# Patient Record
Sex: Male | Born: 1985 | Race: White | Hispanic: No | Marital: Single | Smoking: Former smoker
Health system: Southern US, Community
[De-identification: ages and names within clinical notes are randomized; demographics above are authoritative.]

## PROBLEM LIST (undated history)

## (undated) DIAGNOSIS — F209 Schizophrenia, unspecified: Secondary | ICD-10-CM

## (undated) DIAGNOSIS — F419 Anxiety disorder, unspecified: Secondary | ICD-10-CM

## (undated) DIAGNOSIS — F319 Bipolar disorder, unspecified: Secondary | ICD-10-CM

## (undated) HISTORY — DX: Anxiety disorder, unspecified: F41.9

---
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
## 2010-10-06 ENCOUNTER — Emergency Department (HOSPITAL_COMMUNITY)
Admission: EM | Admit: 2010-10-06 | Discharge: 2010-10-06 | Disposition: A | Discharge status: Home / Self Care | Payer: BC Managed Care – PPO | Attending: Emergency Medicine | Admitting: Emergency Medicine

## 2010-10-06 ENCOUNTER — Encounter (HOSPITAL_COMMUNITY): Payer: Self-pay

## 2010-10-06 ENCOUNTER — Emergency Department (HOSPITAL_COMMUNITY): Payer: BC Managed Care – PPO

## 2010-10-06 DIAGNOSIS — R197 Diarrhea, unspecified: Secondary | ICD-10-CM | POA: Insufficient documentation

## 2010-10-06 DIAGNOSIS — R1031 Right lower quadrant pain: Secondary | ICD-10-CM | POA: Insufficient documentation

## 2010-10-06 DIAGNOSIS — R110 Nausea: Secondary | ICD-10-CM | POA: Insufficient documentation

## 2010-10-06 LAB — DIFFERENTIAL
Basophils Absolute: 0.1 10*3/uL (ref 0.0–0.1)
Basophils Relative: 1 % (ref 0–1)
Eosinophils Absolute: 0 10*3/uL (ref 0.0–0.7)
Eosinophils Relative: 0 % (ref 0–5)
Lymphocytes Relative: 13 % (ref 12–46)
Lymphs Abs: 1.2 10*3/uL (ref 0.7–4.0)
Monocytes Absolute: 0.6 10*3/uL (ref 0.1–1.0)
Monocytes Relative: 6 % (ref 3–12)
Neutro Abs: 7.6 10*3/uL (ref 1.7–7.7)
Neutrophils Relative %: 80 % — ABNORMAL HIGH (ref 43–77)

## 2010-10-06 LAB — CBC
HCT: 45.6 % (ref 39.0–52.0)
Hemoglobin: 16.1 g/dL (ref 13.0–17.0)
MCH: 30.8 pg (ref 26.0–34.0)
MCHC: 35.3 g/dL (ref 30.0–36.0)
MCV: 87.2 fL (ref 78.0–100.0)
Platelets: 262 10*3/uL (ref 150–400)
RBC: 5.23 MIL/uL (ref 4.22–5.81)
RDW: 12.1 % (ref 11.5–15.5)
WBC: 9.5 10*3/uL (ref 4.0–10.5)

## 2010-10-06 LAB — URINALYSIS, ROUTINE W REFLEX MICROSCOPIC
Bilirubin Urine: NEGATIVE
Glucose, UA: NEGATIVE mg/dL
Hgb urine dipstick: NEGATIVE
Ketones, ur: 15 mg/dL — AB
Leukocytes, UA: NEGATIVE
Nitrite: NEGATIVE
Protein, ur: NEGATIVE mg/dL
Specific Gravity, Urine: 1.028 (ref 1.005–1.030)
Urobilinogen, UA: 0.2 mg/dL (ref 0.0–1.0)
pH: 6.5 (ref 5.0–8.0)

## 2010-10-06 LAB — COMPREHENSIVE METABOLIC PANEL
Albumin: 5.2 g/dL (ref 3.5–5.2)
BUN: 11 mg/dL (ref 6–23)
Calcium: 10.8 mg/dL — ABNORMAL HIGH (ref 8.4–10.5)
Creatinine, Ser: 0.81 mg/dL (ref 0.50–1.35)
GFR calc Af Amer: >60 mL/min (ref >60)
Potassium: 4.5 mEq/L (ref 3.5–5.1)
Total Protein: 8.2 g/dL (ref 6.0–8.3)

## 2010-10-06 MED ORDER — IOHEXOL 300 MG/ML  SOLN
100.0000 mL | Freq: Once | INTRAMUSCULAR | Status: AC | PRN
  Administered 2010-10-06: 100 mL via INTRAVENOUS

## 2010-10-19 ENCOUNTER — Encounter: Payer: Self-pay | Admitting: Internal Medicine

## 2010-10-19 ENCOUNTER — Ambulatory Visit (INDEPENDENT_AMBULATORY_CARE_PROVIDER_SITE_OTHER): Payer: BC Managed Care – PPO | Admitting: Internal Medicine

## 2010-10-19 VITALS — BP 124/62 | HR 88 | Ht 70.0 in | Wt 164.0 lb

## 2010-10-19 DIAGNOSIS — R1031 Right lower quadrant pain: Secondary | ICD-10-CM

## 2010-10-19 MED ORDER — METHYLCELLULOSE (LAXATIVE) PO POWD: 1.0000 | Freq: Every day | ORAL | Status: AC

## 2010-10-19 NOTE — Patient Instructions (Signed)
Please begin Citrucel 1 tablespoon daily for 2 weeks, if no better you may start Miralax 1 scoop daily in 8 oz of water. Increase your fluid intake. Follow up in 1 month.

## 2010-10-19 NOTE — Progress Notes (Signed)
Subjective:    Patient ID: Gilbert Meyers, male    DOB: 11/01/1985, 25 y.o.   MRN: [id number]  HPI Gilbert Meyers is a 25 year old male with no past medical history who presents for evaluation of intermittent right lower quadrant pain.  The patient reports approximately 3 weeks of right lower quadrant pain and change in bowel habits.  He reports intermittent sharp right lower quadrant pain. This has been occurring 2-3 times a day and lasts only for a few seconds. He does report a "dull ache" at times which is unpredictable and also short lived. He reports that this doesn't seem to be related to eating or to BM and there is no other aggravating or alleviating factors. The pain does not radiate. He has "a little" nausea but no vomiting. He reports his appetite has remained very good he's had no weight loss. He's had no fevers or chills. Regarding his bowel movements he states prior to 3 or 4 weeks ago he would go one to 2 times/day and his stools were brown and formed. He reports now having one stool per day or perhaps less and he notes a change in color to yellow. He also has noticed mucus in his stools. He reports his stools have been harder. No diarrhea. No rectal bleeding or melena.  No heartburn, no odynophagia or dysphagia. He also is noted no recent change in diet. He has not taken anything for his pain.  Review of Systems  Constitutional: Negative for fever, chills, activity change, appetite change, fatigue and unexpected weight change.  HENT: Negative for sore throat and trouble swallowing.   Eyes: Negative for visual disturbance.  Respiratory: Negative for cough, chest tightness and shortness of breath.   Cardiovascular: Negative for chest pain and leg swelling.  Gastrointestinal:       See HPI   Genitourinary: Negative for dysuria and difficulty urinating.  Musculoskeletal: Negative for myalgias and arthralgias.  Skin: Negative for color change and rash.  Neurological: Negative for weakness,  numbness and headaches.  Hematological: Does not bruise/bleed easily.  Psychiatric/Behavioral: Negative for dysphoric mood. The patient is nervous/anxious.        Anxiety related to change in BM/pain   SH: The patient is single. He recently quit smoking cigarettes about 4 months ago. Prior to this he smoked about 10 cigarettes per day for 7 years. He reports rare and inconsistent alcohol use. Denies illicit drug use. 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  FH: The patient reports a paternal aunt with colitis, a paternal grandfather with prostate cancer. No known history of colorectal cancer colon polyps.    Objective:   Physical Exam  Constitutional: He is oriented to person, place, and time. He appears well-developed and well-nourished. No distress.  HENT:  Head: Normocephalic and atraumatic.  Mouth/Throat: Oropharynx is clear and moist. No oropharyngeal exudate.  Eyes: Conjunctivae and EOM are normal. Pupils are equal, round, and reactive to light. No scleral icterus.  Neck: Neck supple.  Cardiovascular: Normal rate, regular rhythm and normal heart sounds.   Pulmonary/Chest: Effort normal and breath sounds normal. He has no wheezes.  Abdominal: Soft. Bowel sounds are normal. He exhibits no distension and no mass. There is no tenderness.  Lymphadenopathy:    He has no cervical adenopathy.  Neurological: He is alert and oriented to person, place, and time.  Skin: Skin is warm and dry. No rash noted.  Psychiatric: He has a normal mood and affect. His  behavior is normal.  BP 124/62  Pulse 88  Ht 5\' 10"  (1.778 m)  Wt 164 lb (74.39 kg)  BMI 23.53 kg/m2  Imaging reviewed: CT scan abdomen and pelvis 10/06/2010 Liver, spleen, pancreas, and adrenal glands are within normal  limits.  Gallbladder is unremarkable. No intrahepatic or extrahepatic  ductal dilatation.  Kidneys within normal limits. No hydronephrosis.  No evidence of bowel obstruction. Normal  appendix. Terminal ileum  appears within normal limits.  No abdominopelvic ascites. No suspicious abdominopelvic  lymphadenopathy.  No evidence of abdominal aortic aneurysm.  Prostate is unremarkable.  Bladder is within normal limits.  Visualized osseous structures are within normal limits.  IMPRESSION:  Normal appendix.  No evidence of bowel obstruction.  No CT findings to account for the patient's abdominal pain.  Labs reviewed: CMET     Component Value Date/Time   NA 139 10/06/2010 2030   K 4.5 10/06/2010 2030   CL 101 10/06/2010 2030   CO2 27 10/06/2010 2030   GLUCOSE 104* 10/06/2010 2030   BUN 11 10/06/2010 2030   CREATININE 0.81 10/06/2010 2030   CALCIUM 10.8* 10/06/2010 2030   PROT 8.2 10/06/2010 2030   ALBUMIN 5.2 10/06/2010 2030   AST 20 10/06/2010 2030   ALT 17 10/06/2010 2030   ALKPHOS 74 10/06/2010 2030   BILITOT 2.1* 10/06/2010 2030   GFRNONAA >60 10/06/2010 2030   GFRAA >60 10/06/2010 2030   CBC    Component Value Date/Time   WBC 9.5 10/06/2010 2030   RBC 5.23 10/06/2010 2030   HGB 16.1 10/06/2010 2030   HCT 45.6 10/06/2010 2030   PLT 262 10/06/2010 2030   MCV 87.2 10/06/2010 2030   MCH 30.8 10/06/2010 2030   MCHC 35.3 10/06/2010 2030   RDW 12.1 10/06/2010 2030   LYMPHSABS 1.2 10/06/2010 2030   MONOABS 0.6 10/06/2010 2030   EOSABS 0.0 10/06/2010 2030   BASOSABS 0.1 10/06/2010 2030      Assessment & Plan:  This is a 25 year old male with no past medical history presenting with intermittent right lower quadrant pain and hard stools with mucus.  1. Abd pain/change in stools - the repeat studies including labs and CT imaging are reassuring. At this point I think the most likely etiology is slow bowel transit and perhaps some pain due to constipation.  There are no alarm symptoms currently.  There may be a component of IBS but I feel it is too early to know for sure.  I will give him a trial of Citrucel daily and I have asked that he try to increase his fluid intake in an attempt to bulk his stools. I advised  her that if after 14 days he continues to have hard and less frequent stools that he should start MiraLAX 17 g daily. I will see him back in 4 weeks' time to gauge response. If there's been no improvement or symptoms change/worse then I will consider colonoscopy.  2. Elevated bilirubin - the patient had a slightly elevated bilirubin at 2.1 on 10/06/2010. He left today before getting labs, but we will recheck this at his next visit to ensure normalization. There is no jaundice currently and he denies dark urine.  If it remains elevated we will get a fractionated bilirubin as Gilbert's is possible.  Return in one month and call my office if symptoms change or worsen or if he has questions.

## 2012-06-15 ENCOUNTER — Emergency Department (HOSPITAL_COMMUNITY)
Admission: EM | Admit: 2012-06-15 | Discharge: 2012-06-15 | Disposition: A | Discharge status: Home / Self Care | Payer: Self-pay | Attending: Emergency Medicine | Admitting: Emergency Medicine

## 2012-06-15 ENCOUNTER — Encounter (HOSPITAL_COMMUNITY): Payer: Self-pay | Admitting: *Deleted

## 2012-06-15 DIAGNOSIS — F411 Generalized anxiety disorder: Secondary | ICD-10-CM | POA: Insufficient documentation

## 2012-06-15 DIAGNOSIS — F909 Attention-deficit hyperactivity disorder, unspecified type: Secondary | ICD-10-CM | POA: Insufficient documentation

## 2012-06-15 DIAGNOSIS — F419 Anxiety disorder, unspecified: Secondary | ICD-10-CM

## 2012-06-15 DIAGNOSIS — IMO0002 Reserved for concepts with insufficient information to code with codable children: Secondary | ICD-10-CM | POA: Insufficient documentation

## 2012-06-15 DIAGNOSIS — Z87891 Personal history of nicotine dependence: Secondary | ICD-10-CM | POA: Insufficient documentation

## 2012-06-15 MED ORDER — HYDROXYZINE HCL 10 MG PO TABS: 10.0000 mg | ORAL_TABLET | Freq: Four times a day (QID) | ORAL | Status: DC | PRN
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
## 2012-11-19 ENCOUNTER — Emergency Department (HOSPITAL_COMMUNITY)
Admission: EM | Admit: 2012-11-19 | Discharge: 2012-11-19 | Disposition: A | Discharge status: Home / Self Care | Payer: BC Managed Care – PPO | Attending: Emergency Medicine | Admitting: Emergency Medicine

## 2012-11-19 ENCOUNTER — Encounter (HOSPITAL_COMMUNITY): Payer: Self-pay | Admitting: *Deleted

## 2012-11-19 DIAGNOSIS — Z8659 Personal history of other mental and behavioral disorders: Secondary | ICD-10-CM | POA: Insufficient documentation

## 2012-11-19 DIAGNOSIS — H9209 Otalgia, unspecified ear: Secondary | ICD-10-CM | POA: Insufficient documentation

## 2012-11-19 DIAGNOSIS — H9201 Otalgia, right ear: Secondary | ICD-10-CM

## 2012-11-19 DIAGNOSIS — F17200 Nicotine dependence, unspecified, uncomplicated: Secondary | ICD-10-CM | POA: Insufficient documentation

## 2012-11-19 DIAGNOSIS — R599 Enlarged lymph nodes, unspecified: Secondary | ICD-10-CM | POA: Insufficient documentation

## 2012-11-19 MED ORDER — ANTIPYRINE-BENZOCAINE 5.4-1.4 % OT SOLN
3.0000 [drp] | Freq: Once | OTIC | Status: AC
  Administered 2012-11-19: 3 [drp] via OTIC
  Filled 2012-11-19: qty 40

## 2012-11-19 MED ORDER — PSEUDOEPHEDRINE HCL 60 MG PO TABS: 60.0000 mg | ORAL_TABLET | ORAL | Status: DC | PRN

## 2012-11-19 NOTE — ED Notes (Signed)
Pt c/o right ear pain.

## 2012-11-19 NOTE — ED Provider Notes (Signed)
CSN: [id number]     Arrival date & time 11/19/12  1547 History  This chart was scribed for non-physician practitioner, Junious Silk, PA-C working with Shelda Jakes, MD by Greggory Stallion, ED scribe. This patient was seen in room [facility]C/[facility]04C and the patient's care was started at 5:36 PM.   Chief Complaint  Patient presents with  . Otalgia   The history is provided by the patient. No language interpreter was used.    HPI Comments: Gilbert Meyers is a 27 y.o. male who presents to the Emergency Department complaining of gradual onset, constant sharp right otalgia that started 3 days ago. Pt has had a crackling noise in his neck for the last few days as well. He states he has a swollen lymph node in his neck for about 3 months. Pt has taken Tylenol and Advil with some relief. He denies dental problem, fever, congestion, rhinorrhea and cough. Pt does not have a PCP.   Past Medical History  Diagnosis Date  . Anxiety    History reviewed. No pertinent past surgical history. Family History  Problem Relation Age of Onset  . Colon cancer Neg Hx   . Prostate cancer Paternal Grandfather    History  Substance Use Topics  . Smoking status: Current Every Day Smoker  . Smokeless tobacco: Never Used  . Alcohol Use: No     Comment: Was a heavy drinker, quit 4 months ago     Review of Systems  Constitutional: Negative for fever.  HENT: Positive for ear pain. Negative for congestion, rhinorrhea and dental problem.   Respiratory: Negative for cough.   All other systems reviewed and are negative.    Allergies  Review of patient's allergies indicates no known allergies.  Home Medications   Current Outpatient Rx  Name  Route  Sig  Dispense  Refill  . Ibuprofen (IBU PO)   Oral   Take 3 tablets by mouth daily as needed (pain).          BP 125/91  Temp(Src) 98.2 F (36.8 C) (Oral)  Resp 18  SpO2 98%  Physical Exam  Nursing note and vitals reviewed. Constitutional: He is oriented  to person, place, and time. He appears well-developed and well-nourished. No distress.  HENT:  Head: Normocephalic and atraumatic.  Right Ear: Tympanic membrane and external ear normal.  Left Ear: Tympanic membrane and external ear normal.  Nose: Nose normal.  Uvula midline.   Eyes: Conjunctivae are normal.  Neck: Normal range of motion. No tracheal deviation present.  Cardiovascular: Normal rate, regular rhythm and normal heart sounds.   Pulmonary/Chest: Effort normal and breath sounds normal. No stridor. No respiratory distress. He has no wheezes. He has no rales.  Abdominal: Soft. He exhibits no distension. There is no tenderness.  Musculoskeletal: Normal range of motion.  Lymphadenopathy:    He has cervical adenopathy.  1 cm soft, mobile anterior cervical lymph node.  Neurological: He is alert and oriented to person, place, and time.  Skin: Skin is warm and dry. He is not diaphoretic.  Psychiatric: He has a normal mood and affect. His behavior is normal.    ED Course  Procedures (including critical care time)  DIAGNOSTIC STUDIES: Oxygen Saturation is 98% on RA, normal by my interpretation.    COORDINATION OF CARE: 5:43 PM-Discussed treatment plan which includes ear drops with pt at bedside and pt agreed to plan. Advised pt to follow up with a PCP so he could get a possible biopsy of his  lymph node.   Labs Review Labs Reviewed - No data to display Imaging Review No results found.  MDM   1. Otalgia, right   2. Enlarged lymph node    Patient with otalgia on right. No signs of infection. Given antipyrine-benzocaine drops in ED. Patient also complains of enlarged lymph node x 3 months. Small, mobile. Discussed importance of PCP follow up for possible biopsy of this lymph node. Treat symptomatically now. Afebrile. Return instructions given. Vital signs stable for discharge. Patient / Family / Caregiver informed of clinical course, understand medical decision-making process, and  agree with plan.      I personally performed the services described in this documentation, which was scribed in my presence. The recorded information has been reviewed and is accurate.    Mora Bellman, PA-C 11/20/12 [phone]

## 2012-11-19 NOTE — ED Notes (Signed)
Rt earache and rt jaw pain for 3-4 days

## 2012-11-21 NOTE — ED Provider Notes (Signed)
Medical screening examination/treatment/procedure(s) were performed by non-physician practitioner and as supervising physician I was immediately available for consultation/collaboration.   Geoff Dacanay W. Anise Harbin, MD 11/21/12 0859 

## 2013-03-22 ENCOUNTER — Encounter (HOSPITAL_COMMUNITY): Payer: Self-pay | Admitting: Emergency Medicine

## 2013-03-22 ENCOUNTER — Emergency Department (HOSPITAL_COMMUNITY)
Admission: EM | Admit: 2013-03-22 | Discharge: 2013-03-24 | Disposition: A | Discharge status: Other Acute Inpatient Hospital | Payer: BC Managed Care – PPO | Attending: Emergency Medicine | Admitting: Emergency Medicine

## 2013-03-22 DIAGNOSIS — M79609 Pain in unspecified limb: Secondary | ICD-10-CM | POA: Insufficient documentation

## 2013-03-22 DIAGNOSIS — F430 Acute stress reaction: Secondary | ICD-10-CM | POA: Insufficient documentation

## 2013-03-22 DIAGNOSIS — F39 Unspecified mood [affective] disorder: Secondary | ICD-10-CM

## 2013-03-22 DIAGNOSIS — F3110 Bipolar disorder, current episode manic without psychotic features, unspecified: Secondary | ICD-10-CM

## 2013-03-22 DIAGNOSIS — R109 Unspecified abdominal pain: Secondary | ICD-10-CM | POA: Insufficient documentation

## 2013-03-22 DIAGNOSIS — R4689 Other symptoms and signs involving appearance and behavior: Secondary | ICD-10-CM

## 2013-03-22 DIAGNOSIS — F17200 Nicotine dependence, unspecified, uncomplicated: Secondary | ICD-10-CM | POA: Insufficient documentation

## 2013-03-22 DIAGNOSIS — F911 Conduct disorder, childhood-onset type: Secondary | ICD-10-CM | POA: Insufficient documentation

## 2013-03-22 DIAGNOSIS — R443 Hallucinations, unspecified: Secondary | ICD-10-CM | POA: Insufficient documentation

## 2013-03-22 DIAGNOSIS — G4700 Insomnia, unspecified: Secondary | ICD-10-CM | POA: Insufficient documentation

## 2013-03-22 DIAGNOSIS — R079 Chest pain, unspecified: Secondary | ICD-10-CM | POA: Insufficient documentation

## 2013-03-22 HISTORY — DX: Bipolar disorder, unspecified: F31.9

## 2013-03-22 LAB — CBC
HCT: 43.2 % (ref 39.0–52.0)
Hemoglobin: 15 g/dL (ref 13.0–17.0)
MCH: 30.3 pg (ref 26.0–34.0)
MCHC: 34.7 g/dL (ref 30.0–36.0)
MCV: 87.3 fL (ref 78.0–100.0)
PLATELETS: 302 10*3/uL (ref 150–400)
RBC: 4.95 MIL/uL (ref 4.22–5.81)
RDW: 12.9 % (ref 11.5–15.5)
WBC: 8.3 10*3/uL (ref 4.0–10.5)

## 2013-03-22 LAB — SALICYLATE LEVEL

## 2013-03-22 LAB — COMPREHENSIVE METABOLIC PANEL
ALBUMIN: 4.2 g/dL (ref 3.5–5.2)
ALT: 35 U/L (ref 0–53)
AST: 24 U/L (ref 0–37)
Alkaline Phosphatase: 102 U/L (ref 39–117)
BUN: 17 mg/dL (ref 6–23)
CALCIUM: 9.1 mg/dL (ref 8.4–10.5)
CO2: 25 meq/L (ref 19–32)
CREATININE: 1.11 mg/dL (ref 0.50–1.35)
Chloride: 101 mEq/L (ref 96–112)
GFR calc Af Amer: >90 mL/min (ref >90)
GFR, EST NON AFRICAN AMERICAN: 90 mL/min — AB (ref >90)
Glucose, Bld: 100 mg/dL — ABNORMAL HIGH (ref 70–99)
Potassium: 4.2 mEq/L (ref 3.7–5.3)
Sodium: 141 mEq/L (ref 137–147)
TOTAL PROTEIN: 7.5 g/dL (ref 6.0–8.3)
Total Bilirubin: 1.5 mg/dL — ABNORMAL HIGH (ref 0.3–1.2)

## 2013-03-22 LAB — RAPID URINE DRUG SCREEN, HOSP PERFORMED
AMPHETAMINES: NOT DETECTED
Barbiturates: NOT DETECTED
Benzodiazepines: NOT DETECTED
Cocaine: NOT DETECTED
OPIATES: NOT DETECTED
Tetrahydrocannabinol: NOT DETECTED

## 2013-03-22 LAB — ETHANOL: Alcohol, Ethyl (B): <11 mg/dL (ref 0–11)

## 2013-03-22 LAB — ACETAMINOPHEN LEVEL: Acetaminophen (Tylenol), Serum: <15 ug/mL (ref 10–30)

## 2013-03-22 MED ORDER — ZOLPIDEM TARTRATE 5 MG PO TABS: 5.0000 mg | ORAL_TABLET | Freq: Every evening | ORAL | Status: DC | PRN

## 2013-03-22 MED ORDER — NICOTINE 21 MG/24HR TD PT24
21.0000 mg | MEDICATED_PATCH | Freq: Every day | TRANSDERMAL | Status: DC
Start: 2013-03-22 — End: 2013-03-25

## 2013-03-22 MED ORDER — IBUPROFEN 200 MG PO TABS: 600.0000 mg | ORAL_TABLET | Freq: Three times a day (TID) | ORAL | Status: DC | PRN

## 2013-03-22 MED ORDER — ALUM & MAG HYDROXIDE-SIMETH 200-200-20 MG/5ML PO SUSP: 30.0000 mL | ORAL | Status: DC | PRN

## 2013-03-22 MED ORDER — ACETAMINOPHEN 325 MG PO TABS: 650.0000 mg | ORAL_TABLET | ORAL | Status: DC | PRN

## 2013-03-22 MED ORDER — ONDANSETRON HCL 4 MG PO TABS: 4.0000 mg | ORAL_TABLET | Freq: Three times a day (TID) | ORAL | Status: DC | PRN

## 2013-03-22 MED ORDER — LORAZEPAM 1 MG PO TABS
1.0000 mg | ORAL_TABLET | Freq: Three times a day (TID) | ORAL | Status: DC | PRN
  Administered 2013-03-24: 1 mg via ORAL
  Filled 2013-03-22: qty 1

## 2013-03-22 NOTE — ED Notes (Signed)
Patient talking with ACT team .
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
## 2013-03-22 NOTE — ED Notes (Addendum)
Patients belongings are with the family, they will take them home. Security has wanded patient
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
## 2013-03-22 NOTE — ED Provider Notes (Signed)
Medical screening examination/treatment/procedure(s) were performed by non-physician practitioner and as supervising physician I was immediately available for consultation/collaboration.    Amany Rando L Aydia Maj, MD 03/22/13 1755 

## 2013-03-23 DIAGNOSIS — F39 Unspecified mood [affective] disorder: Secondary | ICD-10-CM

## 2013-03-23 DIAGNOSIS — G4700 Insomnia, unspecified: Secondary | ICD-10-CM

## 2013-03-23 DIAGNOSIS — F603 Borderline personality disorder: Secondary | ICD-10-CM

## 2013-03-23 MED ORDER — ZIPRASIDONE MESYLATE 20 MG IM SOLR: 10.0000 mg | Freq: Once | INTRAMUSCULAR | Status: DC
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
## 2013-03-23 NOTE — BHH Counselor (Addendum)
Dr. Gilmore LarocheAkhtar signed IVC paperwork. IVC notarized and faxed to [employer]. Fax confirmation received. Writer called Magistrate to check on receipt of fax but no one answering at [employer]. Pt hasn't been served yet.  Evette Cristalaroline Paige Kenyona Rena, [place] Assessment Counselor

## 2013-03-23 NOTE — ED Notes (Signed)
Patient also states that the medications that he has stopped taking makes him feel nauseated and bad and he does not want to feel like that. It makes him not be able to function with some normalness. Pt is willing to take another type of psychotropic medication but less debilitating. Maybe start off with one or two to ensure that nothing contraindicating against one another. Patient appears open to solution to these racing thoughts and feelings he has been experiencing. He wants to feel better.

## 2013-03-23 NOTE — ED Notes (Signed)
Spoke to patient in depth. Patient feels like he has disappointed his parents and friends, they are upset with him. Patient reflected to previous behavior in falsifying jobs on social media to make it appear like he has a job. 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 He said it makes him feel like he has been hired. He would not get any money from these false position he held. He does not want to lie like this anymore. He plans to cancel his social media accounts until he starts to feel better.

## 2013-03-24 DIAGNOSIS — F3110 Bipolar disorder, current episode manic without psychotic features, unspecified: Secondary | ICD-10-CM

## 2013-03-24 NOTE — BH Assessment (Signed)
Pt evaluated by Dr. Ladona Ridgelaylor and he recommends a possible discharge home but would like to obtain collateral information from patient's parents first. Writer contacted patient's father Verdell Carmineerri Beam [phone] and left a voicemail. Also, contacted patient's mother Floria RavelingDonna Faison [phone] and left a voicemail. Both parent's did not answer, therefore; this writer will follow up at a later time.

## 2013-03-24 NOTE — Discharge Instructions (Signed)
Mood Disorders  Mood disorders are conditions that affect the way a person feels emotionally. The main mood disorders include:  · Depression.  · Bipolar disorder.  · Dysthymia. Dysthymia is a mild, lasting (chronic) depression. Symptoms of dysthymia are similar to depression, but not as severe.  · Cyclothymia. Cyclothymia includes mood swings, but the highs and lows are not as severe as they are in bipolar disorder. Symptoms of cyclothymia are similar to those of bipolar disorder, but less extreme.  CAUSES   Mood disorders are probably caused by a combination of factors. People with mood disorders seem to have physical and chemical changes in their brains. Mood disorders run in families, so there may be genetic causes. Severe trauma or stressful life events may also increase the risk of mood disorders.   SYMPTOMS   Symptoms of mood disorders depend on the specific type of condition.  Depression symptoms include:  · Feeling sad, worthless, or hopeless.  · Negative thoughts.  · Inability to enjoy one's usual activities.  · Low energy.  · Sleeping too much or too little.  · Appetite changes.  · Crying.  · Concentration problems.  · Thoughts of harming oneself.  Bipolar disorder symptoms include:  · Periods of depression (see above symptoms).  · Mood swings, from sadness and depression, to abnormal elation and excitement.  · Periods of mania:  · Racing thoughts.  · Fast speech.  · Poor judgment, and careless, dangerous choices.  · Decreased need for sleep.  · Risky behavior.  · Difficulty concentrating.  · Irritability.  · Increased energy.  · Increased sex drive.  DIAGNOSIS   There are no blood tests or X-rays that can confirm a mood disorder. However, your caregiver may choose to run some tests to make sure that there is not another physical cause for your symptoms. A mood disorder is usually diagnosed after an in-depth interview with a caregiver.  TREATMENT   Mood disorders can be treated with one or more of the  following:  · Medicine. This may include antidepressants, mood-stabilizers, or anti-psychotics.  · Psychotherapy (talk therapy).  · Cognitive behavioral therapy. You are taught to recognize negative thoughts and behavior patterns, and replace them with healthy thoughts and behaviors.  · Electroconvulsive therapy. For very severe cases of deep depression, a series of treatments in which an electrical current is applied to the brain.  · Vagus nerve stimulation. A pulse of electricity is applied to a portion of the brain.  · Transcranial magnetic stimulation. Powerful magnets are placed on the head that produce electrical currents.  · Hospitalization. In severe situations, or when someone is having serious thoughts of harming him or herself, hospitalization may be necessary in order to keep the person safe. This is also done to quickly start and monitor treatment.  HOME CARE INSTRUCTIONS   · Take your medicine exactly as directed.  · Attend all of your therapy sessions.  · Try to eat regular, healthy meals.  · Exercise daily. Exercise may improve mood symptoms.  · Get good sleep.  · Do not drink alcohol or use pot or other drugs. These can worsen mood symptoms and cause anxiety and psychosis.  · Tell your caregiver if you develop any side effects, such as feeling sick to your stomach (nauseous), dry mouth, dizziness, constipation, drowsiness, tremor, weight gain, or sexual symptoms. He or she may suggest things you can do to improve symptoms.  · Learn ways to cope with the stress of having a   chronic illness. This includes yoga, meditation, tai chi, or participating in a support group.  · Drink enough water to keep your urine clear or pale yellow. Eat a high-fiber diet. These habits may help you avoid constipation from your medicine.  SEEK IMMEDIATE MEDICAL CARE IF:  · Your mood worsens.  · You have thoughts of hurting yourself or others.  · You cannot care for yourself.  · You develop the sensation of hearing or seeing  something that is not actually present (auditory or visual hallucinations).  · You develop abnormal thoughts.  Document Released: 12/17/2008 Document Revised: 05/14/2011 Document Reviewed: 12/17/2008  [employer]® Patient Information ©2014 [employer].
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
## 2013-03-24 NOTE — Consult Note (Signed)
Review of Systems  Constitutional: Negative.   HENT: Negative.   Eyes: Negative.   Respiratory: Negative.   Cardiovascular: Negative.   Gastrointestinal: Negative.   Genitourinary: Negative.   Skin: Negative.   Neurological: Negative.   Endo/Heme/Allergies: Negative.   Psychiatric/Behavioral: The patient is nervous/anxious and has insomnia.        Hypomanic

## 2013-03-24 NOTE — ED Notes (Signed)
Report called to [facility]

## 2013-03-24 NOTE — BH Assessment (Signed)
03-24-13 Patient accepted to [facility] by Dr. Jennet MaduroPucilowska. Nursing report # is [phone]. Patient placed under IVC and [facility].  *IVC faxed to the magistrate and awaiting custody order to be served to patient.
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
## 2013-03-24 NOTE — ED Notes (Signed)
Patient anxious. Reports anxiety 7/10, feeling of depression 2/10. Patient states that he feels "like I'm getting back on track". Feels that he will be able to focus and put a plan together for his life. Encouragement offered. Patient safety maintained, Q 15 checks continue.

## 2013-03-24 NOTE — Progress Notes (Signed)
Adult Psychoeducational Group Note  Date:  03/24/2013 Time:  3:06 PM  Group Topic/Focus:  Overcoming Stress:   The focus of this group is to define stress and help patients assess their triggers.  Participation Level:  Active  Participation Quality:  Appropriate, Attentive, Sharing and Supportive  Affect:  Appropriate  Cognitive:  Alert and Appropriate  Insight: Appropriate and Good  Engagement in Group:  Engaged  Modes of Intervention:  Discussion and Education  Additional Comments:  Pt appeared to understand the concept of group and was able to identify stressors in his life. Pt was engaged in group discussion, asking about the difference between healthy and unhealthy coping skills. Pt was able to give an example of a sign/symptom that he feels when he is stressed. Pt states that he becomes hopeless, overwhelmed and unable to concentrate when he is stress. Pt was asked to give an example of a coping skill that he would be able to use when he is discharged. Pt stated that he likes to listen to music when stressed. He also stated that he has become aware of the messages/lyrics that are in the music (rap). He stated he would like to become more conscious of this when picking out music.   [name], [name] [name] 03/24/2013, 3:06 PM

## 2013-03-24 NOTE — Progress Notes (Signed)
Pt's referral has been faxed to the following facilities with bed availability:  [facility] per Minerva AreolaEric beds available, [facility] per Olegario MessierKathy beds available, referral faxed [facility]- per [facility] beds available, [facility], referral faxed [facility]- per Gigi GinPeggy can fax, referral [facility]- [facility], referral faxed [facility]- no answer , referral faxed   Tomi BambergerMariya Anniyah Mood Disposition MHT

## 2013-03-24 NOTE — Progress Notes (Signed)
[facility] aware that patient is on his way.

## 2013-03-24 NOTE — ED Notes (Signed)
Patient alert. Watching tv quietly. Denies complaint outside of insomnia.  Encouragement offered.  Patient safety maintained, Q 15 checks continue.

## 2013-03-24 NOTE — Progress Notes (Signed)
[facility] provided pt with a [facility] Orange Card application, highlighting [facility].  °

## 2013-03-24 NOTE — ED Notes (Signed)
Called [facility] to give report, [facility] to call back to receive report, Sheriff called for transport.

## 2013-03-25 ENCOUNTER — Inpatient Hospital Stay: Payer: Self-pay | Admitting: Psychiatry

## 2013-03-26 NOTE — Discharge Summary (Signed)
Patient personally evaluated and agreed with

## 2013-03-26 NOTE — Consult Note (Signed)
Patient personally evaluated, note reviewed and agreed with 
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
## 2015-02-18 ENCOUNTER — Emergency Department (HOSPITAL_COMMUNITY): Payer: BC Managed Care – PPO

## 2015-02-18 ENCOUNTER — Encounter (HOSPITAL_COMMUNITY): Payer: Self-pay | Admitting: Emergency Medicine

## 2015-02-18 ENCOUNTER — Emergency Department (HOSPITAL_COMMUNITY)
Admission: EM | Admit: 2015-02-18 | Discharge: 2015-02-19 | Disposition: A | Discharge status: Home / Self Care | Payer: BC Managed Care – PPO | Attending: Emergency Medicine | Admitting: Emergency Medicine

## 2015-02-18 DIAGNOSIS — K2970 Gastritis, unspecified, without bleeding: Secondary | ICD-10-CM | POA: Diagnosis not present

## 2015-02-18 DIAGNOSIS — R443 Hallucinations, unspecified: Secondary | ICD-10-CM | POA: Diagnosis not present

## 2015-02-18 DIAGNOSIS — R1012 Left upper quadrant pain: Secondary | ICD-10-CM

## 2015-02-18 DIAGNOSIS — F17200 Nicotine dependence, unspecified, uncomplicated: Secondary | ICD-10-CM | POA: Diagnosis not present

## 2015-02-18 DIAGNOSIS — F419 Anxiety disorder, unspecified: Secondary | ICD-10-CM | POA: Insufficient documentation

## 2015-02-18 LAB — RAPID URINE DRUG SCREEN, HOSP PERFORMED
Amphetamines: NOT DETECTED
Barbiturates: NOT DETECTED
Benzodiazepines: NOT DETECTED
Cocaine: NOT DETECTED
Opiates: NOT DETECTED
Tetrahydrocannabinol: NOT DETECTED

## 2015-02-18 LAB — CBC WITH DIFFERENTIAL/PLATELET
Basophils Absolute: 0 10*3/uL (ref 0.0–0.1)
Basophils Relative: 0 %
Eosinophils Absolute: 0 10*3/uL (ref 0.0–0.7)
Eosinophils Relative: 0 %
HEMATOCRIT: 44.2 % (ref 39.0–52.0)
Hemoglobin: 14.9 g/dL (ref 13.0–17.0)
Lymphocytes Relative: 26 %
Lymphs Abs: 2.5 10*3/uL (ref 0.7–4.0)
MCH: 29.5 pg (ref 26.0–34.0)
MCHC: 33.7 g/dL (ref 30.0–36.0)
MCV: 87.5 fL (ref 78.0–100.0)
Monocytes Absolute: 0.7 10*3/uL (ref 0.1–1.0)
Monocytes Relative: 7 %
NEUTROS ABS: 6.2 10*3/uL (ref 1.7–7.7)
NEUTROS PCT: 67 %
Platelets: 275 10*3/uL (ref 150–400)
RBC: 5.05 MIL/uL (ref 4.22–5.81)
RDW: 13.2 % (ref 11.5–15.5)
WBC: 9.5 10*3/uL (ref 4.0–10.5)

## 2015-02-18 LAB — URINALYSIS, ROUTINE W REFLEX MICROSCOPIC
Glucose, UA: NEGATIVE mg/dL
Hgb urine dipstick: NEGATIVE
Ketones, ur: 15 mg/dL — AB
Leukocytes, UA: NEGATIVE
Nitrite: NEGATIVE
Protein, ur: 30 mg/dL — AB
Specific Gravity, Urine: 1.036 — ABNORMAL HIGH (ref 1.005–1.030)
pH: 6 (ref 5.0–8.0)

## 2015-02-18 LAB — COMPREHENSIVE METABOLIC PANEL
ALT: 32 U/L (ref 17–63)
AST: 25 U/L (ref 15–41)
Albumin: 4.3 g/dL (ref 3.5–5.0)
Alkaline Phosphatase: 90 U/L (ref 38–126)
Anion gap: 12 (ref 5–15)
BILIRUBIN TOTAL: 2.2 mg/dL — AB (ref 0.3–1.2)
BUN: 11 mg/dL (ref 6–20)
CO2: 22 mmol/L (ref 22–32)
Calcium: 9.3 mg/dL (ref 8.9–10.3)
Chloride: 104 mmol/L (ref 101–111)
Creatinine, Ser: 1.04 mg/dL (ref 0.61–1.24)
GFR calc Af Amer: >60 mL/min (ref >60)
GFR calc non Af Amer: >60 mL/min (ref >60)
GLUCOSE: 117 mg/dL — AB (ref 65–99)
POTASSIUM: 3.8 mmol/L (ref 3.5–5.1)
SODIUM: 138 mmol/L (ref 135–145)
Total Protein: 6.9 g/dL (ref 6.5–8.1)

## 2015-02-18 LAB — URINE MICROSCOPIC-ADD ON

## 2015-02-18 LAB — I-STAT TROPONIN, ED: Troponin i, poc: 0 ng/mL (ref 0.00–0.08)

## 2015-02-18 LAB — LIPASE, BLOOD: Lipase: 24 U/L (ref 11–51)

## 2015-02-18 LAB — ETHANOL: Alcohol, Ethyl (B): <5 mg/dL

## 2015-02-18 MED ORDER — ONDANSETRON HCL 4 MG PO TABS: 4.0000 mg | ORAL_TABLET | Freq: Three times a day (TID) | ORAL | Status: DC | PRN

## 2015-02-18 MED ORDER — PANTOPRAZOLE SODIUM 40 MG IV SOLR
40.0000 mg | Freq: Once | INTRAVENOUS | Status: AC
  Administered 2015-02-18: 40 mg via INTRAVENOUS
  Filled 2015-02-18: qty 40

## 2015-02-18 MED ORDER — ZOLPIDEM TARTRATE 5 MG PO TABS: 5.0000 mg | ORAL_TABLET | Freq: Every evening | ORAL | Status: DC | PRN

## 2015-02-18 MED ORDER — ACETAMINOPHEN 325 MG PO TABS
650.0000 mg | ORAL_TABLET | ORAL | Status: DC | PRN
  Administered 2015-02-18: 650 mg via ORAL
  Filled 2015-02-18: qty 2

## 2015-02-18 MED ORDER — SODIUM CHLORIDE 0.9 % IV BOLUS (SEPSIS)
1000.0000 mL | Freq: Once | INTRAVENOUS | Status: AC
  Administered 2015-02-18: 1000 mL via INTRAVENOUS

## 2015-02-18 MED ORDER — LORAZEPAM 2 MG/ML IJ SOLN
1.0000 mg | Freq: Once | INTRAMUSCULAR | Status: AC
  Administered 2015-02-18: 1 mg via INTRAVENOUS
  Filled 2015-02-18: qty 1

## 2015-02-18 MED ORDER — GI COCKTAIL ~~LOC~~
30.0000 mL | Freq: Once | ORAL | Status: AC
  Administered 2015-02-18: 30 mL via ORAL
  Filled 2015-02-18: qty 30

## 2015-02-18 MED ORDER — LORAZEPAM 1 MG PO TABS
1.0000 mg | ORAL_TABLET | Freq: Three times a day (TID) | ORAL | Status: DC | PRN
Start: 2015-02-18 — End: 2015-02-19

## 2015-02-18 MED ORDER — IBUPROFEN 400 MG PO TABS: 600.0000 mg | ORAL_TABLET | Freq: Three times a day (TID) | ORAL | Status: DC | PRN

## 2015-02-18 MED ORDER — ALUM & MAG HYDROXIDE-SIMETH 200-200-20 MG/5ML PO SUSP
30.0000 mL | ORAL | Status: DC | PRN
Start: 2015-02-18 — End: 2015-02-19

## 2015-02-18 NOTE — ED Notes (Signed)
TTS in progress 

## 2015-02-18 NOTE — BH Assessment (Signed)
Assessment Note  Gilbert Meyers is an 29 y.o. male who presented to [facility] complaining of a stomach ache. Pt states he ate some [employer] earlier and felt it was in his chest cavity. Pt reports that his visit to the ER "was more physical than mental." Pt lives in the home with both his mother and father. Pt denies any past or present hx of SI/HI/AVH/ Pt is a poor historian regarding his mental health hx. Pt appeared unable to recall the names, time, places, dates and services provided in previous mental health settings. Pt appeared apprehensive and cautious when answering assessment questions. Pt appeared to stare off into space several times during the assessment. The writer redirected him to the questions several times and noted that is would take a few minutes for him to respond. Pt appeared dazed and drowsy. Pt denied hearing voices as well as denied any active command hallucination and/or delusions. Pt states he has not taken his medication in almost two years. He states that it makes him feel like he can't function when he is on it. " It makes me feel sweaty and excelerates my heartbeat." He denies being unable to sleep and endorses sleeping 8 hrs a night. Pt denies any issues with appetites. Pt reports a hx of alcohol, marijuana and cocaine. Pt states his last use of cocaine was in 2016. He reports having snorted it but states he has only used once or twice. He endorses use of alcohol or "hard liquor" and reports he has not had anything to drink since 2011. He reports smoking marijuana and states that he stopped smoking in 2014.   Diagnosis: Deferred  Past Medical History:  Past Medical History  Diagnosis Date  . Anxiety   . Bipolar 1 disorder (HCC)     History reviewed. No pertinent past surgical history.  Family History:  Family History  Problem Relation Age of Onset  . Colon cancer Neg Hx   . Prostate cancer Paternal Grandfather     Social History:  reports that he has quit smoking. He  has never used smokeless tobacco. He reports that he does not drink alcohol or use illicit drugs.  Additional Social History:  Alcohol / Drug Use History of alcohol / drug use?: Yes Substance #1 Name of Substance 1: Alcohol- liquor 1 - Age of First Use: 17 1 - Amount (size/oz): 16 oz 1 - Frequency: "very rarely"  1 - Duration: 11 years 1 - Last Use / Amount: 04/2014 Substance #2 Name of Substance 2: Marijuana 2 - Age of First Use: 16 2 - Amount (size/oz): "Very little"  2 - Frequency: once every three days 2 - Duration: 11 years 2 - Last Use / Amount: 2015 Substance #3 Name of Substance 3: Cocaine - snorting 3 - Age of First Use: 18 3 - Amount (size/oz): "not very much"  3 - Frequency: once every 8 months 3 - Duration: 3 years 3 - Last Use / Amount: 2012  CIWA: CIWA-Ar BP: 127/87 mmHg Pulse Rate: 79 COWS:    Allergies: No Known Allergies  Home Medications:  (Not in a hospital admission)  OB/GYN Status:  No LMP for male patient.  General Assessment Data Location of Assessment: [facility] ED TTS Assessment: In system Is this a Tele or Face-to-Face Assessment?: Tele Assessment Is this an Initial Assessment or a Re-assessment for this encounter?: Initial Assessment Marital status: Single Is patient pregnant?: No Pregnancy Status: No Living Arrangements: Parent Aurther Loft and Floria Raveling-) Can pt return  to current living arrangement?: Yes Admission Status: Voluntary Is patient capable of signing voluntary admission?: Yes Referral Source: Self/Family/Friend Insurance type: BCBS  Medical Screening Exam [facility] Walk[facility] ONLY) Medical Exam completed: No  Crisis Care Plan Living Arrangements: Parent Aurther Loft(Terry and Floria Ravelingonna Okelley-) Name of Psychiatrist[place]; does not recall names Name of Therapist: Denies  Education Status Is patient currently in school?: No Highest grade of school patient has completed: [employer] Name of school: [employer]  Risk to self with the past 6 months Suicidal Ideation: No Has patient been a risk to self within the past 6 months prior to admission? : No Suicidal Intent: No Has patient had any suicidal intent within the past 6 months prior to admission? : No Is patient at risk for suicide?: No Suicidal Plan?: No Has patient had any suicidal plan within the past 6 months prior to admission? : No Access to Means: No (Denies weapons in the home) What has been your use of drugs/alcohol within the last 12 months?: Alcohol; Cocaine; marijuana. last use 04/2014 Previous Attempts/Gestures: No How many times?: 0 Other Self Harm Risks: 0 Family Suicide History: No Persecutory voices/beliefs?: No Depression: No Substance abuse history and/or treatment for substance abuse?: No Suicide prevention information given to non-admitted patients: Not applicable  Risk to Others within the past 6 months Homicidal Ideation: No Does patient have any lifetime risk of violence toward others beyond the six months prior to admission? : No Thoughts of Harm to Others: No Current Homicidal Intent: No Current Homicidal Plan: No Access to Homicidal Means: No History of harm to others?: No Assessment of Violence: None Noted Does patient have access to weapons?: No Criminal Charges Pending?: No Does patient have a court date: No Is patient on probation?: No  Psychosis Hallucinations: None noted Delusions: None noted  Mental Status Report Appearance/Hygiene: Unremarkable Eye Contact: Fair Motor Activity: Unremarkable Speech: Logical/coherent Level of Consciousness: Alert, Quiet/awake Mood: Apprehensive Affect: Appropriate to circumstance Anxiety Level: None Thought Processes: Coherent, Relevant Judgement: Unimpaired Orientation: Person, Place, Time, Situation Obsessive Compulsive Thoughts/Behaviors: None  Cognitive Functioning Concentration: Good Memory: Recent Intact, Remote Intact IQ:  Average Insight: Fair Impulse Control: Unable to Assess Appetite: Good Weight Loss: 0 Weight Gain: 0 Sleep: No Change Total Hours of Sleep: 8 Vegetative Symptoms: None  ADLScreening [facility] Assessment Services) Patient's cognitive ability adequate to safely complete daily activities?: Yes Patient able to express need for assistance with ADLs?: Yes Independently performs ADLs?: Yes (appropriate for developmental age)  Prior Inpatient Therapy Prior Inpatient Therapy: Yes Prior Therapy Dates: 2014 Prior Therapy Facilty/Provider(s): [facility] Reason for Treatment:  ("I can't even recall")  Prior Outpatient Therapy Prior Outpatient Therapy: Yes Prior Therapy Dates:  (unknown) Prior Therapy Facilty/Provider(s):  (Unable to recall) Reason for Treatment:  (Mental health) Does patient have an ACCT team?: No Does patient have Intensive In-House Services?  : No Does patient have [facility] services? : Yes Does patient have P4CC services?: No  ADL Screening (condition at time of admission) Patient's cognitive ability adequate to safely complete daily activities?: Yes Is the patient deaf or have difficulty hearing?: No Does the patient have difficulty seeing, even when wearing glasses/contacts?: No Does the patient have difficulty concentrating, remembering, or making decisions?: No Patient able to express need for assistance with ADLs?: Yes Does the patient have difficulty dressing or bathing?: No Independently performs ADLs?: Yes (appropriate for developmental age) Does the patient have difficulty walking or climbing stairs?: No Weakness of  Legs: None Weakness of Arms/Hands: None  Home Assistive Devices/Equipment Home Assistive Devices/Equipment: None  Therapy Consults (therapy consults require a physician order) PT Evaluation Needed: No OT Evalulation Needed: No SLP Evaluation Needed: No Abuse/Neglect Assessment (Assessment to be complete while patient is alone) Physical  Abuse: Denies Verbal Abuse: Denies Sexual Abuse: Denies Exploitation of patient/patient's resources: Denies Self-Neglect: Denies Values / Beliefs Cultural Requests During Hospitalization: None Spiritual Requests During Hospitalization: None Consults Spiritual Care Consult Needed: No Social Work Consult Needed: No Merchant navy officer (For Healthcare) Does patient have an advance directive?: No    Additional Information 1:1 In Past 12 Months?: No CIRT Risk: No Elopement Risk: No Does patient have medical clearance?: No     Disposition:  Disposition Initial Assessment Completed for this Encounter: Yes Disposition of Patient: Referred to Patient referred to: [facility] refused, Other (Comment) (Remain in ED overnight to be re-evaluated in the am per Laur)  On Site Evaluation by:   Reviewed with Physician:  Remain in ED to be evaluated in the am per Fransisca Kaufmann, DNP  Levone Otten 02/18/2015 3:07 PM

## 2015-02-18 NOTE — ED Notes (Signed)
Patient placed on monitor

## 2015-02-18 NOTE — ED Notes (Signed)
Pt here from home stating that he ate at [employer] on wed night and has had left upper abd pain since that time a, has taken some advil and zantac

## 2015-02-18 NOTE — ED Notes (Signed)
Pt up to desk, pleasantly talking with staff.

## 2015-02-18 NOTE — ED Notes (Signed)
Returned from xray

## 2015-02-18 NOTE — ED Notes (Signed)
Pt ambulatory to room.

## 2015-02-18 NOTE — ED Notes (Signed)
Patient placed back on monitor, continuous pulse oximetry and blood pressure cuff; visitor at  bedside 

## 2015-02-18 NOTE — Discharge Instructions (Signed)
Your abdominal pain is likely from gastritis or an ulcer. You will need to take over the counter zantac daily until symptoms resolve, and avoid spicy/fatty/acidic foods. Avoid laying down flat within 30 minutes of eating. Avoid NSAIDs like ibuprofen on an empty stomach. Use tylenol as needed for pain. Use over the counter tums or maalox as needed for relief. Follow up with the gastroenterologist in one week for ongoing evaluation of your abdominal pain. Return to the ER for changes or worsening symptoms.  Abdominal (belly) pain can be caused by many things. Your caregiver performed an examination and possibly ordered blood/urine tests and imaging (CT scan, x-rays, ultrasound). Many cases can be observed and treated at home after initial evaluation in the emergency department. Even though you are being discharged home, abdominal pain can be unpredictable. Therefore, you need a repeated exam if your pain does not resolve, returns, or worsens. Most patients with abdominal pain don't have to be admitted to the hospital or have surgery, but serious problems like appendicitis and gallbladder attacks can start out as nonspecific pain. Many abdominal conditions cannot be diagnosed in one visit, so follow-up evaluations are very important. SEEK IMMEDIATE MEDICAL ATTENTION IF YOU DEVELOP ANY OF THE FOLLOWING SYMPTOMS:  The pain does not go away or becomes severe.   A temperature above 101 develops.   Repeated vomiting occurs (multiple episodes).   The pain becomes localized to portions of the abdomen. The right side could possibly be appendicitis. In an adult, the left lower portion of the abdomen could be colitis or diverticulitis.   Blood is being passed in stools or vomit (bright red or black tarry stools).   Return also if you develop chest pain, difficulty breathing, dizziness or fainting, or become confused, poorly responsive, or inconsolable (young children).  The constipation stays for more than 4  days.   There is belly (abdominal) or rectal pain.   You do not seem to be getting better.      Abdominal Pain, Adult Many things can cause belly (abdominal) pain. Most times, the belly pain is not dangerous. Many cases of belly pain can be watched and treated at home. HOME CARE   Do not take medicines that help you go poop (laxatives) unless told to by your doctor.  Only take medicine as told by your doctor.  Eat or drink as told by your doctor. Your doctor will tell you if you should be on a special diet. GET HELP IF:  You do not know what is causing your belly pain.  You have belly pain while you are sick to your stomach (nauseous) or have runny poop (diarrhea).  You have pain while you pee or poop.  Your belly pain wakes you up at night.  You have belly pain that gets worse or better when you eat.  You have belly pain that gets worse when you eat fatty foods.  You have a fever. GET HELP RIGHT AWAY IF:   The pain does not go away within 2 hours.  You keep throwing up (vomiting).  The pain changes and is only in the right or left part of the belly.  You have bloody or tarry looking poop. MAKE SURE YOU:   Understand these instructions.  Will watch your condition.  Will get help right away if you are not doing well or get worse.   This information is not intended to replace advice given to you by your health care provider. Make sure you discuss any  questions you have with your health care provider.   Document Released: 08/08/2007 Document Revised: 03/12/2014 Document Reviewed: 10/29/2012 Elsevier Interactive Patient Education 2016 Elsevier Inc.  Gastritis, Adult Gastritis is soreness and swelling (inflammation) of the lining of the stomach. Gastritis can develop as a sudden onset (acute) or long-term (chronic) condition. If gastritis is not treated, it can lead to stomach bleeding and ulcers. CAUSES  Gastritis occurs when the stomach lining is weak or damaged.  Digestive juices from the stomach then inflame the weakened stomach lining. The stomach lining may be weak or damaged due to viral or bacterial infections. One common bacterial infection is the Helicobacter pylori infection. Gastritis can also result from excessive alcohol consumption, taking certain medicines, or having too much acid in the stomach.  SYMPTOMS  In some cases, there are no symptoms. When symptoms are present, they may include:  Pain or a burning sensation in the upper abdomen.  Nausea.  Vomiting.  An uncomfortable feeling of fullness after eating. DIAGNOSIS  Your caregiver may suspect you have gastritis based on your symptoms and a physical exam. To determine the cause of your gastritis, your caregiver may perform the following:  Blood or stool tests to check for the H pylori bacterium.  Gastroscopy. A thin, flexible tube (endoscope) is passed down the esophagus and into the stomach. The endoscope has a light and camera on the end. Your caregiver uses the endoscope to view the inside of the stomach.  Taking a tissue sample (biopsy) from the stomach to examine under a microscope. TREATMENT  Depending on the cause of your gastritis, medicines may be prescribed. If you have a bacterial infection, such as an H pylori infection, antibiotics may be given. If your gastritis is caused by too much acid in the stomach, H2 blockers or antacids may be given. Your caregiver may recommend that you stop taking aspirin, ibuprofen, or other nonsteroidal anti-inflammatory drugs (NSAIDs). HOME CARE INSTRUCTIONS  Only take over-the-counter or prescription medicines as directed by your caregiver.  If you were given antibiotic medicines, take them as directed. Finish them even if you start to feel better.  Drink enough fluids to keep your urine clear or pale yellow.  Avoid foods and drinks that make your symptoms worse, such as:  Caffeine or alcoholic drinks.  Chocolate.  Peppermint or  mint flavorings.  Garlic and onions.  Spicy foods.  Citrus fruits, such as oranges, lemons, or limes.  Tomato-based foods such as sauce, chili, salsa, and pizza.  Fried and fatty foods.  Eat small, frequent meals instead of large meals. SEEK IMMEDIATE MEDICAL CARE IF:   You have black or dark red stools.  You vomit blood or material that looks like coffee grounds.  You are unable to keep fluids down.  Your abdominal pain gets worse.  You have a fever.  You do not feel better after 1 week.  You have any other questions or concerns. MAKE SURE YOU:  Understand these instructions.  Will watch your condition.  Will get help right away if you are not doing well or get worse.   This information is not intended to replace advice given to you by your health care provider. Make sure you discuss any questions you have with your health care provider.   Document Released: 02/13/2001 Document Revised: 08/21/2011 Document Reviewed: 04/04/2011 Elsevier Interactive Patient Education [employer]2016 Elsevier Inc.

## 2015-02-18 NOTE — ED Provider Notes (Signed)
CSN: [id number]     Arrival date & time 02/18/15  4098 History   First MD Initiated Contact with Patient 02/18/15 0930     Chief Complaint  Patient presents with  . Abdominal Pain     (Consider location/radiation/quality/duration/timing/severity/associated sxs/prior Treatment) HPI Comments: Gilbert Meyers is a 29 y.o. male with a PMHx of bipolar 1 disorder and anxiety, who presents to the ED with complaints of gradual onset left upper quadrant abdominal pain that began 2 days ago after eating [employer]. Patient describes pain as 5/10 sharp constant nonradiating pain in the left upper quadrant, with no known aggravating factors, and unrelieved with Advil and Zantac. He endorses chronic NSAID use. He denies any fevers, chills, chest pain, shortness of breath, nausea, vomiting, diarrhea, constipation, dysuria, hematuria, melena, hematochezia, obstipation, belching, numbness, tingling, or weakness. He denies any alcohol use, recent travel, or sick contacts.  Additionally he reports that he has been having auditory hallucinations, admits to not being on any of his psychiatric medications for more than 6 months. He denies any SI or HI. He states he feels somewhat anxious because he thinks something is wrong with his LUQ. States he hasn't slept in 36hrs. He denies any tobacco use or illicit drug use.   Patient is a 29 y.o. male presenting with abdominal pain. The history is provided by the patient and medical records. No language interpreter was used.  Abdominal Pain Pain location:  LUQ Pain quality: sharp   Pain radiates to:  Does not radiate Pain severity:  Mild Onset quality:  Gradual Duration:  2 days Timing:  Constant Progression:  Unchanged Chronicity:  New Context: suspicious food intake   Context: not recent travel and not sick contacts   Relieved by:  Nothing Worsened by:  Nothing tried Ineffective treatments:  Antacids and NSAIDs Associated symptoms: no chest pain, no chills, no  constipation, no diarrhea, no dysuria, no fever, no flatus, no hematemesis, no hematochezia, no hematuria, no melena, no nausea, no shortness of breath and no vomiting   Risk factors: NSAID use   Risk factors: no alcohol abuse     Past Medical History  Diagnosis Date  . Anxiety   . Bipolar 1 disorder (HCC)    History reviewed. No pertinent past surgical history. Family History  Problem Relation Age of Onset  . Colon cancer Neg Hx   . Prostate cancer Paternal Grandfather    Social History  Substance Use Topics  . Smoking status: Current Every Day Smoker  . Smokeless tobacco: Never Used  . Alcohol Use: No     Comment: Was a heavy drinker, quit 4 months ago     Review of Systems  Constitutional: Negative for fever and chills.  Respiratory: Negative for shortness of breath.   Cardiovascular: Negative for chest pain.  Gastrointestinal: Positive for abdominal pain. Negative for nausea, vomiting, diarrhea, constipation, blood in stool, melena, hematochezia, flatus and hematemesis.  Genitourinary: Negative for dysuria and hematuria.  Musculoskeletal: Negative for myalgias and arthralgias.  Skin: Negative for color change.  Allergic/Immunologic: Negative for immunocompromised state.  Neurological: Negative for weakness and numbness.  Psychiatric/Behavioral: Positive for hallucinations (auditory). Negative for suicidal ideas and confusion. The patient is nervous/anxious.    10 Systems reviewed and are negative for acute change except as noted in the HPI.    Allergies  Review of patient's allergies indicates no known allergies.  Home Medications   Prior to Admission medications   Not on File   BP 150/91 mmHg  Pulse 91  Temp(Src) 97.5 F (36.4 C) (Oral)  Resp 18  Ht  (1.778 m)  Wt 113.399 kg  BMI 35.87 kg/m2  SpO2 96% Physical Exam  Constitutional: He is oriented to person, place, and time. Vital signs are normal. He appears well-developed and well-nourished.   Non-toxic appearance. No distress.  Afebrile, nontoxic, NAD  HENT:  Head: Normocephalic and atraumatic.  Mouth/Throat: Oropharynx is clear and moist and mucous membranes are normal.  Eyes: Conjunctivae and EOM are normal. Right eye exhibits no discharge. Left eye exhibits no discharge.  Neck: Normal range of motion. Neck supple.  Cardiovascular: Normal rate, regular rhythm, normal heart sounds and intact distal pulses.  Exam reveals no gallop and no friction rub.   No murmur heard. Pulmonary/Chest: Effort normal and breath sounds normal. No respiratory distress. He has no decreased breath sounds. He has no wheezes. He has no rhonchi. He has no rales.  Abdominal: Soft. Normal appearance and bowel sounds are normal. He exhibits no distension. There is tenderness in the left upper quadrant. There is no rigidity, no rebound, no guarding, no CVA tenderness, no tenderness at McBurney's point and negative Murphy's sign.    Soft, nondistended, +BS throughout, with minimal LUQ TTP, no r/g/r, neg murphy's, neg mcburney's, no CVA TTP   Musculoskeletal: Normal range of motion.  Neurological: He is alert and oriented to person, place, and time. He has normal strength. No sensory deficit.  Skin: Skin is warm, dry and intact. No rash noted.  Psychiatric: His mood appears anxious. He is actively hallucinating (auditory). He expresses no homicidal and no suicidal ideation. He expresses no suicidal plans and no homicidal plans.  Endorses auditory hallucinations. Appears mildly anxious. No HI/SI. Pleasant and cooperative.  Nursing note and vitals reviewed.   ED Course  Procedures (including critical care time) Labs Review Labs Reviewed  COMPREHENSIVE METABOLIC PANEL - Abnormal; Notable for the following:    Glucose, Bld 117 (*)    Total Bilirubin 2.2 (*)    All other components within normal limits  URINALYSIS, ROUTINE W REFLEX MICROSCOPIC (NOT AT [facility]) - Abnormal; Notable for the following:    Color,  Urine AMBER (*)    APPearance CLOUDY (*)    Specific Gravity, Urine 1.036 (*)    Bilirubin Urine SMALL (*)    Ketones, ur 15 (*)    Protein, ur 30 (*)    All other components within normal limits  URINE MICROSCOPIC-ADD ON - Abnormal; Notable for the following:    Squamous Epithelial / LPF 0-5 (*)    Bacteria, UA RARE (*)    All other components within normal limits  CBC WITH DIFFERENTIAL/PLATELET  LIPASE, BLOOD  ETHANOL  URINE RAPID DRUG SCREEN, HOSP PERFORMED  I-STAT TROPOININ, ED    Imaging Review Dg Abd Acute W/chest  02/18/2015  CLINICAL DATA:  Abdominal pain EXAM: DG ABDOMEN ACUTE W/ 1V CHEST COMPARISON:  CT abdomen and pelvis October 06, 2010 FINDINGS: PA chest: Lungs are clear. Heart size and pulmonary vascularity are normal. No adenopathy. Supine and upright abdomen: There is moderate stool in the colon. There is no bowel dilatation or air-fluid level suggesting obstruction. No free air. There are small phleboliths in the pelvis. IMPRESSION: Bowel gas pattern unremarkable. No obstruction or free air. Lungs clear. Electronically Signed   By: Bretta Bang III M.D.   On: 02/18/2015 11:59   I have personally reviewed and evaluated these images and lab results as part of my medical decision-making.  EKG Interpretation   Date/Time:  Friday February 18 2015 10:06:28 EST Ventricular Rate:  80 PR Interval:  154 QRS Duration: 87 QT Interval:  362 QTC Calculation: 418 R Axis:   65 Text Interpretation:  Sinus rhythm Baseline wander in lead(s) V6 Otherwise  normal ECG no significant change since Jan 2015 Confirmed by Criss AlvineGOLDSTON  MD,  SCOTT ([id number]) on 02/18/2015 11:55:38 AM      MDM   Final diagnoses:  LUQ abdominal pain  Hallucination  Hyperbilirubinemia  Anxiety  Gastritis    29 y.o. male here with LUQ abd pain since eating [employer] 2 days ago. Also states he hasn't taken his psych meds in more than 6 months, hasn't slept in 36hrs, feels anxious that something is  wrong. On exam, mild LUQ abd tenderness, neg murphys, nonperitoneal. Doubt food bolus since pt is tolerating PO intake without regurgitation. Likely gastritis. No SI/HI but states he's having auditory hallucinations. Does not appear to be a threat to himself or others at this time. Will obtain labs, u/a, UDS, EtOH level, EKG, and troponin. Will get acute abd series. Will give protonix, ativan, fluids, and GI cocktail. Will reassess shortly.   11:44 AM Family at bedside, requesting possible TTS eval since he has taken himself off his meds and has not been sleeping, had hallucinations, has been somewhat more irritable and aggressive at times. Chart review reveals visit in Jan 2015 when he had similar requests/complaints, and TTS saw pt and felt he could benefit from psychiatric treatment. I feel this is reasonable. Pt currently asleep therefore will hold off on TTS consult until he is more awake. Labs reveal trop neg, CBC w/diff unremarkable, CMP with chronic hyperbilirubinemia unchanged from prior, lipase WNL, EtOH neg, still awaiting U/A and UDS as well as acute abd series. EKG with NSR and without ischemic changes, unchanged from prior. Will reassess shortly.  2:12 PM Pt still sleepy but arousable. UDS neg. U/A showing some dehydration but otherwise neg. Acute abd series unremarkable. Pt feels better, abd pain was likely gastritis. Will consult TTS since pt is medically cleared and is arousable enough to talk to them regarding possible psych placement. Will monitor.  2:19 PM TTS calling back, states they'll evaluate pt and then decide on plan. Will hold off on placing holding orders yet, until we have a clear decision on placement. Will continue to monitor.   3:19 PM Pt continues to feel better. TTS recommending ED obs overnight and reassess in the morning. Pt agreeable to this, here voluntarily and no IVC paperwork taken out, doubt need for this at this time since he doesn't appear acutely manic or a  danger to himself/others at this time. Discussed gastritis management, diet modifications, tylenol as needed for pain, zantac daily until his symptoms resolve, maalox or tums as needed, and GI f/up in 1-2 wks for ongoing management. He understands these instructions. [facility] for further documentation of care. Pt medically cleared.   BP 127/87 mmHg  Pulse 79  Temp(Src) 97.5 F (36.4 C) (Oral)  Resp 12  Ht 5\' 10"  (1.778 m)  Wt 113.399 kg  BMI 35.87 kg/m2  SpO2 99%  Meds ordered this encounter  Medications  . gi cocktail (Maalox,Lidocaine,Donnatal)    Sig:   . sodium chloride 0.9 % bolus 1,000 mL    Sig:   . LORazepam (ATIVAN) injection 1 mg    Sig:   . pantoprazole (PROTONIX) injection 40 mg    Sig:   . alum &  mag hydroxide-simeth (MAALOX/MYLANTA) 200-200-20 MG/5ML suspension 30 mL    Sig:   . ondansetron (ZOFRAN) tablet 4 mg    Sig:   . zolpidem (AMBIEN) tablet 5 mg    Sig:   . ibuprofen (ADVIL,MOTRIN) tablet 600 mg    Sig:   . acetaminophen (TYLENOL) tablet 650 mg    Sig:   . LORazepam (ATIVAN) tablet 1 mg    Sig:      Mckenleigh Tarlton Camprubi-Soms, PA-C 02/18/15 1521  Pricilla Loveless, MD 02/24/15 [phone]

## 2015-02-18 NOTE — ED Notes (Signed)
Patient handed an urinal to use; visitor stepping outside of door for privacy

## 2015-02-18 NOTE — ED Notes (Signed)
Patient made aware of need of urine specimen 

## 2015-02-18 NOTE — ED Notes (Signed)
Pt requesting narcotic  Pain medication by name (oxycodone, hydrocodone) for pain in abdomen.

## 2015-02-19 DIAGNOSIS — F419 Anxiety disorder, unspecified: Secondary | ICD-10-CM

## 2015-02-19 NOTE — ED Notes (Signed)
Pt at nurses' desk calling his mother to advise of pending d/c and for her to bring his clothing.

## 2015-02-19 NOTE — Progress Notes (Signed)
CSW went to speak with patient regarding plans for disposition. RN Becky at bedside. CSW introduced self and acknowledged the patient. Patient is alert and oriented x4. Patient informed CSW that "he feels good on today". Patient denies any SI/HI/AVH. Patient reports he would like to return home with his mother, and she is currently on her way to pick up patient. CSW provided patient with a list of inpatient and outpatient behavioral health centers. Patient was very appreciative of the resources provided by CSW. Patient questioned "Will these places provide me with some xanax"? CSW informed patient that there are providers at the facilities, however, they will determine what he needs. Patient stated "I will check out these places, [facility] CSW provided encouragement to patient. No further CSW needs were requested at this time. Patient to discharge home. CSW to sign off.   Please consult if further CSW needs arise.   Fernande BoydenJoyce Wing Schoch, LCSWA Clinical Social Worker [facility] Ph: [phone]

## 2015-02-19 NOTE — ED Notes (Signed)
Pt's mother arrived to pick up pt. 

## 2015-02-19 NOTE — ED Notes (Signed)
Alona BeneJoyce, SW, in w/pt giving outpt resources. Pt asking to go to a dr who will prescribe "Xanax". Alona BeneJoyce and RN advised pt the provider prescribes meds based on symptoms he is experiencing. Voiced understanding after much conversation w/pt.

## 2015-02-19 NOTE — ED Notes (Signed)
Pt at nurses' desk asking for "opioid pain medication prescription". RN advised pt will not be done. Pt asking repeatedly - stating he wants to be given the med so that he will have it to "take at home for about a week". Pt returned to room as asked.

## 2015-02-19 NOTE — ED Notes (Signed)
Patient is resting comfortably. 

## 2015-02-19 NOTE — ED Notes (Signed)
Pt came out of his room for a second time this evening to walk around the Pod C nursing station.  Rather than walk several laps, the pt immediately fixated on something in Pod D. We immediately called him back, and warned pt about privacy concerns.  Pt then came over to talk and the conversational topic changed dramatically. Pt mentioned his girlfriend, then his twitter break-up written in code that she may have understood, then suddenly switched topics again. He next shared his reason for coming to the ED, which was his extreme pain from "swallowing a large piece of taco meat that has hurt for days"  followed by his total lack of sleep for the past 4 to 5 days.  He kept smiling and laughing at his own comments, even those we could not hear.  After that he started undressing, removing his shirt, and asking if he could remove his electrodes.  I deferred to the RN who told pt to return to his room to undress.  He asked us to come with him so we did in hopes that he would remain there.  After the RN removed his dinner tray and noted a sticky film on the floor, we both used purple wipes to remove it with success.  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  He explained that "he prostituted himself and worked for free"  I tried to bring us back to some sense of reality by mentioning Sindy Guadeloupeoy Williams and if he knew him, and he again stated that he worked for free like a prostitute and was a Chief Strategy Officercoach via Twitter.  At this point I excused myself from the room and explained that I had other patients to see.  Thankfully, he remained in his room until he asked permission to use the restroom.  Permission was granted.  During one point in the conversation the pt expressed concerns that he "might spend the rest of his life in a psych facility if he shared this info  with a therapist or a physician. I had advised him to share his interesting story at tomorrow's TTS. This ED Tech believes that it would be of great benefit if someone accessed the pt's Twitter acct.  The pt was extremely proud of his prolific Tweets and his ability to stay awaike for many days. The pt became slightly defensive after sharing all of the above info in order to protect himself.

## 2015-02-19 NOTE — ED Notes (Signed)
States Risperidone is "too powerful" - "makes my brain real cloudy". States [facility] prescribing.

## 2015-02-19 NOTE — ED Notes (Signed)
Telepsych being performed. 

## 2015-02-19 NOTE — Consult Note (Signed)
Telepsych Consultation   Reason for Consult: Discharge Disposition  Referring Physician: Zacarias Pontes EDP Patient Identification: Gilbert Meyers MRN:  [id number] Principal Diagnosis: <principal problem not specified> Diagnosis:   Patient Active Problem List   Diagnosis Date Noted  . Anxiety [F41.9]   . Bipolar affective disorder, current episode manic ([place]) [F31.9] 03/24/2013    Total Time spent with patient: 45 minutes  Subjective:   Gilbert Meyers is a 29 y.o. male patient admitted with a stomach ache after eating at [facility] yesterday but has a history of Bipolar Disorder with medication noncompliance.   HPI:    Gilbert Meyers is an 29 y.o. male who presented to [facility] yesterday complaining of a stomach ache. Pt stated he ate some Janine Limbo earlier and felt it was in his chest cavity. He has a history of Bipolar Disorder but has been off medications for at least two years as he does not like the side effects. On presentation the patient reported his visit was more for physical reasons than mental. Patient has followed up with [facility] in the past but not recently. He denies any recent drug use and reports stopping marijuana use in 2014. His most recent urine drug screen is negative and his alcohol level is negative. Patient denies any recent mood symptoms to include manic or depressive episodes. He is focused on Camera operator several times for a week's worth of oxycodone, and it was explained that that medication is not indicated for his presenting complaint. Denies any active suicidal or homicidal ideation. The patient is able to fully participate in the assessment and does not appear to be experiencing any psychotic symptoms. Patient expresses desire to return home to live with parents. 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 Patient was very future oriented during the assessment speaking about getting a girlfriend and an apartment  eventually. He reported some intermittent anxiety for which he requested a prescription for xanax. Patient was informed that a medication such as vistaril could be prescribed from an outpatient Provider such as [facility] as it is not habit forming. Review of notes from his last admission to [facility] indicates that the patient requested ativan but was prescribed tegretol for mood control and vistaril for anxiety. Patient does not appear to be a danger to himself or others at this time.    HPI Elements:   Location:  Anxiety . Quality:  occasional when experiencing stress. Severity:  Mild. Timing:  Intermittent . Duration:  Varies. Context:  Reports stress from living with parents and not having his own place .  Past Medical History:  Past Medical History  Diagnosis Date  . Anxiety   . Bipolar 1 disorder ([place])    History reviewed. No pertinent past surgical history. Family History:  Family History  Problem Relation Age of Onset  . Colon cancer Neg Hx   . Prostate cancer Paternal Grandfather    Social History:  History  Alcohol Use No    Comment: Was a heavy drinker, quit 4 months ago      History  Drug Use No    Social History   Social History  . Marital Status: Single    Spouse Name: N/A  . Number of Children: 0  . Years of Education: N/A   Occupational History  . Unemployed     Social History Main Topics  . Smoking status: Former Research scientist (life sciences)  . Smokeless tobacco: Never Used  .  Alcohol Use: No     Comment: Was a heavy drinker, quit 4 months ago   . Drug Use: No  . Sexual Activity: Not Currently   Other Topics Concern  . None   Social History Narrative   Additional Social History:    History of alcohol / drug use?: Yes Name of Substance 1: Alcohol- liquor 1 - Age of First Use: 17 1 - Amount (size/oz): 16 oz 1 - Frequency: "very rarely"  1 - Duration: 11 years 1 - Last Use / Amount: 04/2014 Name of Substance 2: Marijuana 2 - Age of First Use: 16 2 - Amount (size/oz):  "Very little"  2 - Frequency: once every three days 2 - Duration: 11 years 2 - Last Use / Amount: 2015 Name of Substance 3: Cocaine - snorting 3 - Age of First Use: 18 3 - Amount (size/oz): "not very much"  3 - Frequency: once every 8 months 3 - Duration: 3 years 3 - Last Use / Amount: 2012               Allergies:  No Known Allergies  Labs:  Results for orders placed or performed during the hospital encounter of 02/18/15 (from the past 48 hour(s))  CBC with Differential     Status: None   Collection Time: 02/18/15 10:00 AM  Result Value Ref Range   WBC 9.5 4.0 - 10.5 K/uL   RBC 5.05 4.22 - 5.81 MIL/uL   Hemoglobin 14.9 13.0 - 17.0 g/dL   HCT 44.2 39.0 - 52.0 %   MCV 87.5 78.0 - 100.0 fL   MCH 29.5 26.0 - 34.0 pg   MCHC 33.7 30.0 - 36.0 g/dL   RDW 13.2 11.5 - 15.5 %   Platelets 275 150 - 400 K/uL   Neutrophils Relative % 67 %   Neutro Abs 6.2 1.7 - 7.7 K/uL   Lymphocytes Relative 26 %   Lymphs Abs 2.5 0.7 - 4.0 K/uL   Monocytes Relative 7 %   Monocytes Absolute 0.7 0.1 - 1.0 K/uL   Eosinophils Relative 0 %   Eosinophils Absolute 0.0 0.0 - 0.7 K/uL   Basophils Relative 0 %   Basophils Absolute 0.0 0.0 - 0.1 K/uL  Comprehensive metabolic panel     Status: Abnormal   Collection Time: 02/18/15 10:00 AM  Result Value Ref Range   Sodium 138 135 - 145 mmol/L   Potassium 3.8 3.5 - 5.1 mmol/L   Chloride 104 101 - 111 mmol/L   CO2 22 22 - 32 mmol/L   Glucose, Bld 117 (H) 65 - 99 mg/dL   BUN 11 6 - 20 mg/dL   Creatinine, Ser 1.04 0.61 - 1.24 mg/dL   Calcium 9.3 8.9 - 10.3 mg/dL   Total Protein 6.9 6.5 - 8.1 g/dL   Albumin 4.3 3.5 - 5.0 g/dL   AST 25 15 - 41 U/L   ALT 32 17 - 63 U/L   Alkaline Phosphatase 90 38 - 126 U/L   Total Bilirubin 2.2 (H) 0.3 - 1.2 mg/dL   GFR calc non Af Amer >60 >60 mL/min   GFR calc Af Amer >60 >60 mL/min    Comment: (NOTE) The eGFR has been calculated using the CKD EPI equation. This calculation has not been validated in all clinical  situations. eGFR's persistently <60 mL/min signify possible Chronic Kidney Disease.    Anion gap 12 5 - 15  Lipase, blood     Status: None   Collection Time: 02/18/15  10:00 AM  Result Value Ref Range   Lipase 24 11 - 51 U/L  Ethanol     Status: None   Collection Time: 02/18/15 10:00 AM  Result Value Ref Range   Alcohol, Ethyl (B) <5 <5 mg/dL    Comment:        LOWEST DETECTABLE LIMIT FOR SERUM ALCOHOL IS 5 mg/dL FOR MEDICAL PURPOSES ONLY   I-stat troponin, ED     Status: None   Collection Time: 02/18/15 10:09 AM  Result Value Ref Range   Troponin i, poc 0.00 0.00 - 0.08 ng/mL   Comment 3            Comment: Due to the release kinetics of cTnI, a negative result within the first hours of the onset of symptoms does not rule out myocardial infarction with certainty. If myocardial infarction is still suspected, repeat the test at appropriate intervals.   Urinalysis, Routine w reflex microscopic (not at [facility])     Status: Abnormal   Collection Time: 02/18/15  1:32 PM  Result Value Ref Range   Color, Urine AMBER (A) YELLOW    Comment: BIOCHEMICALS MAY BE AFFECTED BY COLOR   APPearance CLOUDY (A) CLEAR   Specific Gravity, Urine 1.036 (H) 1.005 - 1.030   pH 6.0 5.0 - 8.0   Glucose, UA NEGATIVE NEGATIVE mg/dL   Hgb urine dipstick NEGATIVE NEGATIVE   Bilirubin Urine SMALL (A) NEGATIVE   Ketones, ur 15 (A) NEGATIVE mg/dL   Protein, ur 30 (A) NEGATIVE mg/dL   Nitrite NEGATIVE NEGATIVE   Leukocytes, UA NEGATIVE NEGATIVE  Urine rapid drug screen (hosp performed)     Status: None   Collection Time: 02/18/15  1:32 PM  Result Value Ref Range   Opiates NONE DETECTED NONE DETECTED   Cocaine NONE DETECTED NONE DETECTED   Benzodiazepines NONE DETECTED NONE DETECTED   Amphetamines NONE DETECTED NONE DETECTED   Tetrahydrocannabinol NONE DETECTED NONE DETECTED   Barbiturates NONE DETECTED NONE DETECTED    Comment:        DRUG SCREEN FOR MEDICAL PURPOSES ONLY.  IF CONFIRMATION IS  NEEDED FOR ANY PURPOSE, NOTIFY LAB WITHIN 5 DAYS.        LOWEST DETECTABLE LIMITS FOR URINE DRUG SCREEN Drug Class       Cutoff (ng/mL) Amphetamine      1000 Barbiturate      200 Benzodiazepine   630 Tricyclics       160 Opiates          300 Cocaine          300 THC              50   Urine microscopic-add on     Status: Abnormal   Collection Time: 02/18/15  1:32 PM  Result Value Ref Range   Squamous Epithelial / LPF 0-5 (A) NONE SEEN   WBC, UA 0-5 0 - 5 WBC/hpf   RBC / HPF 0-5 0 - 5 RBC/hpf   Bacteria, UA RARE (A) NONE SEEN   Urine-Other MUCOUS PRESENT     Vitals: Blood pressure 129/68, pulse 64, temperature 98.1 F (36.7 C), temperature source Oral, resp. rate 16, height 5' 10" (1.778 m), weight 113.399 kg (250 lb), SpO2 97 %.  Risk to Self: Suicidal Ideation: No Suicidal Intent: No Is patient at risk for suicide?: No Suicidal Plan?: No Access to Means: No (Denies weapons in the home) What has been your use of drugs/alcohol within the last 12 months?: Alcohol;  Cocaine; marijuana. last use 04/2014 How many times?: 0 Other Self Harm Risks: 0 Risk to Others: Homicidal Ideation: No Thoughts of Harm to Others: No Current Homicidal Intent: No Current Homicidal Plan: No Access to Homicidal Means: No History of harm to others?: No Assessment of Violence: None Noted Does patient have access to weapons?: No Criminal Charges Pending?: No Does patient have a court date: No Prior Inpatient Therapy: Prior Inpatient Therapy: Yes Prior Therapy Dates: 2014 Prior Therapy Facilty/Provider(s): [place] Reason for Treatment:  ("I can't even recall") Prior Outpatient Therapy: Prior Outpatient Therapy: Yes Prior Therapy Dates:  (unknown) Prior Therapy Facilty/Provider(s):  (Unable to recall) Reason for Treatment:  (Mental health) Does patient have an ACCT team?: No Does patient have Intensive In-House Services?  : No Does patient have [facility] services? : Yes Does patient  have [facility] services?: No  Current Facility-Administered Medications  Medication Dose Route Frequency Provider Last Rate Last Dose  . acetaminophen (TYLENOL) tablet 650 mg  650 mg Oral Q4H PRN Mercedes Camprubi-Soms, PA-C   650 mg at 02/18/15 2021  . alum & mag hydroxide-simeth (MAALOX/MYLANTA) 200-200-20 MG/5ML suspension 30 mL  30 mL Oral PRN Mercedes Camprubi-Soms, PA-C      . LORazepam (ATIVAN) tablet 1 mg  1 mg Oral Q8H PRN Mercedes Camprubi-Soms, PA-C      . ondansetron (ZOFRAN) tablet 4 mg  4 mg Oral Q8H PRN Mercedes Camprubi-Soms, PA-C      . zolpidem (AMBIEN) tablet 5 mg  5 mg Oral QHS PRN Mercedes Camprubi-Soms, PA-C       Current Outpatient Prescriptions  Medication Sig Dispense Refill  . ferrous sulfate 325 (65 FE) MG tablet Take 325 mg by mouth daily with breakfast.    . Omega-3 Fatty Acids (FISH OIL PO) Take 1 capsule by mouth daily.    . vitamin C (ASCORBIC ACID) 500 MG tablet Take 500 mg by mouth daily.      Musculoskeletal: Strength & Muscle Tone: within normal limits Gait & Station: normal Patient leans: N/A  Psychiatric Specialty Exam: Physical Exam  Review of Systems  Psychiatric/Behavioral: Negative for depression, suicidal ideas, hallucinations, memory loss and substance abuse. The patient is not nervous/anxious and does not have insomnia.     Blood pressure 129/68, pulse 64, temperature 98.1 F (36.7 C), temperature source Oral, resp. rate 16, height 5' 10" (1.778 m), weight 113.399 kg (250 lb), SpO2 97 %.Body mass index is 35.87 kg/(m^2).  General Appearance: Casual  Eye Contact::  Good  Speech:  Clear and Coherent  Volume:  Normal  Mood:  Anxious  Affect:  Appropriate  Thought Process:  Goal Directed and Intact  Orientation:  Full (Time, Place, and Person)  Thought Content:  Rumination  Suicidal Thoughts:  No  Homicidal Thoughts:  No  Memory:  Immediate;   Good Recent;   Good Remote;   Good  Judgement:  Fair  Insight:  Shallow  Psychomotor Activity:   Normal  Concentration:  Good  Recall:  Good  Fund of Knowledge:Good  Language: Good  Akathisia:  No  Handed:  Right  AIMS (if indicated):     Assets:  Communication Skills Desire for Improvement Housing Leisure Time Physical Health Resilience Talents/Skills Vocational/Educational  ADL's:  Intact  Cognition: WNL  Sleep:      Medical Decision Making: Self-Limited or Minor (1), Review of Psycho-Social Stressors (1), Review or order clinical lab tests (1) and Review and summation of old records (2)  Treatment Plan Summary: Discharge to home  Plan:  No evidence of imminent risk to self or others at present.   Patient does not meet criteria for psychiatric inpatient admission. Supportive therapy provided about ongoing stressors. Discussed crisis plan, support from social network, calling 911, coming to the Emergency Department, and calling Suicide Hotline. Disposition: Discharge to home with outpatient resources for medication management and possible therapy   , , NP-C 02/19/2015 9:29 AM

## 2015-02-19 NOTE — ED Notes (Signed)
Pt vitals not obtained due to pt resting peacefully. Nurse notified.  

## 2015-03-24 ENCOUNTER — Encounter (HOSPITAL_COMMUNITY): Payer: Self-pay | Admitting: Emergency Medicine

## 2015-03-24 ENCOUNTER — Inpatient Hospital Stay
Admission: EM | Admit: 2015-03-24 | Discharge: 2015-03-29 | DRG: 885 | Disposition: A | Discharge status: Home / Self Care | Payer: No Typology Code available for payment source | Source: Intra-hospital | Attending: Psychiatry | Admitting: Psychiatry

## 2015-03-24 ENCOUNTER — Emergency Department (HOSPITAL_COMMUNITY)
Admission: EM | Admit: 2015-03-24 | Discharge: 2015-03-24 | Disposition: A | Discharge status: Other Acute Inpatient Hospital | Payer: BC Managed Care – PPO | Attending: Emergency Medicine | Admitting: Emergency Medicine

## 2015-03-24 DIAGNOSIS — R451 Restlessness and agitation: Secondary | ICD-10-CM | POA: Diagnosis present

## 2015-03-24 DIAGNOSIS — F22 Delusional disorders: Secondary | ICD-10-CM | POA: Diagnosis present

## 2015-03-24 DIAGNOSIS — F17210 Nicotine dependence, cigarettes, uncomplicated: Secondary | ICD-10-CM | POA: Diagnosis present

## 2015-03-24 DIAGNOSIS — F203 Undifferentiated schizophrenia: Secondary | ICD-10-CM | POA: Diagnosis present

## 2015-03-24 DIAGNOSIS — K219 Gastro-esophageal reflux disease without esophagitis: Secondary | ICD-10-CM | POA: Diagnosis present

## 2015-03-24 DIAGNOSIS — Z9114 Patient's other noncompliance with medication regimen: Secondary | ICD-10-CM | POA: Diagnosis not present

## 2015-03-24 DIAGNOSIS — F419 Anxiety disorder, unspecified: Secondary | ICD-10-CM | POA: Insufficient documentation

## 2015-03-24 DIAGNOSIS — Z8042 Family history of malignant neoplasm of prostate: Secondary | ICD-10-CM

## 2015-03-24 DIAGNOSIS — E785 Hyperlipidemia, unspecified: Secondary | ICD-10-CM | POA: Diagnosis present

## 2015-03-24 DIAGNOSIS — F3112 Bipolar disorder, current episode manic without psychotic features, moderate: Secondary | ICD-10-CM | POA: Insufficient documentation

## 2015-03-24 DIAGNOSIS — G479 Sleep disorder, unspecified: Secondary | ICD-10-CM | POA: Insufficient documentation

## 2015-03-24 DIAGNOSIS — G4700 Insomnia, unspecified: Secondary | ICD-10-CM | POA: Diagnosis present

## 2015-03-24 DIAGNOSIS — Z9119 Patient's noncompliance with other medical treatment and regimen: Secondary | ICD-10-CM | POA: Diagnosis not present

## 2015-03-24 DIAGNOSIS — Z818 Family history of other mental and behavioral disorders: Secondary | ICD-10-CM

## 2015-03-24 DIAGNOSIS — E8881 Metabolic syndrome: Secondary | ICD-10-CM | POA: Diagnosis present

## 2015-03-24 DIAGNOSIS — Z87891 Personal history of nicotine dependence: Secondary | ICD-10-CM | POA: Insufficient documentation

## 2015-03-24 DIAGNOSIS — Z79899 Other long term (current) drug therapy: Secondary | ICD-10-CM | POA: Insufficient documentation

## 2015-03-24 DIAGNOSIS — R443 Hallucinations, unspecified: Secondary | ICD-10-CM | POA: Insufficient documentation

## 2015-03-24 LAB — COMPREHENSIVE METABOLIC PANEL
ALT: 25 U/L (ref 17–63)
ANION GAP: 8 (ref 5–15)
AST: 21 U/L (ref 15–41)
Albumin: 4.3 g/dL (ref 3.5–5.0)
Alkaline Phosphatase: 90 U/L (ref 38–126)
BUN: 14 mg/dL (ref 6–20)
CALCIUM: 9.2 mg/dL (ref 8.9–10.3)
CHLORIDE: 107 mmol/L (ref 101–111)
CO2: 26 mmol/L (ref 22–32)
Creatinine, Ser: 0.97 mg/dL (ref 0.61–1.24)
GFR calc non Af Amer: >60 mL/min (ref >60)
GLUCOSE: 92 mg/dL (ref 65–99)
POTASSIUM: 3.9 mmol/L (ref 3.5–5.1)
SODIUM: 141 mmol/L (ref 135–145)
TOTAL PROTEIN: 7.3 g/dL (ref 6.5–8.1)
Total Bilirubin: 1.4 mg/dL — ABNORMAL HIGH (ref 0.3–1.2)

## 2015-03-24 LAB — RAPID URINE DRUG SCREEN, HOSP PERFORMED
Amphetamines: NOT DETECTED
BARBITURATES: NOT DETECTED
BENZODIAZEPINES: NOT DETECTED
Cocaine: NOT DETECTED
Opiates: NOT DETECTED
Tetrahydrocannabinol: NOT DETECTED

## 2015-03-24 LAB — ETHANOL

## 2015-03-24 LAB — CBC
HEMATOCRIT: 42.7 % (ref 39.0–52.0)
Hemoglobin: 14.9 g/dL (ref 13.0–17.0)
MCH: 30.7 pg (ref 26.0–34.0)
MCHC: 34.9 g/dL (ref 30.0–36.0)
MCV: 87.9 fL (ref 78.0–100.0)
PLATELETS: 274 10*3/uL (ref 150–400)
RBC: 4.86 MIL/uL (ref 4.22–5.81)
RDW: 13 % (ref 11.5–15.5)
WBC: 7.7 10*3/uL (ref 4.0–10.5)

## 2015-03-24 LAB — ACETAMINOPHEN LEVEL

## 2015-03-24 LAB — SALICYLATE LEVEL: Salicylate Lvl: <4 mg/dL (ref 2.8–30.0)

## 2015-03-24 MED ORDER — TRAZODONE HCL 100 MG PO TABS
100.0000 mg | ORAL_TABLET | Freq: Every evening | ORAL | Status: DC | PRN
  Administered 2015-03-24: 100 mg via ORAL
  Filled 2015-03-24: qty 1

## 2015-03-24 MED ORDER — HALOPERIDOL 5 MG PO TABS: 5.0000 mg | ORAL_TABLET | Freq: Three times a day (TID) | ORAL | Status: DC | PRN

## 2015-03-24 MED ORDER — IBUPROFEN 600 MG PO TABS
600.0000 mg | ORAL_TABLET | Freq: Four times a day (QID) | ORAL | Status: DC | PRN
  Administered 2015-03-24: 600 mg via ORAL
  Filled 2015-03-24: qty 1

## 2015-03-24 MED ORDER — ALUM & MAG HYDROXIDE-SIMETH 200-200-20 MG/5ML PO SUSP
30.0000 mL | ORAL | Status: DC | PRN
  Administered 2015-03-25: 30 mL via ORAL
  Filled 2015-03-24: qty 30

## 2015-03-24 MED ORDER — MAGNESIUM HYDROXIDE 400 MG/5ML PO SUSP: 30.0000 mL | Freq: Every day | ORAL | Status: DC | PRN

## 2015-03-24 MED ORDER — FERROUS SULFATE 325 (65 FE) MG PO TABS
325.0000 mg | ORAL_TABLET | Freq: Every day | ORAL | Status: DC
  Filled 2015-03-24 (×2): qty 1

## 2015-03-24 MED ORDER — DIPHENHYDRAMINE HCL 25 MG PO CAPS: 50.0000 mg | ORAL_CAPSULE | Freq: Three times a day (TID) | ORAL | Status: DC | PRN

## 2015-03-24 MED ORDER — ACETAMINOPHEN 325 MG PO TABS: 650.0000 mg | ORAL_TABLET | Freq: Four times a day (QID) | ORAL | Status: DC | PRN

## 2015-03-24 MED ORDER — PANTOPRAZOLE SODIUM 40 MG PO TBEC
40.0000 mg | DELAYED_RELEASE_TABLET | Freq: Every day | ORAL | Status: DC
  Administered 2015-03-25 – 2015-03-29 (×5): 40 mg via ORAL
  Filled 2015-03-24 (×5): qty 1

## 2015-03-24 MED ORDER — DIPHENHYDRAMINE HCL 50 MG/ML IJ SOLN
50.0000 mg | Freq: Three times a day (TID) | INTRAMUSCULAR | Status: DC | PRN
Start: 2015-03-24 — End: 2015-03-28

## 2015-03-24 MED ORDER — HALOPERIDOL LACTATE 5 MG/ML IJ SOLN: 5.0000 mg | Freq: Three times a day (TID) | INTRAMUSCULAR | Status: DC | PRN

## 2015-03-24 MED ORDER — PANTOPRAZOLE SODIUM 40 MG PO TBEC
40.0000 mg | DELAYED_RELEASE_TABLET | Freq: Every day | ORAL | Status: DC
  Administered 2015-03-24: 40 mg via ORAL
  Filled 2015-03-24: qty 1

## 2015-03-24 MED ORDER — ACETAMINOPHEN 500 MG PO TABS: 1000.0000 mg | ORAL_TABLET | Freq: Four times a day (QID) | ORAL | Status: DC | PRN

## 2015-03-24 MED ORDER — IBUPROFEN 200 MG PO TABS: 600.0000 mg | ORAL_TABLET | Freq: Four times a day (QID) | ORAL | Status: DC | PRN

## 2015-03-24 MED ORDER — FERROUS SULFATE 325 (65 FE) MG PO TABS
325.0000 mg | ORAL_TABLET | Freq: Every day | ORAL | Status: DC
  Administered 2015-03-25: 325 mg via ORAL
  Filled 2015-03-24 (×2): qty 1

## 2015-03-24 NOTE — ED Notes (Addendum)
Pt states that he just came from [facility] because his mind isn't right.  States that when he breathes through his nose, he feels like its not going into his "nasal passages but is going to the side of his nose.  States he has not been sleeping.  Adamantly denies SI/HI.  Denies substance abuse.  "The black part of my eyes is deep".

## 2015-03-24 NOTE — ED Provider Notes (Signed)
Pt has been accepted by Dr. Lucianne Muss at [facility] - will transfer  Gilbert Hong, MD 03/24/15 [phone]

## 2015-03-24 NOTE — ED Provider Notes (Signed)
CSN: [id number]     Arrival date & time 03/24/15  4098 History   First MD Initiated Contact with Patient 03/24/15 [phone]     Chief Complaint  Patient presents with  . Insomnia     (Consider location/radiation/quality/duration/timing/severity/associated sxs/prior Treatment) HPI Patient presents with his mother who assists with history of present illness. The patient has a history of bipolar disorder, anxiety. No medication use in about 6 months. Currently the patient complains of insomnia, having not slept in several days. Has complains of labile mood, irritability, agitation. Subsequently, the patient has tangential speech, describing visual disturbance, unusual breathing pattern, generalized discomfort. Today the patient was at [facility]. He felt uncomfortable with his evaluation there, left, is now here for evaluation. He denies any physical pain. He denies suicidal or homicidal ideation.  Past Medical History  Diagnosis Date  . Anxiety   . Bipolar 1 disorder ([facility])    No past surgical history on file. Family History  Problem Relation Age of Onset  . Colon cancer Neg Hx   . Prostate cancer Paternal Grandfather    Social History  Substance Use Topics  . Smoking status: Former Games developer  . Smokeless tobacco: Never Used  . Alcohol Use: No     Comment: Was a heavy drinker, quit 4 months ago     Review of Systems  Constitutional:       Per HPI, otherwise negative  HENT:       Per HPI, otherwise negative  Respiratory:       Per HPI, otherwise negative  Cardiovascular:       Per HPI, otherwise negative  Gastrointestinal: Negative for vomiting.  Endocrine:       Negative aside from HPI  Genitourinary:       Neg aside from HPI   Musculoskeletal:       Per HPI, otherwise negative  Skin: Negative.   Neurological: Negative for syncope.  Psychiatric/Behavioral: Positive for hallucinations, behavioral problems, sleep disturbance, dysphoric mood, decreased concentration and  agitation. Negative for suicidal ideas and self-injury. The patient is nervous/anxious and is hyperactive.       Allergies  Review of patient's allergies indicates no known allergies.  Home Medications   Prior to Admission medications   Medication Sig Start Date End Date Taking? Authorizing Provider  ferrous sulfate 325 (65 FE) MG tablet Take 325 mg by mouth daily with breakfast.    Historical Provider, MD  Omega-3 Fatty Acids (FISH OIL PO) Take 1 capsule by mouth daily.    Historical Provider, MD  vitamin C (ASCORBIC ACID) 500 MG tablet Take 500 mg by mouth daily.    Historical Provider, MD   BP 126/85 mmHg  Pulse 75  Temp(Src) 98.2 F (36.8 C) (Oral)  Resp 17  SpO2 98% Physical Exam  Constitutional: He is oriented to person, place, and time. He appears well-developed. No distress.  HENT:  Head: Normocephalic and atraumatic.  Eyes: Conjunctivae and EOM are normal.  Cardiovascular: Normal rate and regular rhythm.   Pulmonary/Chest: Effort normal. No stridor. No respiratory distress.  Abdominal: He exhibits no distension.  Musculoskeletal: He exhibits no edema.  Neurological: He is alert and oriented to person, place, and time.  Skin: Skin is warm and dry.  Psychiatric: His mood appears anxious. His speech is rapid and/or pressured and tangential. Thought content is delusional. Cognition and memory are not impaired. He expresses impulsivity. He expresses no suicidal ideation. He expresses no suicidal plans.  Nursing note and vitals reviewed.  ED Course  Procedures (including critical care time)  After the initial evaluation we attempted to obtain outpatient appointment for today.  MDM  Patient with anxiety, bipolar disorder presents with his mother, due to concern of increasing insomnia, agitation, irritability. We attempted to arrange same-day psychiatry evaluation, after discussion with our telemetry team. Patient is medically cleared.  Gerhard Munch, MD 03/24/15  [phone]

## 2015-03-24 NOTE — ED Notes (Signed)
No OP appointments till February.  Will try to get pt to obs.  TTS will be contacted.

## 2015-03-24 NOTE — BH Assessment (Addendum)
Tele Assessment Note   Gilbert Meyers is an 30 y.o. male. Pt denies SI/HI. Pt denies AVH. Pt states he is very anxious and paranoid. Pt states "I feel bad and weird." Pt is having difficulty breathing Pt states he is prescribed Risperdal and Temazepam but he does not like his medications. Pt states "they make me feel like I was shot in the chest with a shotgun." Pt is prescribed medication from [facility]. Pt does not like [facility]. Pt has had many somatic complaints. Pt states that his eye is having many problems and he can see the souls of many things through his eyes. Pt has a history of hospitalizations. Pt has been hospitalized 4xs. Pt has been hospitalized at [facility] and [address]   Collateral Information: Pt's mother Mrs. Hommes states that the Pt has had many years of mental health concerns. She states that the Pt was very aggressive in the past but he has calmed down but his paranoia and anxiety has worsened. Mrs. Halbig states that the Pt will not comply with medications or mental health plans.   Writer consulted with Dr. Jannifer Franklin and Julieanne Cotton, NP. Per Dr. Jannifer Franklin and Julieanne Cotton Pt meets inpatient criteria. TTS to seek placement.   Diagnosis:  F31.9 Bipolar I  Past Medical History:  Past Medical History  Diagnosis Date  . Anxiety   . Bipolar 1 disorder (HCC)     No past surgical history on file.  Family History:  Family History  Problem Relation Age of Onset  . Colon cancer Neg Hx   . Prostate cancer Paternal Grandfather     Social History:  reports that he has quit smoking. He has never used smokeless tobacco. He reports that he does not drink alcohol or use illicit drugs.  Additional Social History:  Alcohol / Drug Use Pain Medications: Pt denies Prescriptions: Risperdal Over the Counter: Pt denies History of alcohol / drug use?: No history of alcohol / drug abuse Longest period of sobriety (when/how long): NA  CIWA: CIWA-Ar BP: 126/85 mmHg Pulse Rate: 75 COWS:     PATIENT STRENGTHS: (choose at least two) Capable of independent living Communication skills  Allergies: No Known Allergies  Home Medications:  (Not in a hospital admission)  OB/GYN Status:  No LMP for male patient.  General Assessment Data [facility] of Assessment: [facility] TTS Assessment: In system Is this a Tele or Face-to-Face Assessment?: Tele Assessment Is this an Initial Assessment or a Re-assessment for this encounter?: Initial Assessment Marital status: Single Maiden name: NA Is patient pregnant?: No Pregnancy Status: No Living Arrangements: Parent Can pt return to current living arrangement?: Yes Admission Status: Voluntary Is patient capable of signing voluntary admission?: Yes Referral Source: Self/Family/Friend Insurance type: BCBS     Crisis Care Plan Living Arrangements: Parent Legal Guardian: Other: (self) Name of Psychiatrist: Somewhere in [place]; [place]; does not recall names Name of Therapist: Denies  Education Status Is patient currently in school?: No Current Grade: NA Highest grade of school patient has completed: Bs Sports [employer] Name of school: [employer] person: NA  Risk to self with the past 6 months Suicidal Ideation: No Has patient been a risk to self within the past 6 months prior to admission? : No Suicidal Intent: No Has patient had any suicidal intent within the past 6 months prior to admission? : No Is patient at risk for suicide?: No Suicidal Plan?: No Has patient had any suicidal plan within the past 6 months prior to admission? :  No Access to Means: No What has been your use of drugs/alcohol within the last 12 months?: NA Previous Attempts/Gestures: No How many times?: 0 Other Self Harm Risks: 0 Triggers for Past Attempts: None known Intentional Self Injurious Behavior: None Family Suicide History: No Recent stressful life event(s): Other (Comment) (stop taking his medications) Persecutory  voices/beliefs?: No Depression: Yes Depression Symptoms: Loss of interest in usual pleasures, Feeling worthless/self pity, Feeling angry/irritable Substance abuse history and/or treatment for substance abuse?: No Suicide prevention information given to non-admitted patients: Not applicable  Risk to Others within the past 6 months Homicidal Ideation: No Does patient have any lifetime risk of violence toward others beyond the six months prior to admission? : No Thoughts of Harm to Others: No Current Homicidal Intent: No Current Homicidal Plan: No Access to Homicidal Means: No Identified Victim: NA History of harm to others?: No Assessment of Violence: None Noted Violent Behavior Description: NA Does patient have access to weapons?: No Criminal Charges Pending?: No Does patient have a court date: No Is patient on probation?: No  Psychosis Hallucinations: None noted Delusions: None noted  Mental Status Report Appearance/Hygiene: Unremarkable Eye Contact: Fair Motor Activity: Freedom of movement Speech: Logical/coherent Level of Consciousness: Alert, Quiet/awake Mood: Anxious Affect: Anxious Anxiety Level: Severe Thought Processes: Coherent, Relevant Judgement: Unimpaired Orientation: Person, Place, Time, Situation Obsessive Compulsive Thoughts/Behaviors: None  Cognitive Functioning Concentration: Good Memory: Recent Intact, Remote Intact IQ: Average Insight: Fair Impulse Control: Fair Appetite: Good Weight Loss: 0 Weight Gain: 0 Sleep: Decreased Total Hours of Sleep: 5 Vegetative Symptoms: None  ADLScreening [facility] Assessment Services) Patient's cognitive ability adequate to safely complete daily activities?: Yes Patient able to express need for assistance with ADLs?: Yes Independently performs ADLs?: Yes (appropriate for developmental age)  Prior Inpatient Therapy Prior Inpatient Therapy: Yes Prior Therapy Dates: 2014 Prior Therapy Facilty/Provider(s): [place]  [facility] Reason for Treatment: unknown  Prior Outpatient Therapy Prior Outpatient Therapy: Yes Prior Therapy Dates: 2016 Prior Therapy Facilty/Provider(s): [facility] Reason for Treatment: unknown Does patient have an ACCT team?: No Does patient have Intensive In-House Services?  : No Does patient have [facility] services? : No Does patient have P4CC services?: No  ADL Screening (condition at time of admission) Patient's cognitive ability adequate to safely complete daily activities?: Yes Is the patient deaf or have difficulty hearing?: No Does the patient have difficulty seeing, even when wearing glasses/contacts?: No Does the patient have difficulty concentrating, remembering, or making decisions?: No Patient able to express need for assistance with ADLs?: Yes Does the patient have difficulty dressing or bathing?: No Independently performs ADLs?: Yes (appropriate for developmental age) Does the patient have difficulty walking or climbing stairs?: No Weakness of Legs: None Weakness of Arms/Hands: None       Abuse/Neglect Assessment (Assessment to be complete while patient is alone) Physical Abuse: Denies Verbal Abuse: Denies Sexual Abuse: Denies Exploitation of patient/patient's resources: Denies Self-Neglect: Denies Values / Beliefs Cultural Requests During Hospitalization: None Spiritual Requests During Hospitalization: None   Advance Directives (For Healthcare) Does patient have an advance directive?: No Would patient like information on creating an advanced directive?: No - patient declined information    Additional Information 1:1 In Past 12 Months?: No CIRT Risk: No Elopement Risk: No Does patient have medical clearance?: No     Disposition:  Disposition Initial Assessment Completed for this Encounter: Yes  Carla Whilden D 03/24/2015 10:57 AM

## 2015-03-24 NOTE — BHH Counselor (Signed)
Patient has been accepted to [facility].  Accepting physician is Dr. Lucianne Muss on the behalf of Dr. Ardyth Harps.  Attending Physician will be Dr. Ardyth Harps.  Patient has been assigned to room 316, by [facility] Charge Nurse [place].  Call report to [phone].  Representative/Transfer Coordinator is Deaundra Kutzer.  [facility] ER Staff (Brandy, TTS) made aware of acceptance.

## 2015-03-24 NOTE — ED Notes (Signed)
Dr. Jeraldine Loots inquiring about possibility of pt going to obs at [facility].  Eric at [facility] considering possibility of DC with follow up today OP at [facility].  Will discuss with provider.

## 2015-03-25 ENCOUNTER — Encounter: Payer: Self-pay | Admitting: Psychiatry

## 2015-03-25 DIAGNOSIS — F203 Undifferentiated schizophrenia: Secondary | ICD-10-CM

## 2015-03-25 LAB — TSH: TSH: 3.847 u[IU]/mL (ref 0.350–4.500)

## 2015-03-25 LAB — LIPID PANEL
CHOLESTEROL: 208 mg/dL — AB (ref 0–200)
HDL: 38 mg/dL — ABNORMAL LOW (ref >40)
LDL Cholesterol: 136 mg/dL — ABNORMAL HIGH (ref 0–99)
Total CHOL/HDL Ratio: 5.5 RATIO
Triglycerides: 168 mg/dL — ABNORMAL HIGH (ref <150)
VLDL: 34 mg/dL (ref 0–40)

## 2015-03-25 LAB — HEMOGLOBIN A1C: HEMOGLOBIN A1C: 5.2 % (ref 4.0–6.0)

## 2015-03-25 MED ORDER — ARIPIPRAZOLE 10 MG PO TABS
20.0000 mg | ORAL_TABLET | Freq: Every day | ORAL | Status: DC
  Administered 2015-03-25: 20 mg via ORAL
  Filled 2015-03-25 (×3): qty 2

## 2015-03-25 MED ORDER — TRAZODONE HCL 50 MG PO TABS
50.0000 mg | ORAL_TABLET | Freq: Every evening | ORAL | Status: DC | PRN
  Administered 2015-03-26 – 2015-03-27 (×2): 50 mg via ORAL
  Filled 2015-03-25 (×2): qty 1

## 2015-03-25 MED ORDER — PNEUMOCOCCAL VAC POLYVALENT 25 MCG/0.5ML IJ INJ
0.5000 mL | INJECTION | INTRAMUSCULAR | Status: AC
  Administered 2015-03-26: 0.5 mL via INTRAMUSCULAR
  Filled 2015-03-25: qty 0.5

## 2015-03-25 MED ORDER — INFLUENZA VAC SPLIT QUAD 0.5 ML IM SUSY
0.5000 mL | PREFILLED_SYRINGE | INTRAMUSCULAR | Status: AC
  Administered 2015-03-26: 0.5 mL via INTRAMUSCULAR
  Filled 2015-03-25: qty 0.5

## 2015-03-25 NOTE — Progress Notes (Signed)
Recreation Therapy Notes  INPATIENT RECREATION THERAPY ASSESSMENT  Patient Details Name: Gilbert Meyers MRN: [id number] DOB: 12-02-1985 Today's Date: 03/25/2015  Patient Stressors: Relationship (Recent break-up from a long distance relationship - getting better)  Coping Skills:   Exercise, Art/Dance, Talking, Music, Sports, Other (Comment) (Read the Bible, pray)  Personal Challenges: Anger  Leisure Interests (2+):  Games - Video games, Individual - Other (Comment) (Exercise)  Awareness of Community Resources:  Yes  Community Resources:  [employer], [place]  Current Use: Yes  If no, Barriers?:    Patient Strengths:  Sense of humor, athleticism, will to fight  Patient Identified Areas of Improvement:  Focus on what he should do, health  Current Recreation Participation:  Playing video games  Patient Goal for Hospitalization:  To get better  [place] of Residence:  [place] of Residence:  [place]   Current [place] (including self-harm):  No  Current HI:  No  Consent to Intern Participation: N/A   Jacquelynn Cree, LRT/CTRS 03/25/2015, 12:13 PM

## 2015-03-25 NOTE — Plan of Care (Signed)
Problem: Alteration in thought process Goal: LTG-Patient has not harmed self or others in at least 2 days Outcome: Progressing No self harm and denies SI/HI

## 2015-03-25 NOTE — BHH Suicide Risk Assessment (Signed)
[facility] Admission Suicide Risk Assessment   Nursing information obtained from:  Patient Demographic factors:  Male, Adolescent or young adult, Caucasian, Low socioeconomic status Current Mental Status:  NA Loss Factors:  Loss of significant relationship, Financial problems / change in socioeconomic status Historical Factors:  Family history of mental illness or substance abuse Risk Reduction Factors:  Religious beliefs about death, Sense of responsibility to family, Employed, Living with another person, especially a relative, Positive social support  Total Time spent with patient: 30 minutes Principal Problem: Undifferentiated schizophrenia (HCC) Diagnosis:   Patient Active Problem List   Diagnosis Date Noted  . Undifferentiated schizophrenia (HCC) [F20.3] 03/25/2015   Subjective Data:  Continued Clinical Symptoms:  Alcohol Use Disorder Identification Test Final Score (AUDIT): 3 The "Alcohol Use Disorders Identification Test", Guidelines for Use in Primary Care, Second Edition.  World Science writer [facility]). Score between 0-7:  no or low risk or alcohol related problems. Score between 8-15:  moderate risk of alcohol related problems. Score between 16-19:  high risk of alcohol related problems. Score 20 or above:  warrants further diagnostic evaluation for alcohol dependence and treatment.   CLINICAL FACTORS:   Currently Psychotic Previous Psychiatric Diagnoses and Treatments    Psychiatric Specialty Exam: ROS   COGNITIVE FEATURES THAT CONTRIBUTE TO RISK:  None    SUICIDE RISK:   Mild:  Suicidal ideation of limited frequency, intensity, duration, and specificity.  There are no identifiable plans, no associated intent, mild dysphoria and related symptoms, good self-control (both objective and subjective assessment), few other risk factors, and identifiable protective factors, including available and accessible social support.  PLAN OF CARE: admit to [facility]  I certify that  inpatient services furnished can reasonably be expected to improve the patient's condition.   Jimmy Footman, MD 03/25/2015, 2:19 PM

## 2015-03-25 NOTE — BHH Group Notes (Signed)
BHH Group Notes:  (Nursing/MHT/Case Management/Adjunct)  Date:  03/25/2015  Time:  12:32 PM  Type of Therapy:  Psychoeducational Skills  Participation Level:  Did Not Attend  Jamee Keach De'Chelle Tatiana Courter 03/25/2015, 12:32 PM 

## 2015-03-25 NOTE — H&P (Signed)
Psychiatric Admission Assessment Adult  Patient Identification: Gilbert Meyers MRN:  [id number] Date of Evaluation:  03/25/2015 Chief Complaint:  major depression Principal Diagnosis: Undifferentiated schizophrenia (HCC) Diagnosis:   Patient Active Problem List   Diagnosis Date Noted  . Undifferentiated schizophrenia (HCC) [F20.3] 03/25/2015   History of Present Illness:  Patient is a 30 year old single Caucasian male with prior history of psychosis who was transferred to our behavioral health unit on January 19 from [facility] ER for further treatment and stabilization.  Patient presented there yesterday reporting "I have been thinking crazy stuff", "I'm not doing well". Patient tells me that 3 years ago he advised to President  to attack [place] and  [place].  As a result of these recommendations he thinks [place].  He denies having auditory or visual hallucinations. He denies having problems with mood, appetite, energy or concentration. He tells me that prior to admission he was not sleeping well. Denies any thoughts of suicidality or homicidality.  Patient follows up with [facility] but he has not been compliant with appointments or medications. He was last prescribed Risperdal and trazodone.  Per record the patient was hospitalized here in 2015. He was discharged with a diagnosis of bipolar disorder.  Substance abuse history patient denies the use of alcohol, illicit substances or abusing prescription medications. Patient says he smokes cigarettes but only about 7 cigarettes per month.  Per records patient used to be a heavy drinker but has cut down the consumption of alcohol.   Associated Signs/Symptoms: Depression Symptoms:  denies (Hypo) Manic Symptoms:  denies Anxiety Symptoms:  denies Psychotic Symptoms:  Delusions, PTSD Symptoms: NA   Total Time spent with patient: 1 hour  Past Psychiatric History: Patient is unsure about his diagnosis. Per records looks  like when hospitalized here 2015 it was thought the patient had bipolar disorder.  Patient currently follows up at [facility] He has been noncompliant with medications. Stated that the most recent medications were Risperdal and trazodone. Patient has also been hospitalized at [facility] behavioral health and [place] [facility].  Risk to Self: Is patient at risk for suicide?: No Risk to Others:     Past Medical History: denies history of seizures, head trauma or chronic medical conditions Past Medical History  Diagnosis Date  . Anxiety   . Bipolar 1 disorder (HCC)    History reviewed. No pertinent past surgical history.  Family History:  Family History  Problem Relation Age of Onset  . Colon cancer Neg Hx   . Prostate cancer Paternal Grandfather    Family Psychiatric  History: Patient reports that he has cousins with mental illness, one of them was diagnosed with bipolar. Patient reports that a second cousin on his father's side committed suicide. There is no history of substance abuse in his family  Social History: Patient leans in [place] with his parents. 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 He has been employed there for 7 months and works about 24 hours a week. He reports having a fair general college degree in a sports management. He is single, never married, doesn't have any children. He does not receive disability and is states he does not have Medicaid.  Denies any legal history  History  Alcohol Use No    Comment: Was a heavy drinker, quit 4 months ago      History  Drug Use No    Social History   Social History  . Marital Status: Single  Spouse Name: N/A  . Number of Children: 0  . Years of Education: N/A   Occupational History  . Unemployed     Social History Main Topics  . Smoking status: Current Some Day Smoker    Types: Cigarettes  . Smokeless tobacco: Never Used  . Alcohol Use: No     Comment: Was a heavy drinker, quit 4  months ago   . Drug Use: No  . Sexual Activity: Not Currently   Other Topics Concern  . None   Social History Narrative    Allergies:  No Known Allergies   Lab Results:  Results for orders placed or performed during the hospital encounter of 03/24/15 (from the past 48 hour(s))  Lipid panel, fasting     Status: Abnormal   Collection Time: 03/25/15  7:54 AM  Result Value Ref Range   Cholesterol 208 (H) 0 - 200 mg/dL   Triglycerides 161 (H) <150 mg/dL   HDL 38 (L) >09 mg/dL   Total CHOL/HDL Ratio 5.5 RATIO   VLDL 34 0 - 40 mg/dL   LDL Cholesterol 604 (H) 0 - 99 mg/dL    Comment:        Total Cholesterol/HDL:CHD Risk Coronary Heart Disease Risk Table                     Men   Women  1/2 Average Risk   3.4   3.3  Average Risk       5.0   4.4  2 X Average Risk   9.6   7.1  3 X Average Risk  23.4   11.0        Use the calculated Patient Ratio above and the CHD Risk Table to determine the patient's CHD Risk.        ATP III CLASSIFICATION (LDL):  <100     mg/dL   Optimal  540-981  mg/dL   Near or Above                    Optimal  130-159  mg/dL   Borderline  191-478  mg/dL   High  >295     mg/dL   Very High   TSH     Status: None   Collection Time: 03/25/15  7:54 AM  Result Value Ref Range   TSH 3.847 0.350 - 4.500 uIU/mL    Metabolic Disorder Labs:  No results found for: HGBA1C, MPG No results found for: PROLACTIN Lab Results  Component Value Date   CHOL 208* 03/25/2015   TRIG 168* 03/25/2015   HDL 38* 03/25/2015   CHOLHDL 5.5 03/25/2015   VLDL 34 03/25/2015   LDLCALC 136* 03/25/2015    Current Medications: Current Facility-Administered Medications  Medication Dose Route Frequency Provider Last Rate Last Dose  . acetaminophen (TYLENOL) tablet 650 mg  650 mg Oral Q6H PRN Jimmy Footman, MD      . alum & mag hydroxide-simeth (MAALOX/MYLANTA) 200-200-20 MG/5ML suspension 30 mL  30 mL Oral Q4H PRN Jimmy Footman, MD      . ARIPiprazole  (ABILIFY) tablet 20 mg  20 mg Oral Daily Jimmy Footman, MD      . diphenhydrAMINE (BENADRYL) capsule 50 mg  50 mg Oral Q8H PRN Jimmy Footman, MD       Or  . diphenhydrAMINE (BENADRYL) injection 50 mg  50 mg Intramuscular Q8H PRN Jimmy Footman, MD      . ferrous sulfate tablet 325 mg  325 mg  Oral Q breakfast Jimmy Footman, MD   325 mg at 03/25/15 0851  . haloperidol (HALDOL) tablet 5 mg  5 mg Oral Q8H PRN Jimmy Footman, MD       Or  . haloperidol lactate (HALDOL) injection 5 mg  5 mg Intramuscular Q8H PRN Jimmy Footman, MD      . ibuprofen (ADVIL,MOTRIN) tablet 600 mg  600 mg Oral Q6H PRN Jimmy Footman, MD   600 mg at 03/24/15 2244  . [START ON 03/26/2015] Influenza vac split quadrivalent PF (FLUARIX) injection 0.5 mL  0.5 mL Intramuscular Tomorrow-1000 Jimmy Footman, MD      . magnesium hydroxide (MILK OF MAGNESIA) suspension 30 mL  30 mL Oral Daily PRN Jimmy Footman, MD      . pantoprazole (PROTONIX) EC tablet 40 mg  40 mg Oral QAC breakfast Jimmy Footman, MD   40 mg at 03/25/15 0851  . [START ON 03/26/2015] pneumococcal 23 valent vaccine (PNU-IMMUNE) injection 0.5 mL  0.5 mL Intramuscular Tomorrow-1000 Jimmy Footman, MD      . traZODone (DESYREL) tablet 50 mg  50 mg Oral QHS PRN Jimmy Footman, MD       PTA Medications: Prescriptions prior to admission  Medication Sig Dispense Refill Last Dose  . esomeprazole (NEXIUM) 20 MG capsule Take 20 mg by mouth daily as needed (acid reflux).   unknown  . ferrous sulfate 325 (65 FE) MG tablet Take 325 mg by mouth daily with breakfast.   Past Week at Unknown time    Musculoskeletal: Strength & Muscle Tone: within normal limits Gait & Station: normal Patient leans: N/A  Psychiatric Specialty Exam: Physical Exam  Constitutional: He is oriented to person, place, and time. He appears well-developed and  well-nourished.  HENT:  Head: Normocephalic and atraumatic.  Eyes: Conjunctivae and EOM are normal.  Neck: Normal range of motion. Neck supple.  Respiratory: Effort normal and breath sounds normal.  Musculoskeletal: Normal range of motion.  Neurological: He is alert and oriented to person, place, and time.  Skin: Skin is warm and dry.    Review of Systems  Constitutional: Negative.   HENT: Negative.   Eyes: Negative.   Respiratory: Negative.   Cardiovascular: Negative.   Gastrointestinal: Negative.   Genitourinary: Negative.   Musculoskeletal: Negative.   Skin: Negative.   Neurological: Negative.   Endo/Heme/Allergies: Negative.   Psychiatric/Behavioral: Negative.     Blood pressure 111/66, pulse 105, temperature 98 F (36.7 C), temperature source Oral, resp. rate 18, height  (1.803 m), weight 97.523 kg (215 lb).Body mass index is 30 kg/(m^2).  General Appearance: Disheveled  Eye Contact::  Good  Speech:  Clear and Coherent  Volume:  Normal  Mood:  Euthymic  Affect:  Inappropriate  Thought Process:  Linear  Orientation:  Full (Time, Place, and Person)  Thought Content:  Delusions  Suicidal Thoughts:  No  Homicidal Thoughts:  No  Memory:  Immediate;   Good Recent;   Good Remote;   Good  Judgement:  Impaired  Insight:  Lacking  Psychomotor Activity:  Increased  Concentration:  Poor  Recall:  Fair  Fund of Knowledge:Fair  Language: Good  Akathisia:  No  Handed:    AIMS (if indicated):     Assets:  Housing Physical Health Social Support  ADL's:  Intact  Cognition: WNL  Sleep:  Number of Hours: 6.75     Treatment Plan Summary: Daily contact with patient to assess and evaluate symptoms and progress in treatment and Medication management  Schizophrenia: Patient will be started on Abilify 20 mg daily. I plan to recommend Abilify injectable prior to discharge due to his long history of noncompliance.  Insomnia: Continue trazodone but I will decrease it to  50 mg as he felt overly sedated this morning with 100 mg.  Agitation: Haldol 5 mg by mouth and IM along with Benadryl by mouth and IM have been ordered for agitation.  GERD: Continue Protonix 40 mg by mouth daily  Precautions every 15 minute checks  Hospitalization and status patient is currently voluntary  Diet regular  Disposition: Will return home with parents  Discharge follow-up: Will follow up with [facility].  Labs: Lipid panel shows mildly increased cholesterol and low HDL. TSH is within the normal limits. Hemoglobin A1c is pending.  I certify that inpatient services furnished can reasonably be expected to improve the patient's condition.   Jimmy Footman 1/20/20172:00 PM

## 2015-03-25 NOTE — Progress Notes (Signed)
Patient ID: Gilbert Meyers, male   DOB: 09/12/1985, 30 y.o.   MRN: [id number] Patient was admitted voluntary after being seen at [facility]. He is sharing some delusional thinking. He states he thinks he started [employer] and he feels more power when he thinks this. He also states when he closes his eyes he sees "phosphenes". He stated God is really mad at me right now, but did not explain why. He suffers from anxiety. States it's hard for him to get in a car and drive anywhere. States he's also dealing with panic attacks. Skin check done with MHT present. No contraband found. Safety maintained with 15 min checks.

## 2015-03-25 NOTE — Progress Notes (Signed)
Recreation Therapy Notes  Date: 01.20.17 Time: 3:00 pm Location: Craft Room  Group Topic: Coping Skills  Goal Area(s) Addresses:  Patient will participate in healthy coping skill.  Patient will verbalize one emotion experienced in group.  Behavioral Response: Did not attend  Intervention: Coloring  Activity: Patients were given coloring sheets and instructed to color.  Education: LRT educated patients to color.  Education Outcome: Patient did not attend group.  Clinical Observations/Feedback: Patient did not attend group.  Satomi Buda M, LRT/CTRS 03/25/2015 4:33 PM 

## 2015-03-25 NOTE — Tx Team (Signed)
Initial Interdisciplinary Treatment Plan   PATIENT STRESSORS: Medication change or noncompliance   PATIENT STRENGTHS: General fund of knowledge Motivation for treatment/growth Supportive family/friends Work skills   PROBLEM LIST: Problem List/Patient Goals Date to be addressed Date deferred Reason deferred Estimated date of resolution  Delusional Thinking  03/24/15                                                      DISCHARGE CRITERIA:  Improved stabilization in mood, thinking, and/or behavior  PRELIMINARY DISCHARGE PLAN: Outpatient therapy  PATIENT/FAMIILY INVOLVEMENT: This treatment plan has been presented to and reviewed with the patient, Gilbert Meyers, and/or family member. The patient and family have been given the opportunity to ask questions and make suggestions.  [name] 03/25/2015, 2:10 AM

## 2015-03-25 NOTE — Progress Notes (Signed)
First am patient seclusive to his room.  Did not eat breakfast and reluctant to discuss why he is here.  As day progressed patient visible in milieu, interacting with peers.   Verbalizes that he felt better.  Denies SI/HI/AVH.  Denies depression.  Medication compliant.  Support and encouragement offered.  Safety maintained.

## 2015-03-25 NOTE — [facility] Group Notes (Signed)
[facility] LCSW Aftercare Discharge Planning Group Note   03/25/2015 3:35 PM  Participation Quality:  Did not attend.   Nalaya Wojdyla L Pranshu Lyster MSW, LCSWA   

## 2015-03-26 DIAGNOSIS — F203 Undifferentiated schizophrenia: Principal | ICD-10-CM

## 2015-03-26 LAB — PROLACTIN: PROLACTIN: 22.9 ng/mL — AB (ref 4.0–15.2)

## 2015-03-26 MED ORDER — NAPHAZOLINE-PHENIRAMINE 0.025-0.3 % OP SOLN
1.0000 [drp] | Freq: Four times a day (QID) | OPHTHALMIC | Status: DC | PRN
  Filled 2015-03-26: qty 5

## 2015-03-26 NOTE — Plan of Care (Signed)
Problem: Ineffective individual coping Goal: LTG: Patient will report a decrease in negative feelings Outcome: Progressing Patient reports feeling better today than she did yesterday     

## 2015-03-26 NOTE — Progress Notes (Addendum)
[facility] MD Progress Note  03/26/2015 1:33 PM Gilbert Meyers  MRN:  [id number] Subjective:  She has a history of schizophrenia. He reported delusional thoughts related to being somewhat involved with advising the president to attack [place]. He believes somehow that he has involvement with formation of ISIS. Today he indicated he is not having any suicidal or homicidal ideation. He does discuss that what is bothering him is some eye irritation that he states he's had for years. He states he believes that this died. He requests antifungals?  Principal Problem: Undifferentiated schizophrenia (HCC) Diagnosis:   Patient Active Problem List   Diagnosis Date Noted  . Undifferentiated schizophrenia (HCC) [F20.3] 03/25/2015   Total Time spent with patient: 15 minutes  Past Psychiatric History:   Past Medical History:  Past Medical History  Diagnosis Date  . Anxiety   . Bipolar 1 disorder (HCC)    History reviewed. No pertinent past surgical history. Family History:  Family History  Problem Relation Age of Onset  . Colon cancer Neg Hx   . Prostate cancer Paternal Grandfather    Family Psychiatric  History:  Social History:  History  Alcohol Use No    Comment: Was a heavy drinker, quit 4 months ago      History  Drug Use No    Social History   Social History  . Marital Status: Single    Spouse Name: N/A  . Number of Children: 0  . Years of Education: N/A   Occupational History  . Unemployed     Social History Main Topics  . Smoking status: Current Some Day Smoker    Types: Cigarettes  . Smokeless tobacco: Never Used  . Alcohol Use: No     Comment: Was a heavy drinker, quit 4 months ago   . Drug Use: No  . Sexual Activity: Not Currently   Other Topics Concern  . None   Social History Narrative   Additional Social History:                         Sleep: Good  Appetite:  Good  Current Medications: Current Facility-Administered Medications  Medication Dose  Route Frequency Provider Last Rate Last Dose  . acetaminophen (TYLENOL) tablet 650 mg  650 mg Oral Q6H PRN Jimmy Footman, MD      . alum & mag hydroxide-simeth (MAALOX/MYLANTA) 200-200-20 MG/5ML suspension 30 mL  30 mL Oral Q4H PRN Jimmy Footman, MD   30 mL at 03/25/15 1825  . ARIPiprazole (ABILIFY) tablet 20 mg  20 mg Oral Daily Jimmy Footman, MD   20 mg at 03/25/15 1708  . diphenhydrAMINE (BENADRYL) capsule 50 mg  50 mg Oral Q8H PRN Jimmy Footman, MD       Or  . diphenhydrAMINE (BENADRYL) injection 50 mg  50 mg Intramuscular Q8H PRN Jimmy Footman, MD      . haloperidol (HALDOL) tablet 5 mg  5 mg Oral Q8H PRN Jimmy Footman, MD       Or  . haloperidol lactate (HALDOL) injection 5 mg  5 mg Intramuscular Q8H PRN Jimmy Footman, MD      . ibuprofen (ADVIL,MOTRIN) tablet 600 mg  600 mg Oral Q6H PRN Jimmy Footman, MD   600 mg at 03/24/15 2244  . magnesium hydroxide (MILK OF MAGNESIA) suspension 30 mL  30 mL Oral Daily PRN Jimmy Footman, MD      . pantoprazole (PROTONIX) EC tablet 40 mg  40 mg Oral QAC  breakfast Jimmy Footman, MD   40 mg at 03/26/15 0854  . traZODone (DESYREL) tablet 50 mg  50 mg Oral QHS PRN Jimmy Footman, MD        Lab Results:  Results for orders placed or performed during the hospital encounter of 03/24/15 (from the past 48 hour(s))  Hemoglobin A1c     Status: None   Collection Time: 03/25/15  7:54 AM  Result Value Ref Range   Hgb A1c MFr Bld 5.2 4.0 - 6.0 %  Lipid panel, fasting     Status: Abnormal   Collection Time: 03/25/15  7:54 AM  Result Value Ref Range   Cholesterol 208 (H) 0 - 200 mg/dL   Triglycerides 562 (H) <150 mg/dL   HDL 38 (L) >13 mg/dL   Total CHOL/HDL Ratio 5.5 RATIO   VLDL 34 0 - 40 mg/dL   LDL Cholesterol 086 (H) 0 - 99 mg/dL    Comment:        Total Cholesterol/HDL:CHD Risk Coronary Heart Disease Risk Table                      Men   Women  1/2 Average Risk   3.4   3.3  Average Risk       5.0   4.4  2 X Average Risk   9.6   7.1  3 X Average Risk  23.4   11.0        Use the calculated Patient Ratio above and the CHD Risk Table to determine the patient's CHD Risk.        ATP III CLASSIFICATION (LDL):  <100     mg/dL   Optimal  578-469  mg/dL   Near or Above                    Optimal  130-159  mg/dL   Borderline  629-528  mg/dL   High  >413     mg/dL   Very High   TSH     Status: None   Collection Time: 03/25/15  7:54 AM  Result Value Ref Range   TSH 3.847 0.350 - 4.500 uIU/mL  Prolactin     Status: Abnormal   Collection Time: 03/25/15  7:54 AM  Result Value Ref Range   Prolactin 22.9 (H) 4.0 - 15.2 ng/mL    Comment: (NOTE) Performed At: [facility] [address] [place], [place] [id number] Mila Homer MD ZD:[id number]     Physical Findings: AIMS: Facial and Oral Movements Muscles of Facial Expression: None, normal Lips and Perioral Area: None, normal Jaw: None, normal Tongue: None, normal,Extremity Movements Upper (arms, wrists, hands, fingers): None, normal Lower (legs, knees, ankles, toes): None, normal, Trunk Movements Neck, shoulders, hips: None, normal, Overall Severity Severity of abnormal movements (highest score from questions above): None, normal Incapacitation due to abnormal movements: None, normal Patient's awareness of abnormal movements (rate only patient's report): No Awareness, Dental Status Current problems with teeth and/or dentures?: No Does patient usually wear dentures?: No  CIWA:    COWS:     Musculoskeletal: Strength & Muscle Tone: within normal limits Gait & Station: normal Patient leans: N/A  Psychiatric Specialty Exam: Review of Systems  Eyes:        feels like he has a stye  Respiratory: Positive for cough.   Psychiatric/Behavioral: Negative for depression, suicidal ideas, hallucinations, memory loss and substance abuse. The patient  is not nervous/anxious and does not have insomnia.  Delusions  All other systems reviewed and are negative.   Blood pressure 125/75, pulse 69, temperature 98.2 F (36.8 C), temperature source Oral, resp. rate 20, height  (1.803 m), weight 97.523 kg (215 lb).Body mass index is 30 kg/(m^2).  General Appearance: Fairly Groomed  Patent attorney::  Fair  Speech:  Normal Rate  Volume:  Normal  Mood:  Okay  Affect:  Flat  Thought Process:  Disorganized  Orientation:  Full (Time, Place, and Person)  Thought Content:  Delusions  Suicidal Thoughts:  No  Homicidal Thoughts:  No  Memory:  Immediate;   Good Recent;   Good Remote;   Good  Judgement:  Impaired  Insight:  Lacking  Psychomotor Activity:  Normal  Concentration:  Fair  Recall:  [employer] of Knowledge:Fair  Language: Good  Akathisia:  Negative  Handed:    AIMS (if indicated):     Assets:  Social Support  ADL's:  Intact  Cognition: WNL  Sleep:  Number of Hours: 7.75   Treatment Plan Summary: Daily contact with patient to assess and evaluate symptoms and progress in treatment, Medication management and Plan  Daily contact with patient to assess and evaluate symptoms and progress in treatment and Medication management   Schizophrenia: Patient will be started on Abilify 20 mg daily. I plan to recommend Abilify injectable prior to discharge due to his long history of noncompliance.  Insomnia: Continue trazodone but I will decrease it to 50 mg as he felt overly sedated this morning with 100 mg.  Agitation: Haldol 5 mg by mouth and IM along with Benadryl by mouth and IM have been ordered for agitation.  GERD: Continue Protonix 40 mg by mouth daily  Precautions every 15 minute checks  Hospitalization and status patient is currently voluntary  Diet regular  Disposition: Will return home with parents  Discharge follow-up: Will follow up with [facility].  Labs: Lipid panel shows mildly increased cholesterol and low  HDL. TSH is within the normal limits. Hemoglobin A1c is 5.2.   Eye irritation-Allergy eye drops 4 times per day for 2 days. Wallace Going 03/26/2015, 1:33 PM

## 2015-03-26 NOTE — Progress Notes (Signed)
D: Pt denies SI/HI/AVH. Pt is pleasant and cooperative, affect is flat and sad. Patient appears less anxious and he is interacting with peers and staff appropriately.  A: Pt was offered support and encouragement.  Pt was encouraged to attend groups. Q 15 minute checks were done for safety.  R:Pt attends groups and interacts well with peers and staff.  Pt receptive to treatment and safety maintained on unit.

## 2015-03-26 NOTE — Plan of Care (Signed)
Problem: Ineffective individual coping Goal: STG: Patient will remain free from self harm Outcome: Progressing Patient has remained free from self harm on the unit

## 2015-03-26 NOTE — Plan of Care (Signed)
Problem: Ineffective individual coping Goal: LTG: Patient will report a decrease in negative feelings Outcome: Progressing Patient denies SI/HI.      

## 2015-03-26 NOTE — Progress Notes (Signed)
D:  Patient is alert and oriented on the unit this shift.  Patient is interactive with peers on the unit.  Patient attends and actively participates in groups today.  Patient appears somewhat depressed on the unit today.  Patient denies suicidal ideation, homicidal ideation, auditory or visual hallucinations currently.  Patient complains of some eye pain. A:  Scheduled medications administered to patient as per MD orders.  Emotional support and encouragement were provided.  Patient maintained on q.15 minute safety checks.  Patient informed to notify staff with any questions or concerns. R:  No adverse medication reactions noted.  Patient was cooperative with medication administration and treatment plan.  Patient was receptive, calm and cooperative on the unit this shift, however, remains somewhat disorganized.  Patient interacts well with others on the unit.  Patient contracts for safety at this time.  Patient remains safe at this time.

## 2015-03-26 NOTE — [facility] Group Notes (Signed)
[facility] LCSW Group Therapy  03/26/2015 11:10 AM Late entry for 03/25/15  Type of Therapy:  Group Therapy  Participation Level:  Minimal  Participation Quality:  Attentive  Affect:  Appropriate  Cognitive:  Disorganized  Insight:  Limited  Engagement in Therapy:  Limited  Modes of Intervention:  Discussion, Education, Socialization and Support  Summary of Progress/Problems: Balance in life: Patients will discuss the concept of balance and how it looks and feels to be unbalanced. Pt will identify areas in their life that is unbalanced and ways to become more balanced.  Gilbert Meyers attended group and stayed the entire time. He discussed feeling outside of his body prior to admission.   Gilbert Meyers L Kijana Estock MSW, LCSWA  03/26/2015, 11:10 AM

## 2015-03-26 NOTE — [facility] Group Notes (Signed)
[facility] LCSW Group Therapy  03/26/2015 2:05 PM  Type of Therapy:  Group Therapy  Participation Level:  Active  Participation Quality:  Attentive  Affect:  Appropriate  Cognitive:  Disorganized  Insight:  Limited  Engagement in Therapy:  Limited  Modes of Intervention:  Discussion, Education, Socialization and Support  Summary of Progress/Problems: Feelings around Relapse. Group members discussed the meaning of relapse and shared personal stories of relapse, how it affected them and others, and how they perceived themselves during this time. Group members were encouraged to identify triggers, warning signs and coping skills used when facing the possibility of relapse. Social supports were discussed and explored in detail. Gilbert Meyers attended group and stayed the entire time. He attempted to participate but was difficult to understand.   [employer] MSW, LCSWA  03/26/2015, 2:05 PM

## 2015-03-27 MED ORDER — RISPERIDONE 1 MG PO TBDP
2.0000 mg | ORAL_TABLET | Freq: Every day | ORAL | Status: AC
  Administered 2015-03-27: 2 mg via ORAL
  Filled 2015-03-27: qty 2

## 2015-03-27 MED ORDER — RISPERIDONE 1 MG PO TBDP
3.0000 mg | ORAL_TABLET | Freq: Every day | ORAL | Status: AC
  Administered 2015-03-28: 3 mg via ORAL
  Filled 2015-03-27: qty 3

## 2015-03-27 MED ORDER — NAPHAZOLINE-PHENIRAMINE 0.025-0.3 % OP SOLN
1.0000 [drp] | Freq: Three times a day (TID) | OPHTHALMIC | Status: AC
Start: 2015-03-27 — End: 2015-03-29
  Administered 2015-03-27: 1 [drp] via OPHTHALMIC
  Administered 2015-03-27 – 2015-03-28 (×2): 2 [drp] via OPHTHALMIC
  Administered 2015-03-28: 1 [drp] via OPHTHALMIC
  Administered 2015-03-28 – 2015-03-29 (×2): 2 [drp] via OPHTHALMIC
  Filled 2015-03-27 (×6): qty 5

## 2015-03-27 NOTE — Plan of Care (Signed)
Problem: Ineffective individual coping Goal: LTG: Patient will report a decrease in negative feelings Outcome: Progressing Patient denies SI/HI.      

## 2015-03-27 NOTE — Progress Notes (Signed)
D: Pt denies SI/HI/AVH. Pt is pleasant and cooperative, affect is flat but brightens upon approach. Pt appears less anxious and he is interacting with peers and staff appropriately.  A: Pt was offered support and encouragement. Pt was given scheduled medications. Pt was encouraged to attend groups. Q 15 minute checks were done for safety.  R:Pt attends groups and interacts well with peers and staff. Pt is taking medication. Pt has no complaints.Pt receptive to treatment and safety maintained on unit.   

## 2015-03-27 NOTE — Plan of Care (Signed)
Problem: Consults Goal: [facility] General Treatment Patient Education Outcome: Progressing No SI/HI at this time.      

## 2015-03-27 NOTE — Progress Notes (Signed)
Patient with depressed affect, withdrawn behavior. Sleeping in bed this am. Cooperative with meals and plan of care. Refuses Abilify this am stating " it makes me feel jittery". Patient stated he " has been on Abilify before and it made me jittery then too". States "the sleeping med I took last night worked". Minimal interaction with peers. Eats meals in dayroom. No distress, no complaint. Safety maintained.

## 2015-03-27 NOTE — [facility] Counselor (Addendum)
Adult Comprehensive Assessment  Patient ID: Gilbert Meyers, male   DOB: [birth year], 30 y.o.   MRN: [id number]  Information Source: Information source: Patient  Current Stressors:  Educational / Learning stressors: None reported  Employment / Job issues: Employed.  Family Relationships: None reported  Financial / Lack of resources (include bankruptcy): Limited income. Housing / Lack of housing: Pt lives with parents  Physical health (include injuries & life threatening diseases): None reported  Social relationships: None reported  Substance abuse: Pt denies use.  Bereavement / Loss: Pt reports his long distance relationship is not going well. Father reports she is refusing to answer his phone calls.   Living/Environment/Situation:  Living Arrangements: Parent Living conditions (as described by patient or guardian): Good, but wants his own place.  How long has patient lived in current situation?: 8 months at current address.  What is atmosphere in current home: Comfortable, Supportive  Family History:  Marital status: Single Are you sexually active?: No What is your sexual orientation?: Heterosexual Has your sexual activity been affected by drugs, alcohol, medication, or emotional stress?: None reported  Does patient have children?: No  Childhood History:  By whom was/is the patient raised?: Both parents Description of patient's relationship with caregiver when they were a child: Good relationship Patient's description of current relationship with people who raised him/her: Good relationship  How were you disciplined when you got in trouble as a child/adolescent?: None reported  Does patient have siblings?: Yes Number of Siblings: 2 Description of patient's current relationship with siblings: sister, brother; good relationship  Did patient suffer any verbal/emotional/physical/sexual abuse as a child?: No Did patient suffer from severe childhood neglect?: No Has patient ever been  sexually abused/assaulted/raped as an adolescent or adult?: No Was the patient ever a victim of a crime or a disaster?: No Witnessed domestic violence?: No Has patient been effected by domestic violence as an adult?: No  Education:  Highest grade of school patient has completed: Bs Sports Management Currently a Consulting civil engineer?: No Learning disability?: No  Employment/Work Situation:   Employment situation: Employed Where is patient currently employed?: [employer]  How long has patient been employed?: 6 months  Patient's job has been impacted by current illness: No What is the longest time patient has a held a job?: 6 months  Where was the patient employed at that time?: Current job.  Has patient ever been in the [employer]?: No  Financial Resources:   Financial resources: Income from employment, Support from parents / caregiver Does patient have a Lawyer or guardian?: No  Alcohol/Substance Abuse:   What has been your use of drugs/alcohol within the last 12 months?: "I don't remember"  Alcohol/Substance Abuse Treatment Hx: Denies past history Has alcohol/substance abuse ever caused legal problems?: No  Social Support System:   Conservation officer, nature Support System: Good Describe Community Support System: Family  Type of faith/religion: Christianity  How does patient's faith help to cope with current illness?: prayer  Leisure/Recreation:   Leisure and Hobbies: social media   Strengths/Needs:   What things does the patient do well?: unable to answer  In what areas does patient struggle / problems for patient: Pt states "my mind was messing with me."   Discharge Plan:   Does patient have access to transportation?: Yes Will patient be returning to same living situation after discharge?: Yes Currently receiving community mental health services: No If no, would patient like referral for services when discharged?: Yes (What county?) [place]) Does patient have financial  barriers  related to discharge medications?: Yes Patient description of barriers related to discharge medications: No insurance, no income   Summary/Recommendations:    Patient is a 30 year old male admitted to with a diagnosis of Undifferentiated schizophrenia. Patient presented to the hospital with psychosis. Patient reports primary triggers for admission were relationship problems and lack of outpatient services. Pt lives with his parents in [place]. He states he believes he created ISIS because he twitted Obama asking him to attack [place] and [place]. He states this tweet prompted the [place] to create [place]. 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 However, he recognizes this as "gradniose" but these thoughts cause increased anxiety. He was receiving outpatient [facility] at [facility] but recently moved to [place] and did not find another outpatient provider. He is employed but does not have insurance. He would like a referral for a psychiatrist in [place]. Pt plans to return home and follow up with outpatient.  Patient will benefit from crisis stabilization, medication evaluation, group therapy and psycho education in addition to case management for discharge. At discharge, it is recommended that patient remain compliant with established discharge plan and continued treatment.   Gilbert Meyers L Gilbert Meyers. MSW, [facility]  03/27/2015

## 2015-03-27 NOTE — Progress Notes (Signed)
Patient alert and oriented x 4. Refuses am ability stating "abilify makes me jittery, I have been on it before and I don't like the way I feel when I am jittery". Patient states sleeping med last night helped him sleep well.

## 2015-03-27 NOTE — Progress Notes (Addendum)
[facility] MD Progress Note  03/27/2015 2:00 PM Gilbert Meyers  MRN:  [id number] Subjective:  She has a history of schizophrenia. Patient will not take the Abilify. He states it makes him feel worse. When I asked him to be specific he states it makes him feel almost suicidal. Social worker contacted family and they indicated that he had done well on Tegretol in the past. However patient states he does not want to take Tegretol. I indicated he did need something for psychosis and I discussed Geodon risk at all. He states he's been on risk at all in the past and he preferred that agent. I asked him if he got any the eyedrops I prescribed yesterday stating he did not and it appears he did not request them as they were when necessary's. Principal Problem: Undifferentiated schizophrenia (HCC) Diagnosis:   Patient Active Problem List   Diagnosis Date Noted  . Undifferentiated schizophrenia (HCC) [F20.3] 03/25/2015   Total Time spent with patient: 15 minutes  Past Psychiatric History:   Past Medical History:  Past Medical History  Diagnosis Date  . Anxiety   . Bipolar 1 disorder (HCC)    History reviewed. No pertinent past surgical history. Family History:  Family History  Problem Relation Age of Onset  . Colon cancer Neg Hx   . Prostate cancer Paternal Grandfather    Family Psychiatric  History:  Social History:  History  Alcohol Use No    Comment: Was a heavy drinker, quit 4 months ago      History  Drug Use No    Social History   Social History  . Marital Status: Single    Spouse Name: N/A  . Number of Children: 0  . Years of Education: N/A   Occupational History  . Unemployed     Social History Main Topics  . Smoking status: Current Some Day Smoker    Types: Cigarettes  . Smokeless tobacco: Never Used  . Alcohol Use: No     Comment: Was a heavy drinker, quit 4 months ago   . Drug Use: No  . Sexual Activity: Not Currently   Other Topics Concern  . None   Social History  Narrative   Additional Social History:                         Sleep: Good  Appetite:  Good  Current Medications: Current Facility-Administered Medications  Medication Dose Route Frequency Provider Last Rate Last Dose  . acetaminophen (TYLENOL) tablet 650 mg  650 mg Oral Q6H PRN Jimmy Footman, MD      . alum & mag hydroxide-simeth (MAALOX/MYLANTA) 200-200-20 MG/5ML suspension 30 mL  30 mL Oral Q4H PRN Jimmy Footman, MD   30 mL at 03/25/15 1825  . ARIPiprazole (ABILIFY) tablet 20 mg  20 mg Oral Daily Jimmy Footman, MD   20 mg at 03/25/15 1708  . diphenhydrAMINE (BENADRYL) capsule 50 mg  50 mg Oral Q8H PRN Jimmy Footman, MD       Or  . diphenhydrAMINE (BENADRYL) injection 50 mg  50 mg Intramuscular Q8H PRN Jimmy Footman, MD      . haloperidol (HALDOL) tablet 5 mg  5 mg Oral Q8H PRN Jimmy Footman, MD       Or  . haloperidol lactate (HALDOL) injection 5 mg  5 mg Intramuscular Q8H PRN Jimmy Footman, MD      . ibuprofen (ADVIL,MOTRIN) tablet 600 mg  600 mg Oral Q6H PRN Sue Lush  Hernandez-Gonzalez, MD   600 mg at 03/24/15 2244  . magnesium hydroxide (MILK OF MAGNESIA) suspension 30 mL  30 mL Oral Daily PRN Jimmy Footman, MD      . naphazoline-pheniramine (NAPHCON-A) 0.025-0.3 % ophthalmic solution 1-2 drop  1-2 drop Right Eye QID PRN Kerin Salen, MD      . pantoprazole (PROTONIX) EC tablet 40 mg  40 mg Oral QAC breakfast Jimmy Footman, MD   40 mg at 03/27/15 0655  . traZODone (DESYREL) tablet 50 mg  50 mg Oral QHS PRN Jimmy Footman, MD   50 mg at 03/26/15 2145    Lab Results:  No results found for this or any previous visit (from the past 48 hour(s)).  Physical Findings: AIMS: Facial and Oral Movements Muscles of Facial Expression: None, normal Lips and Perioral Area: None, normal Jaw: None, normal Tongue: None, normal,Extremity Movements Upper (arms,  wrists, hands, fingers): None, normal Lower (legs, knees, ankles, toes): None, normal, Trunk Movements Neck, shoulders, hips: None, normal, Overall Severity Severity of abnormal movements (highest score from questions above): None, normal Incapacitation due to abnormal movements: None, normal Patient's awareness of abnormal movements (rate only patient's report): No Awareness, Dental Status Current problems with teeth and/or dentures?: No Does patient usually wear dentures?: No  CIWA:    COWS:     Musculoskeletal: Strength & Muscle Tone: within normal limits Gait & Station: normal Patient leans: N/A  Psychiatric Specialty Exam: Review of Systems  Eyes:        feels like he has a stye  Respiratory: Positive for cough.   Psychiatric/Behavioral: Negative for depression, suicidal ideas, hallucinations, memory loss and substance abuse. The patient is not nervous/anxious and does not have insomnia.        Delusions  All other systems reviewed and are negative.   Blood pressure 151/73, pulse 74, temperature 98.4 F (36.9 C), temperature source Oral, resp. rate 20, height  (1.803 m), weight 97.523 kg (215 lb).Body mass index is 30 kg/(m^2).  General Appearance: Fairly Groomed  Patent attorney::  Fair  Speech:  Normal Rate  Volume:  Normal  Mood:  Okay  Affect:  Flat  Thought Process:  Disorganized  Orientation:  Full (Time, Place, and Person)  Thought Content:  Delusions  Suicidal Thoughts:  No  Homicidal Thoughts:  No  Memory:  Immediate;   Good Recent;   Good Remote;   Good  Judgement:  Impaired  Insight:  Lacking  Psychomotor Activity:  Normal  Concentration:  Fair  Recall:  [employer] of Knowledge:Fair  Language: Good  Akathisia:  Negative  Handed:    AIMS (if indicated):     Assets:  Social Support  ADL's:  Intact  Cognition: WNL  Sleep:  Number of Hours: 7.75   Treatment Plan Summary: Daily contact with patient to assess and evaluate symptoms and progress in  treatment, Medication management and Plan  Daily contact with patient to assess and evaluate symptoms and progress in treatment and Medication management   Schizophrenia: Patient's not want to take his Abilify. Thus we will start some oral Risperdal M tab, he will get 2 mg tonight and then start on 3 mg after that. Admitting physician did mention he might need a long-acting injectable at discharge given he has a history of noncompliance. Certainly consideration can be made to a Risperdal Consta shot for this purpose.  Insomnia: Continue trazodone but I will decrease it to 50 mg as he felt overly sedated this morning  with 100 mg.  Agitation: Haldol 5 mg by mouth and IM along with Benadryl by mouth and IM have been ordered for agitation.  GERD: Continue Protonix 40 mg by mouth daily  Precautions every 15 minute checks  Hospitalization and status patient is currently voluntary  Diet regular  Disposition: Will return home with parents  Discharge follow-up: Will follow up with [facility].  Labs: Lipid panel shows mildly increased cholesterol and low HDL. TSH is within the normal limits. Hemoglobin A1c is 5.2.   Eye irritation-Allergy eye drops 4 times per day for 2 days. Wallace Going 03/27/2015, 2:00 PM

## 2015-03-27 NOTE — [facility] Suicide Risk Assessment (Signed)
[facility] INPATIENT:  Family/Significant Other Suicide Prevention Education  Suicide Prevention Education:  Education Completed; Lupita Leash and Amire Leazer [phone] has been identified by the patient as the family member/significant other with whom the patient will be residing, and identified as the person(s) who will aid the patient in the event of a mental health crisis (suicidal ideations/suicide attempt).  With written consent from the patient, the family member/significant other has been provided the following suicide prevention education, prior to the and/or following the discharge of the patient.  The suicide prevention education provided includes the following:  Suicide risk factors  Suicide prevention and interventions  National Suicide Hotline telephone number  [facility] assessment telephone number  [facility] Emergency Assistance 911  [facility] and/or Residential Mobile Crisis Unit telephone number  Request made of family/significant other to:  Remove weapons (e.g., guns, rifles, knives), all items previously/currently identified as safety concern.    Remove drugs/medications (over-the-counter, prescriptions, illicit drugs), all items previously/currently identified as a safety concern.  The family member/significant other verbalizes understanding of the suicide prevention education information provided.  The family member/significant other agrees to remove the items of safety concern listed above.  [employer] MSW, LCSWA  03/27/2015, 10:32 AM

## 2015-03-27 NOTE — [facility] Group Notes (Signed)
[facility] Group Notes:  (Nursing/MHT/Case Management/Adjunct)  Date:  03/27/2015  Time:  12:07 AM  Type of Therapy:  Group Therapy  Participation Level:  Active  Participation Quality:  Appropriate  Affect:  Appropriate  Cognitive:  Appropriate  Insight:  Appropriate  Engagement in Group:  Engaged  Modes of Intervention:  Support  Summary of Progress/Problems:  Gilbert Meyers 03/27/2015, 12:07 AM

## 2015-03-27 NOTE — [facility] Group Notes (Signed)
[facility] LCSW Group Therapy  03/27/2015 3:05 PM  Type of Therapy:  Group Therapy  Participation Level:  Minimal  Participation Quality:  Attentive  Affect:  Appropriate  Cognitive:  Alert  Insight:  Limited  Engagement in Therapy:  Limited  Modes of Intervention:  Discussion, Education, Socialization and Support  Summary of Progress/Problems:  Mindfulness: Patient discussed mindfulness and relaxing techniques and why they are beneficial. Pt discussed ways to incorporate mindfulness in their lives. Pt practiced a mindfulness techique and discussed how it made them feel. Pt attended group and stayed most of the time. Pt sat quietly and listened to group members share.   Andreas Sobolewski L Lamis Behrmann MSW, LCSWA  03/27/2015, 3:05 PM   

## 2015-03-28 MED ORDER — RISPERIDONE 3 MG PO TBDP: 3.0000 mg | ORAL_TABLET | Freq: Every day | ORAL | Status: DC

## 2015-03-28 MED ORDER — TRAZODONE HCL 50 MG PO TABS: 50.0000 mg | ORAL_TABLET | Freq: Every day | ORAL | Status: DC

## 2015-03-28 MED ORDER — TRAZODONE HCL 50 MG PO TABS
50.0000 mg | ORAL_TABLET | Freq: Every day | ORAL | Status: DC
  Administered 2015-03-28: 50 mg via ORAL
  Filled 2015-03-28: qty 1

## 2015-03-28 MED ORDER — RISPERIDONE 3 MG PO TBDP
3.0000 mg | ORAL_TABLET | Freq: Every day | ORAL | Status: DC
Start: 2015-03-28 — End: 2015-03-28

## 2015-03-28 NOTE — Progress Notes (Signed)
Patient calm and cooperative during shift. Patient medication compliant and request Trazodone for sleep, good result. Patient denies SI and states his thoughts are clearer and hopes to go home soon. Q 15 min checks maintained.

## 2015-03-28 NOTE — Progress Notes (Signed)
[facility] MD Progress Note  03/28/2015 3:33 PM Gilbert Meyers  MRN:  [id number]  Subjective:  Gilbert Meyers reports much improvement since Risperdal was started the day ago. He denies any symptoms of depression, anxiety, or psychosis. He feels that his mood is stable. Paranoia has resolved. Thoughts are not racing. He feels very positive about Risperdal and its dose. He feels that he will be able to continue medication at home. There is a history of treatment noncompliance. There are no side effects. There are no somatic complaints. He participates in groups. He hopes to be discharged soon. His family did not visit this weekend so we don't know if the patient is at baseline. Family meeting a must.  Principal Problem: Undifferentiated schizophrenia (HCC) Diagnosis:   Patient Active Problem List   Diagnosis Date Noted  . Undifferentiated schizophrenia (HCC) [F20.3] 03/25/2015   Total Time spent with patient: 20 minutes  Past Psychiatric History: Bipolar disorder.  Past Medical History:  Past Medical History  Diagnosis Date  . Anxiety   . Bipolar 1 disorder (HCC)    History reviewed. No pertinent past surgical history. Family History:  Family History  Problem Relation Age of Onset  . Colon cancer Neg Hx   . Prostate cancer Paternal Grandfather    Family Psychiatric  History: See H&P. Social History:  History  Alcohol Use No    Comment: Was a heavy drinker, quit 4 months ago      History  Drug Use No    Social History   Social History  . Marital Status: Single    Spouse Name: N/A  . Number of Children: 0  . Years of Education: N/A   Occupational History  . Unemployed     Social History Main Topics  . Smoking status: Current Some Day Smoker    Types: Cigarettes  . Smokeless tobacco: Never Used  . Alcohol Use: No     Comment: Was a heavy drinker, quit 4 months ago   . Drug Use: No  . Sexual Activity: Not Currently   Other Topics Concern  . None   Social History Narrative    Additional Social History:                         Sleep: Fair  Appetite:  Fair  Current Medications: Current Facility-Administered Medications  Medication Dose Route Frequency Provider Last Rate Last Dose  . acetaminophen (TYLENOL) tablet 650 mg  650 mg Oral Q6H PRN Jimmy Footman, MD      . alum & mag hydroxide-simeth (MAALOX/MYLANTA) 200-200-20 MG/5ML suspension 30 mL  30 mL Oral Q4H PRN Jimmy Footman, MD   30 mL at 03/25/15 1825  . diphenhydrAMINE (BENADRYL) capsule 50 mg  50 mg Oral Q8H PRN Jimmy Footman, MD       Or  . diphenhydrAMINE (BENADRYL) injection 50 mg  50 mg Intramuscular Q8H PRN Jimmy Footman, MD      . haloperidol (HALDOL) tablet 5 mg  5 mg Oral Q8H PRN Jimmy Footman, MD       Or  . haloperidol lactate (HALDOL) injection 5 mg  5 mg Intramuscular Q8H PRN Jimmy Footman, MD      . ibuprofen (ADVIL,MOTRIN) tablet 600 mg  600 mg Oral Q6H PRN Jimmy Footman, MD   600 mg at 03/24/15 2244  . magnesium hydroxide (MILK OF MAGNESIA) suspension 30 mL  30 mL Oral Daily PRN Jimmy Footman, MD      . March Rummage (  NAPHCON-A) 0.025-0.3 % ophthalmic solution 1-2 drop  1-2 drop Right Eye TID Kerin Salen, MD   2 drop at 03/28/15 0907  . pantoprazole (PROTONIX) EC tablet 40 mg  40 mg Oral QAC breakfast Jimmy Footman, MD   40 mg at 03/28/15 0907  . risperiDONE (RISPERDAL M-TABS) disintegrating tablet 3 mg  3 mg Oral QHS Kerin Salen, MD      . traZODone (DESYREL) tablet 50 mg  50 mg Oral QHS PRN Jimmy Footman, MD   50 mg at 03/27/15 2151    Lab Results: No results found for this or any previous visit (from the past 48 hour(s)).  Physical Findings: AIMS: Facial and Oral Movements Muscles of Facial Expression: None, normal Lips and Perioral Area: None, normal Jaw: None, normal Tongue: None, normal,Extremity Movements Upper (arms, wrists,  hands, fingers): None, normal Lower (legs, knees, ankles, toes): None, normal, Trunk Movements Neck, shoulders, hips: None, normal, Overall Severity Severity of abnormal movements (highest score from questions above): None, normal Incapacitation due to abnormal movements: None, normal Patient's awareness of abnormal movements (rate only patient's report): No Awareness, Dental Status Current problems with teeth and/or dentures?: No Does patient usually wear dentures?: No  CIWA:    COWS:     Musculoskeletal: Strength & Muscle Tone: within normal limits Gait & Station: normal Patient leans: N/A  Psychiatric Specialty Exam: Review of Systems  All other systems reviewed and are negative.   Blood pressure 114/68, pulse 97, temperature 97.8 F (36.6 C), temperature source Oral, resp. rate 20, height  (1.803 m), weight 97.523 kg (215 lb).Body mass index is 30 kg/(m^2).  General Appearance: Casual  Eye Contact::  Fair  Speech:  Clear and Coherent  Volume:  Normal  Mood:  Euthymic  Affect:  Appropriate  Thought Process:  Goal Directed  Orientation:  Full (Time, Place, and Person)  Thought Content:  WDL  Suicidal Thoughts:  No  Homicidal Thoughts:  No  Memory:  Immediate;   Fair Recent;   Fair Remote;   Fair  Judgement:  Impaired  Insight:  Shallow  Psychomotor Activity:  Normal  Concentration:  Fair  Recall:  [employer] of Knowledge:Fair  Language: Fair  Akathisia:  No  Handed:  Right  AIMS (if indicated):     Assets:  Communication Skills Desire for Improvement Financial Resources/Insurance Housing Physical Health Resilience Social Support  ADL's:  Intact  Cognition: WNL  Sleep:  Number of Hours: 6.75   Treatment Plan Summary: Daily contact with patient to assess and evaluate symptoms and progress in treatment and Medication management   Gilbert Meyers is a 30 year old male with a history of schizoaffective disorder admitted for worsening of symptoms in the  context of treatment noncompliance.   1. Schizophrenia. The patient refused Abilify and was started on Risperdal. He seems like it and tolerates it well. We will offer injectable longer-lasting antipsychotic to improve compliance.   2. Insomnia. He responded well to trazodone.  3. Agitation. This has resolved. No unwanted behaviors were observed.  4. GERD. We continued Protonix.  5. Metabolic syndrome. Lipid panel is elevated. Hemoglobin A1c and TSH are normal. Prolactin 22.9. The patient was referred to address dyslipidemia with diet first.  6. Eye irritation. Most likely due to allergy. We gave eye drops 4 times per day for 2 days.  7. Disposition. He will be discharged to home with his parents. He will follow up with [facility].     Aften Lipsey 03/28/2015, 3:33 PM

## 2015-03-28 NOTE — Progress Notes (Signed)
He was pleasant & cooperative.Denies suicidal and homicidal ideation.Compliant with medications.Appropriate with staff & peers.Attended groups.Appetite good.Looking forward for discharge tomorrow.

## 2015-03-28 NOTE — Plan of Care (Signed)
Problem: [facility] Participation in Recreation Therapeutic Interventions Goal: STG-Patient will identify at least five coping skills for ** STG: Coping Skills - Within 4 treatment sessions, patient will verbalize at least 5 coping skills for anger in each of 2 treatment sessions to increase anger management skills post d/c.  Outcome: Progressing Treatment Session 1; Completed 1 out of 2: At approximately 2:05 pm, LRT met with patient in patient room. Patient verbalized 5 coping skills for anger. Patient verbalized what triggers him to get angry, and how his body responds to anger. LRT provided suggestions on how patient can remind himself to use his healthy coping skills. Intervention Used: Coping Skills worksheet  [facility], LRT/CTRS 01.23.17 2:16 pm

## 2015-03-28 NOTE — Progress Notes (Signed)
Recreation Therapy Notes  Date: 01.23.17 Time: 3:00 pm Location: Craft Room  Group Topic: Self-expression  Goal Area(s) Addresses:  Patient will effectively use art as a means of self-expression. Patient will recognize positive benefit of self-expression. Patient will be able to identify one emotion experienced during group session. Patient will identify use of art as a coping skill.  Behavioral Response: Did not attend  Intervention: Two Faces of Me  Activity: Patients were given a blank face worksheet and instructed to draw or write on one half of the face how they were feeling when they were admitted and draw or write on the other half how they want to feel when they are d/c.  Education: LRT educated patients on different forms of self-expression.  Education Outcome: Patient did not attend group.   Clinical Observations/Feedback: Patient did not attend group.  Jacquelynn Cree, LRT/CTRS 03/28/2015 3:57 PM

## 2015-03-28 NOTE — Tx Team (Signed)
Interdisciplinary Treatment Plan Update (Adult)         Date: 03/28/2015   Time Reviewed: 9:30 AM   Progress in Treatment: Improving Attending groups: Yes  Participating in groups: Yes  Taking medication as prescribed: Yes  Tolerating medication: Yes  Family/Significant other contact made: No, CSW still assessing for appropriate contacts Patient understands diagnosis: Yes  Discussing patient identified problems/goals with staff: Yes  Medical problems stabilized or resolved: Yes  Denies suicidal/homicidal ideation: Yes  Issues/concerns per patient self-inventory: Yes  Other:   New problem(s) identified: N/A   Discharge Plan or Barriers: Pt plans to return home to [facility] and follow up with outpatient services with [facility] for outpatient therapy.   Reason for Continuation of Hospitalization:   Depression   Anxiety   Medication Stabilization   Comments: N/A   Estimated length of stay: 3-5 days    Patient is a 30 year old single Caucasian male with prior history of psychosis who was transferred to our behavioral health unit on January 19 from [facility] ER for further treatment and stabilization.  Patient presented to the Ed and reported.  "I have been thinking crazy stuff", "I'm not doing well". Patient tells me that 3 years ago he advised to President to attack [place] and [place]. As a result of these recommendations he thinks he started [employer] in the middle [place].  He denies having auditory or visual hallucinations. He denies having problems with mood, appetite, energy or concentration. He tells me that prior to admission he was not sleeping well. Denies any thoughts of suicidality or homicidality.  Patient follows up with [facility] but he has not been compliant with appointments or medications. He was last prescribed Risperdal and trazodone.  Per record the patient was hospitalized here in 2015. He was discharged with a diagnosis of bipolar disorder. Patient denies the use of alcohol,  illicit substances or abusing prescription medications. Patient lives in [place], [place].  Patient will benefit from crisis stabilization, medication evaluation, group therapy, and psycho education in addition to case management for discharge planning. Patient and CSW reviewed pt's identified goals and treatment plan. Pt verbalized understanding and agreed to treatment plan. ent says he smokes cigarettes but only about 7 cigarettes per month. Per records patient used to be a heavy drinker but has cut down the consumption of alcohol.        Review of initial/current patient goals per problem list:  1. Goal(s): Patient will participate in aftercare plan   Met: Yes  Target date: 3-5 days post admission date   As evidenced by: Patient will participate within aftercare plan AEB aftercare provider and housing plan at discharge being identified.   1/23: Pt plans to return home to [place] and follow up with outpatient services with [facility] for outpatient therapy.    2. Goal (s): Patient will exhibit decreased depressive symptoms and suicidal ideations.   Met: No  Target date: 3-5 days post admission date   As evidenced by: Patient will utilize self-rating of depression at 3 or below and demonstrate decreased signs of depression or be deemed stable for discharge by MD.   1.23: Goal progressing. Pt denies SI    3. Goal(s): Patient will demonstrate decreased signs and symptoms of anxiety.   Met: No  Target date: 3-5 days post admission date   As evidenced by: Patient will utilize self-rating of anxiety at 3 or below and demonstrated decreased signs of anxiety, or be deemed stable for discharge by MD   1/23: Goal progressing.  Pt denies SI/HI         Attendees:  Patient:  Family:  Physician: Montel Culver, MD     03/28/2015 9:30 AM  Nursing: Doristine Mango, RN     03/28/2015 9:30 AM  Clinical Social Worker: Marylou Flesher, [place]  03/28/2015 9:30 AM  Clinical Social Worker: Carmell Austria, [place]  03/28/2015 9:30 AM  Nursing: Terressa Koyanagi    03/28/2015 9:30 AM  Other:        03/28/2015 9:30 AM  Other:        03/28/2015 9:30 AM

## 2015-03-29 MED ORDER — RISPERIDONE 3 MG PO TABS: 3.0000 mg | ORAL_TABLET | Freq: Every day | ORAL | Status: DC

## 2015-03-29 MED ORDER — TRAZODONE HCL 50 MG PO TABS: 50.0000 mg | ORAL_TABLET | Freq: Every day | ORAL | Status: DC

## 2015-03-29 NOTE — Progress Notes (Signed)
Patient calm and cooperative during shift. Patient medication compliant. Patient denies SI but does endorse anxiety and sleeplessness around 0330. Patient encouraged to use coping skills. Patient showered and was able to lay back down. Safety maintained. Will continue to monitor.

## 2015-03-29 NOTE — Progress Notes (Signed)
Patient denies SI/HI, denies A/V hallucinations. Patient verbalizes understanding of discharge instructions, follow up care and prescriptions. Patient given all belongings from  [place].7 days medicine given to patient. Patient escorted out by staff, transported by family.

## 2015-03-29 NOTE — Plan of Care (Signed)
Problem: [facility] Participation in Recreation Therapeutic Interventions Goal: STG-Patient will identify at least five coping skills for ** STG: Coping Skills - Within 4 treatment sessions, patient will verbalize at least 5 coping skills for anger in each of 2 treatment sessions to increase anger management skills post d/c.  Outcome: Completed/Met Date Met:  03/29/15 Treatment Session 2; Completed 2 out of 2: At approximately 11:35 am, LRT met with patient in patient room. Patient verbalized 5 coping skills for anger. LRT encouraged patient to use his coping skills when he felt himself getting angry to help calm himself down. Intervention Used: Coping Skills worksheet  [facility], LRT/CTRS 01.24.17 1:44 pm

## 2015-03-29 NOTE — Progress Notes (Signed)
Recreation Therapy Notes  INPATIENT RECREATION TR PLAN  Patient Details Name: Kohan Azizi MRN: [id number] DOB: 03-17-1985 Today's Date: 03/29/2015  Rec Therapy Plan Is patient appropriate for Therapeutic Recreation?: Yes Treatment times per week: At least once a week TR Treatment/Interventions: 1:1 session, Group participation (Comment) (Appropriate participation in daily recreation therapy tx)  Discharge Criteria Pt will be discharged from therapy if:: Treatment goals are met, Discharged Treatment plan/goals/alternatives discussed and agreed upon by:: Patient/family  Discharge Summary Short term goals set: See Care Plan Short term goals met: Complete Progress toward goals comments: One-to-one attended One-to-one attended: Anger Management Reason goals not met: N/A Therapeutic equipment acquired: None Reason patient discharged from therapy: Discharge from hospital Pt/family agrees with progress & goals achieved: Yes Date patient discharged from therapy: 03/29/15   Leonette [facility], LRT/CTRS 03/29/2015, 1:45 PM

## 2015-03-29 NOTE — BHH Suicide Risk Assessment (Signed)
[facility] Discharge Suicide Risk Assessment   Principal Problem: Undifferentiated schizophrenia [facility]) Discharge Diagnoses:  Patient Active Problem List   Diagnosis Date Noted  . Undifferentiated schizophrenia (HCC) [F20.3] 03/25/2015    Total Time spent with patient: 30 minutes  Musculoskeletal: Strength & Muscle Tone: within normal limits Gait & Station: normal Patient leans: N/A  Psychiatric Specialty Exam: Review of Systems  All other systems reviewed and are negative.   Blood pressure 112/95, pulse 91, temperature 98.1 F (36.7 C), temperature source Oral, resp. rate 20, height  (1.803 m), weight 97.523 kg (215 lb).Body mass index is 30 kg/(m^2).  General Appearance: Casual  Eye Contact::  Fair  Speech:  Clear and Coherent409  Volume:  Normal  Mood:  Euthymic  Affect:  Appropriate  Thought Process:  Goal Directed  Orientation:  Full (Time, Place, and Person)  Thought Content:  WDL  Suicidal Thoughts:  No  Homicidal Thoughts:  No  Memory:  Immediate;   Fair Recent;   Fair Remote;   Fair  Judgement:  Fair  Insight:  Fair  Psychomotor Activity:  Normal  Concentration:  Fair  Recall:  [employer] of Knowledge:Fair  Language: Fair  Akathisia:  No  Handed:  Right  AIMS (if indicated):     Assets:  Communication Skills Desire for Improvement Housing Physical Health Resilience Social Support  Sleep:  Number of Hours: 5  Cognition: WNL  ADL's:  Intact   Mental Status Per Nursing Assessment::   On Admission:  NA  Demographic Factors:  Male, Adolescent or young adult and Caucasian  Loss Factors: NA  Historical Factors: Impulsivity  Risk Reduction Factors:   Sense of responsibility to family, Employed, Living with another person, especially a relative and Positive social support  Continued Clinical Symptoms:  Bipolar Disorder:   Mixed State  Cognitive Features That Contribute To Risk:  None    Suicide Risk:  Minimal: No identifiable suicidal  ideation.  Patients presenting with no risk factors but with morbid ruminations; may be classified as minimal risk based on the severity of the depressive symptoms  Follow-up Information    Follow up with [facility].   Why:  Please arrive for your hospital follow up appointment at 9am on Monday January 23rd with i.d. or drivers license, social security card, proof of income, proof of insurance if you have it.  Please call to reschedule if necessary.   Contact information:   [address] [place], [place] [address] Phone: [phone] Fax: [phone]      Plan Of Care/Follow-up recommendations:  Activity:  As tolerated Diet:  Regular Other:  Keep follow-up appointments  Kristine Linea, MD 03/29/2015, 10:07 AM

## 2015-03-29 NOTE — Tx Team (Signed)
Interdisciplinary Treatment Plan Update (Adult)         Date: 03/29/2015   Time Reviewed: 9:30 AM   Progress in Treatment: Improving Attending groups: Yes  Participating in groups: Yes  Taking medication as prescribed: Yes  Tolerating medication: Yes  Family/Significant other contact:  CSW contacted pt's mother Patient understands diagnosis: Yes  Discussing patient identified problems/goals with staff: Yes  Medical problems stabilized or resolved: Yes  Denies suicidal/homicidal ideation: Yes  Issues/concerns per patient self-inventory: Yes  Other:   New problem(s) identified: N/A   Discharge Plan or Barriers: Pt plans to return home to [facility] and follow up with outpatient services with [facility] for outpatient therapy.   Reason for Continuation of Hospitalization:   Depression   Anxiety   Medication Stabilization   Comments: N/A   Estimated date of discharge: 03/29/15    Patient is a 30 year old single Caucasian male with prior history of psychosis who was transferred to our behavioral health unit on January 19 from [facility] ER for further treatment and stabilization.  Patient presented to the Ed and reported.  "I have been thinking crazy stuff", "I'm not doing well". Patient tells me that 3 years ago he advised to President to attack [place] and [place]. As a result of these recommendations he thinks he started [place] in the middle [place].  He denies having auditory or visual hallucinations. He denies having problems with mood, appetite, energy or concentration. He tells me that prior to admission he was not sleeping well. Denies any thoughts of suicidality or homicidality.  Patient follows up with [facility] but he has not been compliant with appointments or medications. He was last prescribed Risperdal and trazodone.  Per record the patient was hospitalized here in 2015. He was discharged with a diagnosis of bipolar disorder. Patient denies the use of alcohol, illicit substances or  abusing prescription medications. Patient lives in [place], [place].  Patient will benefit from crisis stabilization, medication evaluation, group therapy, and psycho education in addition to case management for discharge planning. Patient and CSW reviewed pt's identified goals and treatment plan. Pt verbalized understanding and agreed to treatment plan. ent says he smokes cigarettes but only about 7 cigarettes per month. Per records patient used to be a heavy drinker but has cut down the consumption of alcohol.        Review of initial/current patient goals per problem list:  1. Goal(s): Patient will participate in aftercare plan   Met: Yes  Target date: 3-5 days post admission date   As evidenced by: Patient will participate within aftercare plan AEB aftercare provider and housing plan at discharge being identified.   1/23: Pt plans to return home to [place] and follow up with outpatient services with [facility] for outpatient therapy.    2. Goal (s): Patient will exhibit decreased depressive symptoms and suicidal ideations.   Met: Adequate for discharge per MD.  Target date: 3-5 days post admission date   As evidenced by: Patient will utilize self-rating of depression at 3 or below and demonstrate decreased signs of depression or be deemed stable for discharge by MD.   1.23: Goal progressing. Pt denies SI  1/24: Adequate for discharge per MD. Pt denies SI.  Pt reports he is safe for discharge    3. Goal(s): Patient will demonstrate decreased signs and symptoms of anxiety.   Met: Adequate for discharge per MD.  Target date: 3-5 days post admission date   As evidenced by: Patient will utilize self-rating of anxiety at  3 or below and demonstrated decreased signs of anxiety, or be deemed stable for discharge by MD   1/23: Goal progressing.  Pt denies SI/HI  1/24: Adequate for discharge per MD.  Pt reports baseline symptoms of anxiety         Attendees:  Patient:   Family:  Physician: Montel Culver, MD     03/29/2015 9:30 AM  Nursing: Doristine Mango, RN     03/29/2015 9:30 AM  Clinical Social Worker: Marylou Flesher, [place]  03/29/2015 9:30 AM  Clinical Social Worker: Carmell Austria, [place]  03/29/2015 9:30 AM  Nursing: Nicanor Bake, RN    03/29/2015 9:30 AM  Other:        03/29/2015 9:30 AM  Other:        03/29/2015 9:30 AM

## 2015-03-29 NOTE — Progress Notes (Signed)
  [facility] Adult Case Management Discharge Plan :  Will you be returning to the same living situation after discharge:  Yes,  pt will be returning home to live with his parents At discharge, do you have transportation home?: Yes,  pt will be picked up by his mother Do you have the ability to pay for your medications: Yes,  pt will be provided with prescriptions at discharge  Release of information consent forms completed and in the chart;  Patient's signature needed at discharge.  Patient to Follow up at: Follow-up Information    Follow up with [facility] [place].   Why:  Please arrive for your hospital follow up appointment at 9am on Monday January 30th.  Please bring i.d. or drivers license, social security card, proof of income, proof of insurance if you have it.  Please call to reschedule if necessary.   Contact information:   [address] [place], [place] [address] Phone: [phone] Fax: [phone]      Next level of care provider has access to [facility] Link:no  Safety Planning and Suicide Prevention discussed: Yes,  completed with pt  Have you used any form of tobacco in the last 30 days? (Cigarettes, Smokeless Tobacco, Cigars, and/or Pipes): Yes  Has patient been referred to the Quitline?: Patient refused referral  Patient has been referred for addiction treatment: Yes  Dorothe Pea Adriella Essex 03/29/2015, 12:18 PM

## 2015-03-29 NOTE — Discharge Summary (Signed)
Physician Discharge Summary Note  Patient:  Gilbert Meyers is an 30 y.o., male MRN:  [id number] DOB:  [birth year] Patient phone:  [phone] (home)  Patient address:   [address] [place] [address],  Total Time spent with patient: 30 minutes  Date of Admission:  03/24/2015 Date of Discharge: 03/29/2015  Reason for Admission:   psychotc break.  Patient is a 30 year old single Caucasian male with prior history of psychosis who was transferred to our behavioral health unit on January 19 from [facility] ER for further treatment and stabilization.  Patient presented there yesterday reporting "I have been thinking crazy stuff", "I'm not doing well". Patient tells me that 3 years ago he advised to President to attack [place] and [place]. As a result of these recommendations [place]  He denies having auditory or visual hallucinations. He denies having problems with mood, appetite, energy or concentration. He tells me that prior to admission he was not sleeping well. Denies any thoughts of suicidality or homicidality.  Patient follows up with [facility] but he has not been compliant with appointments or medications. He was last prescribed Risperdal and trazodone.  Per record the patient was hospitalized here in 2015. He was discharged with a diagnosis of bipolar disorder.  Substance abuse history patient denies the use of alcohol, illicit substances or abusing prescription medications. Patient says he smokes cigarettes but only about 7 cigarettes per month. Per records patient used to be a heavy drinker but has cut down the consumption of alcohol.  Associated Signs/Symptoms: Depression Symptoms: denies (Hypo) Manic Symptoms: denies Anxiety Symptoms: denies Psychotic Symptoms: Delusions, PTSD Symptoms: NA   Past Psychiatric History: Patient is unsure about his diagnosis. Per records looks like when hospitalized here 2015 it was thought the patient had  bipolar disorder. Patient currently follows up at [facility]. He has been noncompliant with medications. Stated that the most recent medications were Risperdal and trazodone. Patient has also been hospitalized at [facility] and [facility].  Past Medical History: denies history of seizures, head trauma or chronic medical conditions  Principal Problem: Undifferentiated schizophrenia [facility]) Discharge Diagnoses: Patient Active Problem List   Diagnosis Date Noted  . Undifferentiated schizophrenia (HCC) [F20.3] 03/25/2015    Past Psychiatric History: psychosis and mood instability.  Past Medical History:  Past Medical History  Diagnosis Date  . Anxiety   . Bipolar 1 disorder (HCC)    History reviewed. No pertinent past surgical history. Family History:  Family History  Problem Relation Age of Onset  . Colon cancer Neg Hx   . Prostate cancer Paternal Grandfather    Family Psychiatric  History:  noneported. Social History:  History  Alcohol Use No    Comment: Was a heavy drinker, quit 4 months ago      History  Drug Use No    Social History   Social History  . Marital Status: Single    Spouse Name: N/A  . Number of Children: 0  . Years of Education: N/A   Occupational History  . Unemployed     Social History Main Topics  . Smoking status: Current Some Day Smoker    Types: Cigarettes  . Smokeless tobacco: Never Used  . Alcohol Use: No     Comment: Was a heavy drinker, quit 4 months ago   . Drug Use: No  . Sexual Activity: Not Currently   Other Topics Concern  . None   Social History Narrative  Hospital Course:    Mr. Gilbert Meyers is a 30 year old male with a history of schizoaffective disorder admitted for worsening of symptoms in the context of treatment noncompliance.   1. Schizophrenia. The patient refused Abilify and was started on Risperdal. He seems to like it and tolerates it well. He declined injectable long-lasting antipsychotic to  improve compliance.   2. Insomnia. He responded well to trazodone.  3. Agitation. This has resolved. No unwanted behaviors were observed.  4. GERD. We continued Protonix.  5. Metabolic syndrome. Lipid panel is elevated. Hemoglobin A1c and TSH are normal. Prolactin 22.9. The patient was referred to address dyslipidemia with diet first.  6. Eye irritation. Most likely due to allergy. We gave eye drops 4 times per day for 2 days.  7. Disposition. He was discharged to home with his parents. He will follow up with [facility].    Physical Findings: AIMS: Facial and Oral Movements Muscles of Facial Expression: None, normal Lips and Perioral Area: None, normal Jaw: None, normal Tongue: None, normal,Extremity Movements Upper (arms, wrists, hands, fingers): None, normal Lower (legs, knees, ankles, toes): None, normal, Trunk Movements Neck, shoulders, hips: None, normal, Overall Severity Severity of abnormal movements (highest score from questions above): None, normal Incapacitation due to abnormal movements: None, normal Patient's awareness of abnormal movements (rate only patient's report): No Awareness, Dental Status Current problems with teeth and/or dentures?: No Does patient usually wear dentures?: No  CIWA:    COWS:     Musculoskeletal: Strength & Muscle Tone: within normal limits Gait & Station: normal Patient leans: N/A  Psychiatric Specialty Exam: Review of Systems  All other systems reviewed and are negative.   Blood pressure 112/95, pulse 91, temperature 98.1 F (36.7 C), temperature source Oral, resp. rate 20, height  (1.803 m), weight 97.523 kg (215 lb).Body mass index is 30 kg/(m^2).  See SRA.                                                  Sleep:  Number of Hours: 5   Have you used any form of tobacco in the last 30 days? (Cigarettes, Smokeless Tobacco, Cigars, and/or Pipes): Yes  Has this patient used any form of tobacco in the last 30  days? (Cigarettes, Smokeless Tobacco, Cigars, and/or Pipes) Yes, Yes, A prescription for an FDA-approved tobacco cessation medication was offered at discharge and the patient refused  Metabolic Disorder Labs:  Lab Results  Component Value Date   HGBA1C 5.2 03/25/2015   Lab Results  Component Value Date   PROLACTIN 22.9* 03/25/2015   Lab Results  Component Value Date   CHOL 208* 03/25/2015   TRIG 168* 03/25/2015   HDL 38* 03/25/2015   CHOLHDL 5.5 03/25/2015   VLDL 34 03/25/2015   LDLCALC 136* 03/25/2015    See Psychiatric Specialty Exam and Suicide Risk Assessment completed by Attending Physician prior to discharge.  Discharge destination:  Home  Is patient on multiple antipsychotic therapies at discharge:  No   Has Patient had three or more failed trials of antipsychotic monotherapy by history:  No  Recommended Plan for Multiple Antipsychotic Therapies: NA  Discharge Instructions    Diet - low sodium heart healthy    Complete by:  As directed      Increase activity slowly    Complete by:  As directed  Medication List    TAKE these medications      Indication   esomeprazole 20 MG capsule  Commonly known as:  NEXIUM  Take 20 mg by mouth daily as needed (acid reflux).      ferrous sulfate 325 (65 FE) MG tablet  Take 325 mg by mouth daily with breakfast.      risperiDONE 3 MG tablet  Commonly known as:  RISPERDAL  Take 1 tablet (3 mg total) by mouth at bedtime.   Indication:  Manic-Depression, Schizophrenia     traZODone 50 MG tablet  Commonly known as:  DESYREL  Take 1 tablet (50 mg total) by mouth at bedtime.   Indication:  Trouble Sleeping           Follow-up Information    Follow up with [facility].   Why:  Please arrive for your hospital follow up appointment at 9am on Monday January 30th.  Please bring i.d. or drivers license, social security card, proof of income, proof of insurance if you have it.  Please call to reschedule  if necessary.   Contact information:   [address] [place], [place] [address] Phone: [phone] Fax: [phone]      Follow-up recommendations:  Activity:   as tolerated. Diet:  reguar. Other:  keep follow up appointments.  Comments:    Signed: Anetha Slagel 03/29/2015, 5:21 PM

## 2015-04-07 ENCOUNTER — Emergency Department (HOSPITAL_COMMUNITY): Payer: Self-pay

## 2015-04-07 ENCOUNTER — Emergency Department (HOSPITAL_COMMUNITY)
Admission: EM | Admit: 2015-04-07 | Discharge: 2015-04-07 | Disposition: A | Discharge status: Home / Self Care | Payer: Self-pay | Attending: Emergency Medicine | Admitting: Emergency Medicine

## 2015-04-07 ENCOUNTER — Encounter (HOSPITAL_COMMUNITY): Payer: Self-pay | Admitting: *Deleted

## 2015-04-07 DIAGNOSIS — R0602 Shortness of breath: Secondary | ICD-10-CM | POA: Insufficient documentation

## 2015-04-07 DIAGNOSIS — F419 Anxiety disorder, unspecified: Secondary | ICD-10-CM | POA: Insufficient documentation

## 2015-04-07 DIAGNOSIS — Z79899 Other long term (current) drug therapy: Secondary | ICD-10-CM | POA: Insufficient documentation

## 2015-04-07 DIAGNOSIS — R079 Chest pain, unspecified: Secondary | ICD-10-CM | POA: Insufficient documentation

## 2015-04-07 DIAGNOSIS — F17210 Nicotine dependence, cigarettes, uncomplicated: Secondary | ICD-10-CM | POA: Insufficient documentation

## 2015-04-07 DIAGNOSIS — F319 Bipolar disorder, unspecified: Secondary | ICD-10-CM | POA: Insufficient documentation

## 2015-04-07 DIAGNOSIS — R1110 Vomiting, unspecified: Secondary | ICD-10-CM | POA: Insufficient documentation

## 2015-04-07 LAB — CBC
HEMATOCRIT: 43.1 % (ref 39.0–52.0)
HEMOGLOBIN: 15.2 g/dL (ref 13.0–17.0)
MCH: 30.8 pg (ref 26.0–34.0)
MCHC: 35.3 g/dL (ref 30.0–36.0)
MCV: 87.4 fL (ref 78.0–100.0)
Platelets: 285 10*3/uL (ref 150–400)
RBC: 4.93 MIL/uL (ref 4.22–5.81)
RDW: 13.1 % (ref 11.5–15.5)
WBC: 11.5 10*3/uL — AB (ref 4.0–10.5)

## 2015-04-07 LAB — BASIC METABOLIC PANEL
ANION GAP: 10 (ref 5–15)
BUN: 10 mg/dL (ref 6–20)
CHLORIDE: 104 mmol/L (ref 101–111)
CO2: 25 mmol/L (ref 22–32)
Calcium: 9.5 mg/dL (ref 8.9–10.3)
Creatinine, Ser: 0.89 mg/dL (ref 0.61–1.24)
GFR calc Af Amer: >60 mL/min (ref >60)
GFR calc non Af Amer: >60 mL/min (ref >60)
Glucose, Bld: 102 mg/dL — ABNORMAL HIGH (ref 65–99)
POTASSIUM: 4.1 mmol/L (ref 3.5–5.1)
SODIUM: 139 mmol/L (ref 135–145)

## 2015-04-07 LAB — I-STAT TROPONIN, ED: Troponin i, poc: 0 ng/mL (ref 0.00–0.08)

## 2015-04-07 MED ORDER — KETOROLAC TROMETHAMINE 30 MG/ML IJ SOLN
30.0000 mg | Freq: Once | INTRAMUSCULAR | Status: AC
  Administered 2015-04-07: 30 mg via INTRAVENOUS
  Filled 2015-04-07: qty 1

## 2015-04-07 NOTE — Discharge Instructions (Signed)
Nonspecific Chest Pain Mr. Hearn, your work up today is normal.  See a primary care doctor within 3 days for close follow-up. If any symptoms worsen come back to emergency department immediately. Thank you. It is often hard to find the cause of chest pain. There is always a chance that your pain could be related to something serious, such as a heart attack or a blood clot in your lungs. Chest pain can also be caused by conditions that are not life-threatening. If you have chest pain, it is very important to follow up with your doctor.  HOME CARE  If you were prescribed an antibiotic medicine, finish it all even if you start to feel better.  Avoid any activities that cause chest pain.  Do not use any tobacco products, including cigarettes, chewing tobacco, or electronic cigarettes. If you need help quitting, ask your doctor.  Do not drink alcohol.  Take medicines only as told by your doctor.  Keep all follow-up visits as told by your doctor. This is important. This includes any further testing if your chest pain does not go away.  Your doctor may tell you to keep your head raised (elevated) while you sleep.  Make lifestyle changes as told by your doctor. These may include:  Getting regular exercise. Ask your doctor to suggest some activities that are safe for you.  Eating a heart-healthy diet. Your doctor or a diet specialist (dietitian) can help you to learn healthy eating options.  Maintaining a healthy weight.  Managing diabetes, if necessary.  Reducing stress. GET HELP IF:  Your chest pain does not go away, even after treatment.  You have a rash with blisters on your chest.  You have a fever. GET HELP RIGHT AWAY IF:  Your chest pain is worse.  You have an increasing cough, or you cough up blood.  You have severe belly (abdominal) pain.  You feel extremely weak.  You pass out (faint).  You have chills.  You have sudden, unexplained chest discomfort.  You have  sudden, unexplained discomfort in your arms, back, neck, or jaw.  You have shortness of breath at any time.  You suddenly start to sweat, or your skin gets clammy.  You feel nauseous.  You vomit.  You suddenly feel light-headed or dizzy.  Your heart begins to beat quickly, or it feels like it is skipping beats. These symptoms may be an emergency. Do not wait to see if the symptoms will go away. Get medical help right away. Call your local emergency services (911 in the [place] Do not drive yourself to the hospital.   This information is not intended to replace advice given to you by your health care provider. Make sure you discuss any questions you have with your health care provider.   Document Released: 08/08/2007 Document Revised: 03/12/2014 Document Reviewed: 09/25/2013 Elsevier Interactive Patient Education [employer]

## 2015-04-07 NOTE — ED Notes (Signed)
Pt states he started having chest pain a little after waking up this morning, he has thrown up twice and had shortness of breath. Pt says he thinks he may have taken too much medicine to try to treat the chest pain, states he took 3 fish oil, 2 allermax, 2 vitamin c, 3 iron tablets, 2 nexium, 2 xantac, and a trazadone and risperodone through the day. Pt reports that his head "is spinning and my ears feel clogged."

## 2015-04-07 NOTE — ED Notes (Signed)
Pt states "feels like he's starting to calm down and wants to sleep."

## 2015-04-07 NOTE — ED Provider Notes (Signed)
CSN: [id number]     Arrival date & time 04/07/15  0212 History  By signing my name below, I, Freida Busman, attest that this documentation has been prepared under the direction and in the presence of Tomasita Crumble, MD . Electronically Signed: Freida Busman, Scribe. 04/07/2015. 4:05 AM.    Chief Complaint  Patient presents with  . Chest Pain    The history is provided by the patient. No language interpreter was used.     HPI Comments:  Gilbert Meyers is a 30 y.o. male who presents to the Emergency Department complaining of "nagging" CP x 1 day. He reports associated mild SOB and 1 induced episode of vomiting. He states he took 2 zantac pills without relief and then 2 nexium pills with relief of his pain. He then took his prescribed trazodone and risperidone and began to feel like his nose and ears were blocked and somewhat anxious after realizing how much medication he had ingested in such a short timeframe..    Past Medical History  Diagnosis Date  . Anxiety   . Bipolar 1 disorder (HCC)    History reviewed. No pertinent past surgical history. Family History  Problem Relation Age of Onset  . Colon cancer Neg Hx   . Prostate cancer Paternal Grandfather    Social History  Substance Use Topics  . Smoking status: Current Some Day Smoker    Types: Cigarettes  . Smokeless tobacco: Never Used  . Alcohol Use: No     Comment: Was a heavy drinker, quit 4 months ago     Review of Systems  10 systems reviewed and all are negative for acute change except as noted in the HPI.  Allergies  Review of patient's allergies indicates no known allergies.  Home Medications   Prior to Admission medications   Medication Sig Start Date End Date Taking? Authorizing Provider  esomeprazole (NEXIUM) 20 MG capsule Take 20 mg by mouth daily as needed (acid reflux).    Historical Provider, MD  ferrous sulfate 325 (65 FE) MG tablet Take 325 mg by mouth daily with breakfast.    Historical Provider, MD   risperiDONE (RISPERDAL) 3 MG tablet Take 1 tablet (3 mg total) by mouth at bedtime. 03/29/15   Shari Prows, MD  traZODone (DESYREL) 50 MG tablet Take 1 tablet (50 mg total) by mouth at bedtime. 03/29/15   Jolanta B Pucilowska, MD   BP 121/73 mmHg  Pulse 92  Temp(Src) 97.6 F (36.4 C) (Oral)  Resp 21  Ht  (1.778 m)  Wt 245 lb (111.131 kg)  BMI 35.15 kg/m2  SpO2 96% Physical Exam  Constitutional: He is oriented to person, place, and time. Vital signs are normal. He appears well-developed and well-nourished.  Non-toxic appearance. He does not appear ill. No distress.  HENT:  Head: Normocephalic and atraumatic.  Nose: Nose normal.  Mouth/Throat: Oropharynx is clear and moist. No oropharyngeal exudate.  Eyes: Conjunctivae and EOM are normal. Pupils are equal, round, and reactive to light. No scleral icterus.  Neck: Normal range of motion. Neck supple. No tracheal deviation, no edema, no erythema and normal range of motion present. No thyroid mass and no thyromegaly present.  Cardiovascular: Normal rate, regular rhythm, S1 normal, S2 normal, normal heart sounds, intact distal pulses and normal pulses.  Exam reveals no gallop and no friction rub.   No murmur heard. Pulmonary/Chest: Effort normal and breath sounds normal. No respiratory distress. He has no wheezes. He has no rhonchi. He has  no rales.  Abdominal: Soft. Normal appearance and bowel sounds are normal. He exhibits no distension, no ascites and no mass. There is no hepatosplenomegaly. There is no tenderness. There is no rebound, no guarding and no CVA tenderness.  Musculoskeletal: Normal range of motion. He exhibits no edema or tenderness.  Lymphadenopathy:    He has no cervical adenopathy.  Neurological: He is alert and oriented to person, place, and time. He has normal strength. No cranial nerve deficit or sensory deficit.  Skin: Skin is warm, dry and intact. No petechiae and no rash noted. He is not diaphoretic. No  erythema. No pallor.  Psychiatric: He has a normal mood and affect. His behavior is normal. Judgment normal.  Nursing note and vitals reviewed.   ED Course  Procedures   DIAGNOSTIC STUDIES:  Oxygen Saturation is 97% on RA, normal by my interpretation.    COORDINATION OF CARE:  3:40 AM Discussed treatment plan with pt at bedside and pt agreed to plan.  Labs Review Labs Reviewed  BASIC METABOLIC PANEL - Abnormal; Notable for the following:    Glucose, Bld 102 (*)    All other components within normal limits  CBC - Abnormal; Notable for the following:    WBC 11.5 (*)    All other components within normal limits  Rosezena Sensor, ED    Imaging Review Dg Chest 2 View  04/07/2015  CLINICAL DATA:  Left-sided chest pain today. EXAM: CHEST  2 VIEW COMPARISON:  02/18/2015 FINDINGS: The cardiomediastinal contours are normal. The lungs are clear. Pulmonary vasculature is normal. No consolidation, pleural effusion, or pneumothorax. No acute osseous abnormalities are seen. IMPRESSION: No acute pulmonary process. Electronically Signed   By: Rubye Oaks M.D.   On: 04/07/2015 02:54   I have personally reviewed and evaluated these images and lab results as part of my medical decision-making.   EKG Interpretation   Date/Time:  Thursday April 07 2015 02:17:47 EST Ventricular Rate:  103 PR Interval:  132 QRS Duration: 88 QT Interval:  332 QTC Calculation: 434 R Axis:   54 Text Interpretation:  Sinus tachycardia Otherwise normal ECG tachycardia  now present Confirmed by Erroll Luna [phone]) on 04/07/2015 4:32:10  AM      MDM   Final diagnoses:  None   Patient presents to the ED for chest pain.  He took multiple medications for this which likely worsened his symptoms.  These medications were not taken at a toxic level. He was given toradol for pain control.  Education provided on not taking too much medication.  He appears well and in NAD. VS remain within his normal  limits and he is safe for DC with PCP fu within 3 days.     I personally performed the services described in this documentation, which was scribed in my presence. The recorded information has been reviewed and is accurate.      Tomasita Crumble, MD 04/07/15 [phone]

## 2015-10-17 ENCOUNTER — Emergency Department (HOSPITAL_COMMUNITY)
Admission: EM | Admit: 2015-10-17 | Discharge: 2015-10-17 | Disposition: A | Discharge status: Home / Self Care | Payer: Self-pay | Attending: Emergency Medicine | Admitting: Emergency Medicine

## 2015-10-17 ENCOUNTER — Emergency Department (HOSPITAL_COMMUNITY): Payer: Self-pay

## 2015-10-17 DIAGNOSIS — F17210 Nicotine dependence, cigarettes, uncomplicated: Secondary | ICD-10-CM | POA: Insufficient documentation

## 2015-10-17 DIAGNOSIS — F319 Bipolar disorder, unspecified: Secondary | ICD-10-CM | POA: Insufficient documentation

## 2015-10-17 DIAGNOSIS — R600 Localized edema: Secondary | ICD-10-CM | POA: Insufficient documentation

## 2015-10-17 DIAGNOSIS — M79602 Pain in left arm: Secondary | ICD-10-CM

## 2015-10-17 NOTE — ED Triage Notes (Signed)
PT walked out of Ed . Pt did not sign for DC papers.

## 2015-10-17 NOTE — ED Triage Notes (Signed)
Left arm  Pain x 3 years and today states can feel some movement in his bones  When he twist arm, denies injury no redness or swelling

## 2015-10-17 NOTE — ED Notes (Signed)
Declined W/C at D/C and was escorted to lobby by RN. 

## 2015-10-17 NOTE — ED Provider Notes (Signed)
[facility]-EMERGENCY DEPT Provider Note   CSN: [id number] Arrival date & time: 10/17/15  1125     History   Chief Complaint Chief Complaint  Patient presents with  . Arm Pain    HPI Gilbert Meyers is a 30 y.o. male.  Pt comes in with c/o left arm pain for the last 3 years he states that he broke his arm and it hasn't completely healed. He can't say how he hurt it. Denies numbness or weakness      Past Medical History:  Diagnosis Date  . Anxiety   . Bipolar 1 disorder [facility](HCC)     Patient Active Problem List   Diagnosis Date Noted  . 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     Home Medications    Prior to Admission medications   Medication Sig Start Date End Date Taking? Authorizing Provider  esomeprazole (NEXIUM) 20 MG capsule Take 20 mg by mouth daily as needed (acid reflux).    Historical Provider, MD  risperiDONE (RISPERDAL) 3 MG tablet Take 1 tablet (3 mg total) by mouth at bedtime. 03/29/15   Shari ProwsJolanta B Pucilowska, MD  traZODone (DESYREL) 50 MG tablet Take 1 tablet (50 mg total) by mouth at bedtime. 03/29/15   Shari ProwsJolanta B Pucilowska, MD    Family History Family History  Problem Relation Age of Onset  . Colon cancer Neg Hx   . Prostate cancer Paternal Grandfather     Social History Social History  Substance Use Topics  . Smoking status: Current Some Day Smoker    Types: Cigarettes  . Smokeless tobacco: Never Used  . Alcohol use No     Comment: Was a heavy drinker, quit 4 months ago      Allergies   Review of patient's allergies indicates no known allergies.   Review of Systems Review of Systems  All other systems reviewed and are negative.    Physical Exam Updated Vital Signs BP 145/85 (BP Location: Right Arm)   Pulse 95   Temp 97.8 F (36.6 C) (Oral)   Resp 18   Ht 5\' 10"  (1.778 m)   Wt 105.5 kg   SpO2 97%   BMI 33.37 kg/m   Physical Exam  Constitutional: He appears well-developed and  well-nourished.  Cardiovascular: Normal rate.   Pulmonary/Chest: Effort normal.  Abdominal: Soft.  Musculoskeletal: Normal range of motion. He exhibits edema, tenderness and deformity.  Neurological: He is alert.  Skin: Skin is warm.  Psychiatric: His speech is rapid and/or pressured.  Vitals reviewed.    ED Treatments / Results  Labs (all labs ordered are listed, but only abnormal results are displayed) Labs Reviewed - No data to display  EKG  EKG Interpretation None       Radiology No results found.  Procedures Procedures (including critical care time)  Medications Ordered in ED Medications - No data to display   Initial Impression / Assessment and Plan / ED Course  I have reviewed the triage vital signs and the nursing notes.  Pertinent labs & imaging results that were available during my care of the patient were reviewed by me and considered in my medical decision making (see chart for details).  Clinical Course    No acute abnormality noted. Pt told to follow up with ortho.neurovascularly intact  Final Clinical Impressions(s) / ED Diagnoses   Final diagnoses:  None    New Prescriptions New Prescriptions   No medications on file  Teressa LowerVrinda Amarya Kuehl, NP 10/17/15 1432    Raeford RazorStephen Kohut, MD 10/22/15 [phone]

## 2017-01-24 IMAGING — CR DG ABDOMEN ACUTE W/ 1V CHEST
3 series · 3 of 3 positions shown · non-contrast
Comparison: CT abdomen and pelvis October 06, 2010

CLINICAL DATA: Abdominal pain

EXAM:
DG ABDOMEN ACUTE W/ 1V CHEST

[chest pa]
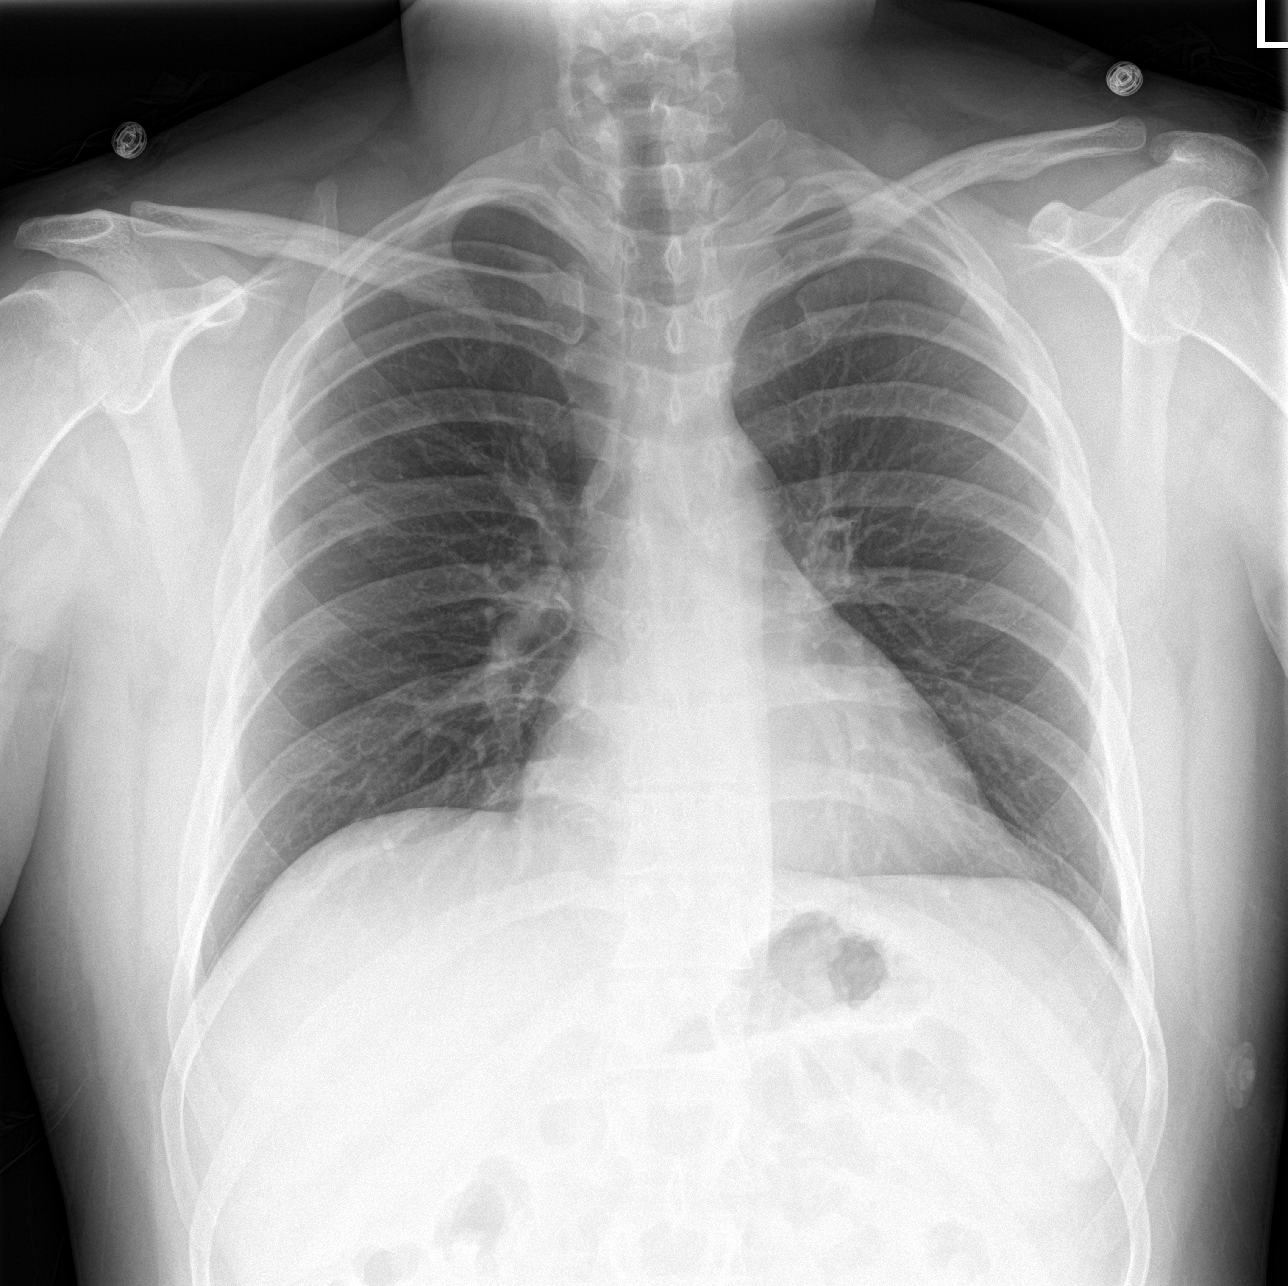

[abdomen erect]
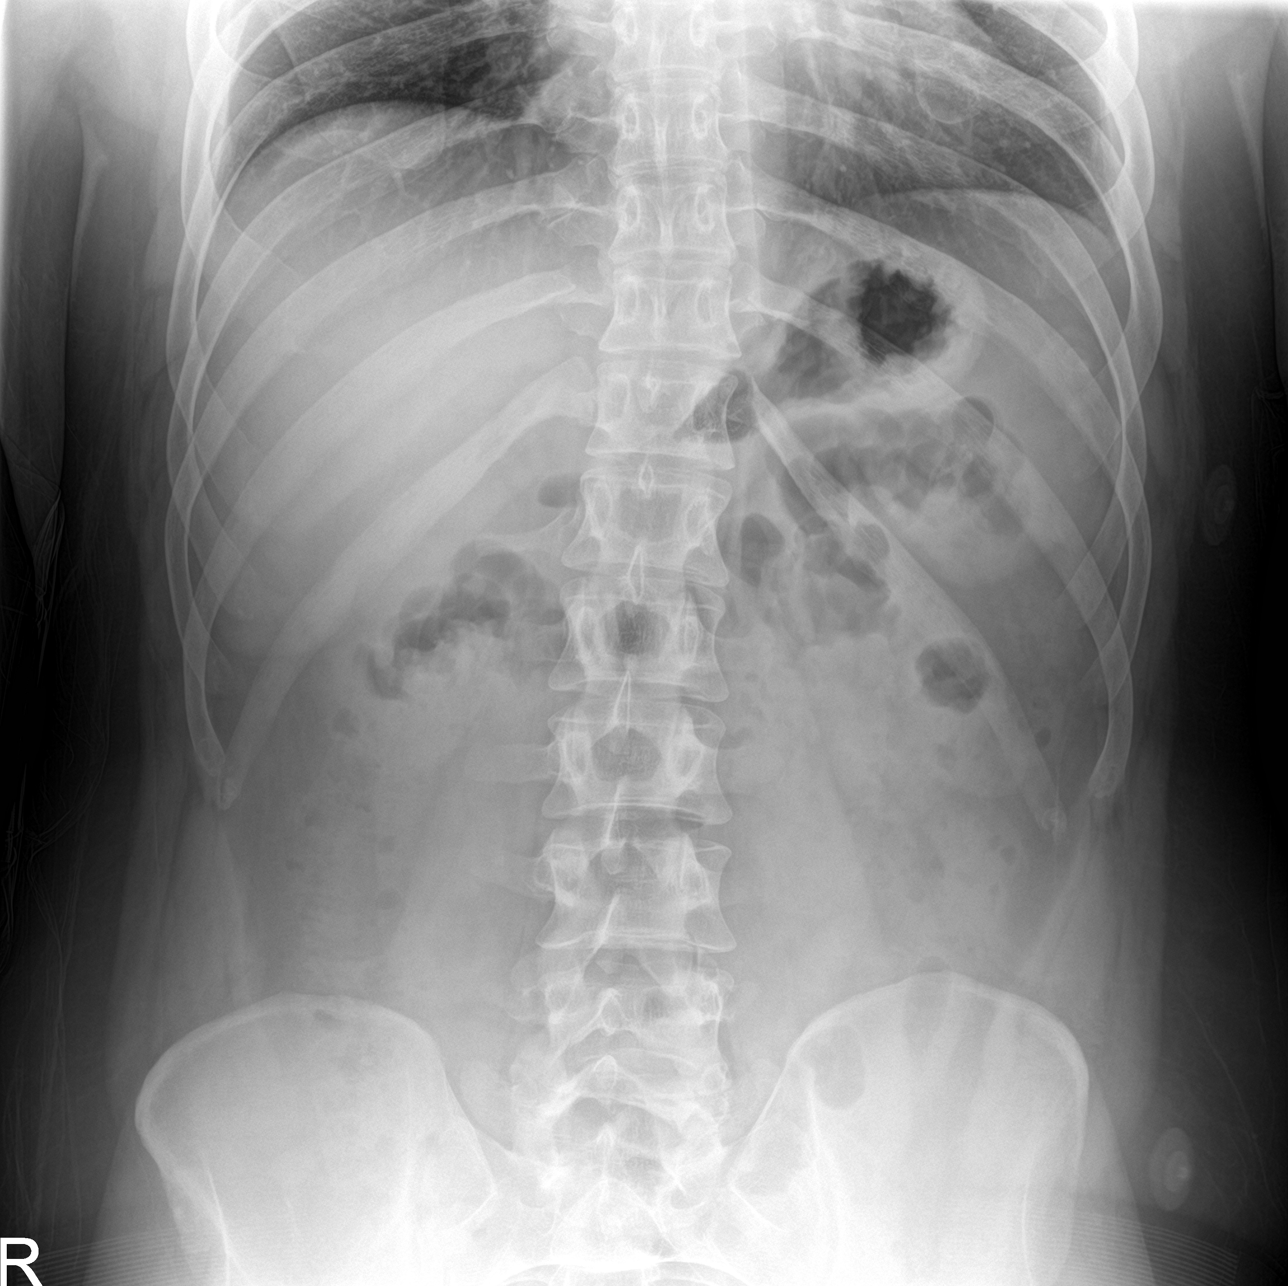

[abdomen supine]
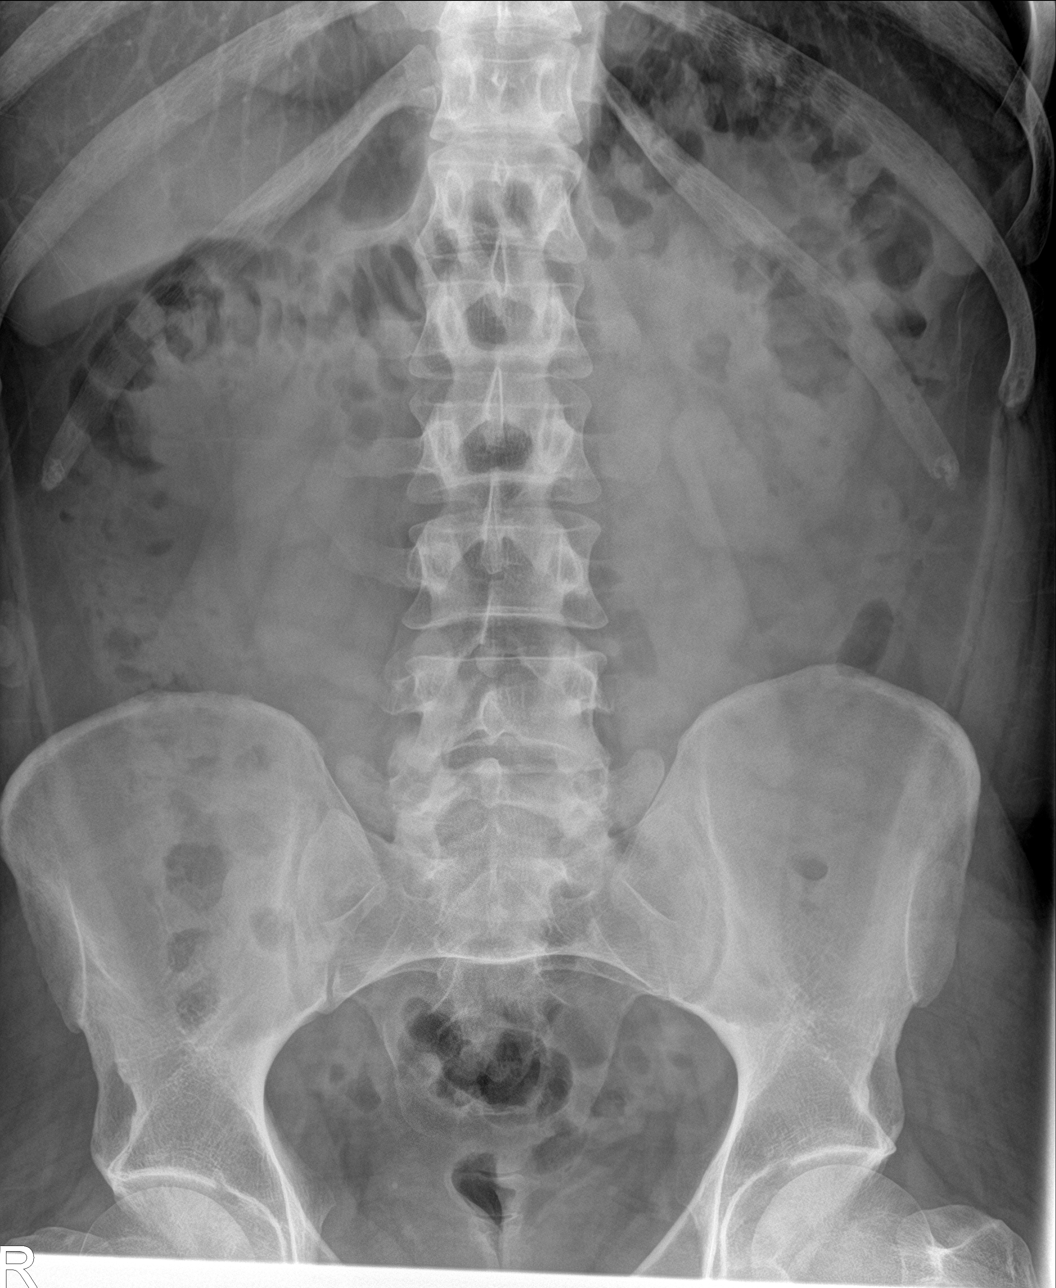

[3 of 3 positions shown; findings below may reference images not displayed]

FINDINGS: PA chest: Lungs are clear. Heart size and pulmonary vascularity are
normal. No adenopathy.

Supine and upright abdomen: There is moderate stool in the colon.
There is no bowel dilatation or air-fluid level suggesting
obstruction. No free air. There are small phleboliths in the pelvis.
IMPRESSION: Bowel gas pattern unremarkable. No obstruction or free air. Lungs
clear.

## 2017-02-18 ENCOUNTER — Emergency Department (HOSPITAL_COMMUNITY)
Admission: EM | Admit: 2017-02-18 | Discharge: 2017-02-19 | Disposition: A | Discharge status: Home / Self Care | Payer: Medicaid Other | Attending: Emergency Medicine | Admitting: Emergency Medicine

## 2017-02-18 ENCOUNTER — Encounter (HOSPITAL_COMMUNITY): Payer: Self-pay | Admitting: Emergency Medicine

## 2017-02-18 DIAGNOSIS — Z79899 Other long term (current) drug therapy: Secondary | ICD-10-CM | POA: Diagnosis not present

## 2017-02-18 DIAGNOSIS — M25512 Pain in left shoulder: Secondary | ICD-10-CM | POA: Diagnosis not present

## 2017-02-18 DIAGNOSIS — G8929 Other chronic pain: Secondary | ICD-10-CM

## 2017-02-18 DIAGNOSIS — F17210 Nicotine dependence, cigarettes, uncomplicated: Secondary | ICD-10-CM | POA: Insufficient documentation

## 2017-02-18 NOTE — ED Triage Notes (Signed)
Pt to ED from home c/o L upper arm pain x years - states he injured it 4 years ago but unable to further explain why. Patient states "I don't know why it's taken me this long to have it checked out, but lately I feel like I'm going to have a seizure it hurts so bad." Pt has full ROM, states he can make it pop whenever he wants, which makes it hurt more. Pt denies numbness/tingling.

## 2017-02-19 ENCOUNTER — Encounter: Payer: Self-pay | Admitting: Family Medicine

## 2017-02-19 ENCOUNTER — Ambulatory Visit: Payer: Medicaid Other | Attending: Family Medicine | Admitting: Family Medicine

## 2017-02-19 ENCOUNTER — Ambulatory Visit: Payer: Self-pay | Attending: Family Medicine | Admitting: Licensed Clinical Social Worker

## 2017-02-19 VITALS — BP 129/82 | HR 89 | Temp 97.7°F | Ht 69.0 in | Wt 242.8 lb

## 2017-02-19 DIAGNOSIS — F203 Undifferentiated schizophrenia: Secondary | ICD-10-CM

## 2017-02-19 DIAGNOSIS — G8929 Other chronic pain: Secondary | ICD-10-CM | POA: Insufficient documentation

## 2017-02-19 DIAGNOSIS — I10 Essential (primary) hypertension: Secondary | ICD-10-CM | POA: Insufficient documentation

## 2017-02-19 DIAGNOSIS — M25512 Pain in left shoulder: Secondary | ICD-10-CM | POA: Insufficient documentation

## 2017-02-19 MED ORDER — LISINOPRIL 5 MG PO TABS: 5.0000 mg | ORAL_TABLET | Freq: Every day | ORAL | 3 refills | Status: DC

## 2017-02-19 NOTE — Progress Notes (Signed)
Subjective:  Patient ID: Gilbert Meyers, male    DOB: [birth year]  Age: 31 y.o. MRN: [id number]  CC: Arm Pain   HPI Gilbert Meyers is a 31 year old male with a history of schizoaffective disorder currently followed by psychiatry at [facility], Gilbert Meyers who presents today to establish care.  He complains of left shoulder pain which he states he has had for the last 4 years after  he hurt his left arm.  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 He described the pain as "so bad, I feel I could die". Pain is sometimes at a 10/10 but is 6/10 at this time and has gotten better over the years. Pain makes him nauseous and he takes Tylenol intermittently with no relief of symptoms.  He would like something to be done about his arm and he dislocates his arm and then reduces it on his own and he informs me "see, there issomething wrong with it"  Blood pressure is elevated and he has no underlying history of hypertension.  Past Medical History:  Diagnosis Date  . Anxiety   . Bipolar 1 disorder ([place])     No past surgical history on file.  No Known Allergies   Outpatient Medications Prior to Visit  Medication Sig Dispense Refill  . esomeprazole (NEXIUM) 20 MG capsule Take 20 mg by mouth daily as needed (acid reflux).    Marland Kitchen thiothixene (NAVANE) 2 MG capsule Take one (1) capsule by mouth at bedtime    . risperiDONE (RISPERDAL) 3 MG tablet Take 1 tablet (3 mg total) by mouth at bedtime. (Patient not taking: Reported on 02/19/2017) 30 tablet 0  . traZODone (DESYREL) 50 MG tablet Take 1 tablet (50 mg total) by mouth at bedtime. (Patient not taking: Reported on 02/19/2017) 30 tablet 0   No facility-administered medications prior to visit.     ROS Review of Systems  Constitutional: Negative for activity change and appetite change.  HENT: Negative for sinus pressure and sore throat.   Eyes: Negative for visual disturbance.    Respiratory: Negative for cough, chest tightness and shortness of breath.   Cardiovascular: Negative for chest pain and leg swelling.  Gastrointestinal: Negative for abdominal distention, abdominal pain, constipation and diarrhea.  Endocrine: Negative.   Genitourinary: Negative for dysuria.  Musculoskeletal:       See HPI  Skin: Negative for rash.  Allergic/Immunologic: Negative.   Neurological: Negative for weakness, light-headedness and numbness.  Psychiatric/Behavioral: Negative for dysphoric mood and suicidal ideas.    Objective:  BP 129/82   Pulse 89   Temp 97.7 F (36.5 C) (Oral)   Ht '5\' 9"'  (1.753 m)   Wt 242 lb 12.8 oz (110.1 kg)   SpO2 97%   BMI 35.86 kg/m   BP/Weight 02/19/2017 02/18/2017 9/35/7017  Systolic BP 793 903 009  Diastolic BP 82 97 85  Wt. (Lbs) 242.8 241 232.6  BMI 35.86 35.08 33.37  Some encounter information is confidential and restricted. Go to Review Flowsheets activity to see all data.      Physical Exam  Constitutional: He is oriented to person, place, and time. He appears well-developed and well-nourished.  Cardiovascular: Normal rate, normal heart sounds and intact distal pulses.  No murmur heard. Pulmonary/Chest: Effort normal and breath sounds normal. He has no wheezes. He has no rales. He exhibits no tenderness.  Abdominal: Soft. Bowel sounds are normal. He exhibits no distension and no  mass. There is no tenderness.  Musculoskeletal: Normal range of motion.  Neurological: He is alert and oriented to person, place, and time.  Psychiatric: Thought content is delusional.     EXAM: LEFT HUMERUS - 2+ VIEW  COMPARISON:  None.  FINDINGS: Frontal and lateral views were obtained. There is no demonstrable fracture or dislocation. The joint spaces appear within normal limits. No erosive change.  IMPRESSION: No fracture or dislocation.  No apparent arthropathy.   Electronically Signed   By: Lowella Grip III M.D.   On:  10/17/2015 14:20  Assessment & Plan:   1. Undifferentiated schizophrenia [facility]) He is currently followed by Luberta Mutter LCSW called in for counseling and we will have him sign a release of information  2. Essential hypertension Uncontrolled We will initiate lisinopril - CMP14+EGFR - lisinopril (PRINIVIL,ZESTRIL) 5 MG tablet; Take 1 tablet (5 mg total) by mouth daily.  Dispense: 30 tablet; Refill: 3  3. Chronic left shoulder pain Ongoing for the last 4 years according to the patient He provides a history which is suspicious for somatization and to have explained this to him Does not seem to be in pain on my exam  Last x-ray unrevealing He is insisting on something being done for his shoulder and I will refer him to sports medicine if he so desires   Meds ordered this encounter  Medications  . lisinopril (PRINIVIL,ZESTRIL) 5 MG tablet    Sig: Take 1 tablet (5 mg total) by mouth daily.    Dispense:  30 tablet    Refill:  3    Follow-up: One month follow-up on hypertension and shoulder pain  Arnoldo Morale MD

## 2017-02-19 NOTE — ED Provider Notes (Signed)
[facility] Provider Note   CSN: [id number] Arrival date & time: 02/18/17  2021     History   Chief Complaint Chief Complaint  Patient presents with  . Arm Pain    HPI Gilbert Meyers is a 31 y.o. male.  HPI Patient is a 31 year old male presents emergency department with years of left shoulder pain and left shoulder popping.  He has no new complaints today he was just hoping for this to be evaluated because he does not have insurance.  He was seen by medical provider approximately year and a half ago and had an x-ray and was told that everything was normal.  He was given the contact information for follow-up but never made any follow-up.  No other complaints.  No numbness or weakness.     Past Medical History:  Diagnosis Date  . Anxiety   . Bipolar 1 disorder [facility](HCC)     Patient Active Problem List   Diagnosis Date Noted  . 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 No pertinent surgical history.     Home Medications    Prior to Admission medications   Medication Sig Start Date End Date Taking? Authorizing Provider  esomeprazole (NEXIUM) 20 MG capsule Take 20 mg by mouth daily as needed (acid reflux).    [provider]  risperiDONE (RISPERDAL) 3 MG tablet Take 1 tablet (3 mg total) by mouth at bedtime. 03/29/15   Pucilowska, Braulio ConteJolanta B, MD  traZODone (DESYREL) 50 MG tablet Take 1 tablet (50 mg total) by mouth at bedtime. 03/29/15   Shari ProwsPucilowska, Jolanta B, MD    Family History Family History  Problem Relation Age of Onset  . Prostate cancer Paternal Grandfather   . Colon cancer Neg Hx     Social History Social History   Tobacco Use  . Smoking status: Current Some Day Smoker    Packs/day: 0.10    Types: Cigarettes  . Smokeless tobacco: Never Used  Substance Use Topics  . Alcohol use: No    Comment: Was a heavy drinker, quit 4 months ago   . Drug use: No     Allergies   Patient  has no known allergies.   Review of Systems Review of Systems  All other systems reviewed and are negative.    Physical Exam Updated Vital Signs BP (!) 154/97 (BP Location: Right Arm)   Pulse (!) 109   Temp 98.3 F (36.8 C) (Oral)   Resp 18   Ht 5' 9.5" (1.765 m) Comment: Simultaneous filing. User may not have seen previous data.  Wt 109.3 kg (241 lb) Comment: Simultaneous filing. User may not have seen previous data.  SpO2 96%   BMI 35.08 kg/m   Physical Exam  Constitutional: He is oriented to person, place, and time. He appears well-developed and well-nourished.  HENT:  Head: Normocephalic.  Eyes: EOM are normal.  Neck: Normal range of motion.  Pulmonary/Chest: Effort normal.  Abdominal: He exhibits no distension.  Musculoskeletal: Normal range of motion.  Able to fully range his left shoulder.  No swelling of his left upper extremity as compared to his right.  Normal left radial pulse  Neurological: He is alert and oriented to person, place, and time.  Psychiatric: He has a normal mood and affect.  Nursing note and vitals reviewed.    ED Treatments / Results  Labs (all labs ordered are listed, but only abnormal results are displayed) Labs Reviewed - No data to display  EKG  EKG Interpretation None       Radiology No results found.  Procedures Procedures (including critical care time)  Medications Ordered in ED Medications - No data to display   Initial Impression / Assessment and Plan / ED Course  I have reviewed the triage vital signs and the nursing notes.  Pertinent labs & imaging results that were available during my care of the patient were reviewed by me and considered in my medical decision making (see chart for details).     Patient will benefit from sports medicine evaluation.  He will be unable to follow-up with sports medicine without insurance and therefore I [facility]  Final Clinical  Impressions(s) / ED Diagnoses   Final diagnoses:  Chronic left shoulder pain    ED Discharge Orders    None       Azalia Bilisampos, Lenetta Piche, MD 02/19/17 [phone]

## 2017-02-19 NOTE — Patient Instructions (Signed)

## 2017-02-19 NOTE — BH Specialist Note (Addendum)
Integrated Behavioral Health Initial Visit  MRN: [id number] Name: Gilbert GrahamJeremy Stadler  Number of Integrated Behavioral Health Clinician visits:: 1/6 Session Start time: 10:30 AM   Session End time: 11:00 AM Total time: 30 minutes  Type of Service: Integrated Behavioral Health- Individual/Family Interpretor:No. Interpretor Name and Language: N.A   Warm Hand Off Completed.       SUBJECTIVE: Gilbert GrahamJeremy Campau is a 31 y.o. male accompanied by self Patient was referred by Dr. Venetia NightAmao for chronic pain. Patient reports the following symptoms/concerns: Pt reports chronic pain in left shoulder for four years, states "I heard some bad news and my arm just broke" Duration of problem: four years; Pt reports diagnosis of Schizophrenia Severity of problem: moderate  OBJECTIVE: Mood: Anxious and Affect: Appropriate Risk of harm to self or others: No plan to harm self or others  LIFE CONTEXT: Family and Social: Pt resides with both of his parents. He reports a strained relationship with father due to his disbelief of pt's constant arm pain School/Work: Pt has a degree in sports management; however, reports inability to work due to chronic pain. He does not receive any public benefits Self-Care: Pt smokes "a few" cigarettes a month.  Life Changes: Pt reports experiencing chronic pain that prevents him from working. He reports anxiety from creditors calling him about "massive" student loan debt  GOALS ADDRESSED: Patient will: 1. Reduce symptoms of: anxiety 2. Increase knowledge and/or ability of: coping skills and healthy habits  3. Demonstrate ability to: Increase healthy adjustment to current life circumstances  INTERVENTIONS: Interventions utilized: Solution-Focused Strategies, Sleep Hygiene, Psychoeducation and/or Health Education and Link to [facility]  Standardized Assessments completed: GAD-7 and PHQ 2&9  ASSESSMENT: Patient currently experiencing anxiety triggered by chronic pain in  left shoulder for four years, stating "I heard some bad news and my arm just broke". He reports anxiety from creditors calling him about "massiveBuilding surveyor" student loan debt. He experiences irritability, racing thoughts, and decreased sleep. Denied SI/HI. He receives financial support from his parents.   Patient may benefit from psychoeducation and psychotherapy. LCSWA educated pt on correlation between physical and mental health, in addition, to how stress can negatively impact his health. Pt successfully identified healthy coping skills to decrease symptoms. LCSWA discussed strategies to improve sleep hygiene and cope with chronic pain. Pt is participating in medication management with [facility] Pt signed Release of Information.   PLAN: 1. Follow up with behavioral health clinician on : Pt was encouraged to contact LCSWA if symptoms worsen or fail to improve to schedule [facility] appointments at [facility]. 2. Behavioral recommendations: LCSWA recommends that pt apply healthy coping skills discussed, continue to comply with medication management, and utilize provided resources. Pt is encouraged to schedule follow up appointment with LCSWA 3. Referral(s): NA 4. "From scale of 1-10, how likely are you to follow plan?": 10/10  Bridgett LarssonJasmine D Khush Pasion, LCSW 02/21/17 9:36 AM

## 2017-02-20 LAB — CMP14+EGFR
ALBUMIN: 4.8 g/dL (ref 3.5–5.5)
ALK PHOS: 117 IU/L (ref 39–117)
ALT: 57 IU/L — ABNORMAL HIGH (ref 0–44)
AST: 33 IU/L (ref 0–40)
Albumin/Globulin Ratio: 1.9 (ref 1.2–2.2)
BILIRUBIN TOTAL: 1.5 mg/dL — AB (ref 0.0–1.2)
BUN / CREAT RATIO: 14 (ref 9–20)
BUN: 14 mg/dL (ref 6–20)
CHLORIDE: 103 mmol/L (ref 96–106)
CO2: 24 mmol/L (ref 20–29)
Calcium: 9.6 mg/dL (ref 8.7–10.2)
Creatinine, Ser: 0.97 mg/dL (ref 0.76–1.27)
GFR calc non Af Amer: 104 mL/min/{1.73_m2} (ref >59)
GFR, EST AFRICAN AMERICAN: 120 mL/min/{1.73_m2} (ref >59)
GLOBULIN, TOTAL: 2.5 g/dL (ref 1.5–4.5)
Glucose: 94 mg/dL (ref 65–99)
Potassium: 4.6 mmol/L (ref 3.5–5.2)
SODIUM: 141 mmol/L (ref 134–144)
Total Protein: 7.3 g/dL (ref 6.0–8.5)

## 2017-02-21 ENCOUNTER — Telehealth: Payer: Self-pay

## 2017-02-21 NOTE — Telephone Encounter (Signed)
Pt was called and a VM was left informing pt to return phone call for lab results. 

## 2017-02-21 NOTE — Telephone Encounter (Signed)
Pt returned phone call and was informed of lab results. 

## 2017-05-09 ENCOUNTER — Emergency Department (HOSPITAL_COMMUNITY): Payer: Medicaid Other

## 2017-05-09 ENCOUNTER — Emergency Department (HOSPITAL_COMMUNITY)
Admission: EM | Admit: 2017-05-09 | Discharge: 2017-05-10 | Disposition: A | Discharge status: Home / Self Care | Payer: Medicaid Other | Attending: Emergency Medicine | Admitting: Emergency Medicine

## 2017-05-09 ENCOUNTER — Encounter (HOSPITAL_COMMUNITY): Payer: Self-pay

## 2017-05-09 DIAGNOSIS — M79602 Pain in left arm: Secondary | ICD-10-CM

## 2017-05-09 DIAGNOSIS — Z79899 Other long term (current) drug therapy: Secondary | ICD-10-CM | POA: Insufficient documentation

## 2017-05-09 DIAGNOSIS — F319 Bipolar disorder, unspecified: Secondary | ICD-10-CM | POA: Diagnosis not present

## 2017-05-09 DIAGNOSIS — F17210 Nicotine dependence, cigarettes, uncomplicated: Secondary | ICD-10-CM | POA: Diagnosis not present

## 2017-05-09 DIAGNOSIS — R4689 Other symptoms and signs involving appearance and behavior: Secondary | ICD-10-CM

## 2017-05-09 LAB — CBC WITH DIFFERENTIAL/PLATELET
BASOS ABS: 0 10*3/uL (ref 0.0–0.1)
Basophils Relative: 1 %
EOS PCT: 1 %
Eosinophils Absolute: 0.1 10*3/uL (ref 0.0–0.7)
HCT: 47.3 % (ref 39.0–52.0)
Hemoglobin: 15.9 g/dL (ref 13.0–17.0)
LYMPHS PCT: 22 %
Lymphs Abs: 1.9 10*3/uL (ref 0.7–4.0)
MCH: 29.8 pg (ref 26.0–34.0)
MCHC: 33.6 g/dL (ref 30.0–36.0)
MCV: 88.7 fL (ref 78.0–100.0)
Monocytes Absolute: 0.6 10*3/uL (ref 0.1–1.0)
Monocytes Relative: 7 %
Neutro Abs: 5.9 10*3/uL (ref 1.7–7.7)
Neutrophils Relative %: 69 %
PLATELETS: 264 10*3/uL (ref 150–400)
RBC: 5.33 MIL/uL (ref 4.22–5.81)
RDW: 12.9 % (ref 11.5–15.5)
WBC: 8.4 10*3/uL (ref 4.0–10.5)

## 2017-05-09 LAB — BASIC METABOLIC PANEL
ANION GAP: 17 — AB (ref 5–15)
BUN: 14 mg/dL (ref 6–20)
CO2: 18 mmol/L — ABNORMAL LOW (ref 22–32)
Calcium: 9.5 mg/dL (ref 8.9–10.3)
Chloride: 100 mmol/L — ABNORMAL LOW (ref 101–111)
Creatinine, Ser: 1.09 mg/dL (ref 0.61–1.24)
GLUCOSE: 94 mg/dL (ref 65–99)
POTASSIUM: 3.9 mmol/L (ref 3.5–5.1)
Sodium: 135 mmol/L (ref 135–145)

## 2017-05-09 LAB — RAPID URINE DRUG SCREEN, HOSP PERFORMED
Amphetamines: NOT DETECTED
BARBITURATES: POSITIVE — AB
BENZODIAZEPINES: NOT DETECTED
COCAINE: NOT DETECTED
OPIATES: NOT DETECTED
Tetrahydrocannabinol: NOT DETECTED

## 2017-05-09 LAB — RAPID STREP SCREEN (MED CTR MEBANE ONLY): Streptococcus, Group A Screen (Direct): NEGATIVE

## 2017-05-09 LAB — ETHANOL: Alcohol, Ethyl (B): <10 mg/dL (ref <10)

## 2017-05-09 MED ORDER — LORAZEPAM 1 MG PO TABS
1.0000 mg | ORAL_TABLET | Freq: Once | ORAL | Status: AC
  Administered 2017-05-09: 1 mg via ORAL
  Filled 2017-05-09: qty 1

## 2017-05-09 NOTE — ED Notes (Signed)
Pt c/o sore throat

## 2017-05-09 NOTE — BH Assessment (Signed)
05/09/2017: Patient meets criteria for INPT treatment per Barbara CowerJason. [facility] to [facility], [place], [facility], [place], [place], [place] [facility], [place], [facility], and [facility]. Pending review.
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
## 2017-05-09 NOTE — ED Triage Notes (Addendum)
Ems reports pt has been off of his risperdone x 4 days.  Pt says he is sob and can't swallow.  Pt alert and oriented.  Very anxious, restless.   Reports left arm pain for years.    EMS says pt was crushing tylenol trying to take for pain.  Pt denies si and hi.

## 2017-05-09 NOTE — ED Provider Notes (Signed)
[facility] EMERGENCY DEPARTMENT Provider Note   CSN: [id number] Arrival date & time: 05/09/17  1138     History   Chief Complaint Chief Complaint  Patient presents with  . Anxiety  . Arm Pain    HPI Gilbert Meyers is a 32 y.o. male.  Level 5 caveat for psychiatric disorder.  History obtained from patient and his parents.  The patient's chief complaint is a clicking sensation in his left upper arm which has bothered him for several years.  His mother and father have a different perspective.  They feel he has mental health problems and has been noncompliant with his medications.  He has had several admissions to behavioral health centers.  He is unable to work secondary to his arm pain, but he is able to play video games.  He gets anxious when riding in the car.  Patient describes difficulty swallowing.  No suicidal or homicidal ideation.  Past medical history documented as "anxiety, bipolar disorder, undifferentiated schizophrenia".      Past Medical History:  Diagnosis Date  . Anxiety   . Bipolar 1 disorder [facility])     Patient Active Problem List   Diagnosis Date Noted  . Hypertension 02/19/2017  . 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 No pertinent surgical history.     Home Medications    Prior to Admission medications   Medication Sig Start Date End Date Taking? Authorizing Provider  acetaminophen (TYLENOL) 325 MG tablet Take 650 mg by mouth every 6 (six) hours as needed.   Yes [provider]  risperiDONE (RISPERDAL) 0.25 MG tablet Take 1 tablet by mouth daily.  03/28/17  Yes [provider]  lisinopril (PRINIVIL,ZESTRIL) 5 MG tablet Take 1 tablet (5 mg total) by mouth daily. 02/19/17   Hoy Register, MD  traZODone (DESYREL) 50 MG tablet Take 1 tablet (50 mg total) by mouth at bedtime. 03/29/15   Shari Prows, MD    Family History Family History  Problem Relation Age of Onset  . Prostate cancer Paternal  Grandfather   . Colon cancer Neg Hx     Social History Social History   Tobacco Use  . Smoking status: Current Some Day Smoker    Packs/day: 0.10    Types: Cigarettes  . Smokeless tobacco: Never Used  Substance Use Topics  . Alcohol use: No    Comment: former  . Drug use: No     Allergies   Patient has no known allergies.   Review of Systems Review of Systems  Unable to perform ROS: Psychiatric disorder     Physical Exam Updated Vital Signs BP 137/85 (BP Location: Right Arm)   Pulse (!) 109   Temp 97.6 F (36.4 C) (Oral)   Resp 18   Ht 5\' 10"  (1.778 m)   Wt 109.8 kg (242 lb)   SpO2 95%   BMI 34.72 kg/m   Physical Exam  Constitutional: He is oriented to person, place, and time. He appears well-developed and well-nourished.  HENT:  Head: Normocephalic and atraumatic.  Normal airway  Eyes: Conjunctivae are normal.  Neck: Neck supple.  Cardiovascular: Normal rate and regular rhythm.  Pulmonary/Chest: Effort normal and breath sounds normal.  Abdominal: Soft. Bowel sounds are normal.  Musculoskeletal:  Left upper extremity: Clicking and snapping sensation noted in mid humerus with flexion at the elbow.  Neurological: He is alert and oriented to person, place, and time.  Skin: Skin is warm and dry.  Psychiatric: He has a  normal mood and affect. His behavior is normal.  Nursing note and vitals reviewed.    ED Treatments / Results  Labs (all labs ordered are listed, but only abnormal results are displayed) Labs Reviewed  BASIC METABOLIC PANEL - Abnormal; Notable for the following components:      Result Value   Chloride 100 (*)    CO2 18 (*)    Anion gap 17 (*)    All other components within normal limits  RAPID URINE DRUG SCREEN, HOSP PERFORMED - Abnormal; Notable for the following components:   Barbiturates POSITIVE (*)    All other components within normal limits  RAPID STREP SCREEN (NOT AT [facility])  CULTURE, GROUP A STREP ([facility])  CBC WITH  DIFFERENTIAL/PLATELET  ETHANOL    EKG  EKG Interpretation None       Radiology Dg Humerus Left  Result Date: 05/09/2017 CLINICAL DATA:  Left humeral midshaft pain. EXAM: LEFT HUMERUS - 2+ VIEW COMPARISON:  None. FINDINGS: There is no evidence of fracture or other focal bone lesions. Soft tissues are unremarkable. IMPRESSION: Negative. Electronically Signed   By: Charlett NoseKevin  Dover M.D.   On: 05/09/2017 13:24    Procedures Procedures (including critical care time)  Medications Ordered in ED Medications  LORazepam (ATIVAN) tablet 1 mg (1 mg Oral Given 05/09/17 1157)     Initial Impression / Assessment and Plan / ED Course  I have reviewed the triage vital signs and the nursing notes.  Pertinent labs & imaging results that were available during my care of the patient were reviewed by me and considered in my medical decision making (see chart for details).     Patient presents with a concerned about his left arm.  However, parents think his behavior is abnormal.  He has been partially compliant with psychiatric medicines.  He has been admitted to psychiatric facilities in the past.  No obvious suicidal or homicidal ideation.  Will consult behavioral health.  Ultrasound of left upper extremity pending.  Discussed with Dr. Ranae PalmsYelverton  Final Clinical Impressions(s) / ED Diagnoses   Final diagnoses:  Behavioral change  Left arm pain    ED Discharge Orders    None       Donnetta Hutchingook, Karyn Brull, MD 05/09/17 [phone]

## 2017-05-10 NOTE — ED Notes (Signed)
Pt given brochure from [employer].

## 2017-05-10 NOTE — ED Notes (Signed)
Sp[facility] with Gilbert Shoreseidre  [facility].  Pt is cleared to go home and follow-up with [facility].  Dr Juleen ChinaKohut informed.

## 2017-05-10 NOTE — [facility] Counselor (Signed)
This Probation officer and Jinny Blossom, NP met with pt for reassessment. Pt continues focus on physical pain of his left arm. He denies SI, HI, & AVH.  Pt reports he is safe to return home, where he lives with his parents. Jinny Blossom, NP recommends discharge from hospital. Pt to follow up tx [facility] at [facility], where he is an established pt.

## 2017-05-12 LAB — CULTURE, GROUP A STREP (THRC)

## 2017-05-21 ENCOUNTER — Ambulatory Visit: Payer: Self-pay | Admitting: Family Medicine

## 2017-05-31 ENCOUNTER — Encounter (HOSPITAL_COMMUNITY): Payer: Self-pay | Admitting: Emergency Medicine

## 2017-05-31 ENCOUNTER — Other Ambulatory Visit: Payer: Self-pay

## 2017-05-31 ENCOUNTER — Emergency Department (HOSPITAL_COMMUNITY)
Admission: EM | Admit: 2017-05-31 | Discharge: 2017-06-03 | Disposition: A | Discharge status: Other Acute Inpatient Hospital | Payer: Medicaid Other | Attending: Emergency Medicine | Admitting: Emergency Medicine

## 2017-05-31 DIAGNOSIS — I10 Essential (primary) hypertension: Secondary | ICD-10-CM | POA: Diagnosis not present

## 2017-05-31 DIAGNOSIS — R1319 Other dysphagia: Secondary | ICD-10-CM | POA: Insufficient documentation

## 2017-05-31 DIAGNOSIS — F419 Anxiety disorder, unspecified: Secondary | ICD-10-CM | POA: Insufficient documentation

## 2017-05-31 DIAGNOSIS — F319 Bipolar disorder, unspecified: Secondary | ICD-10-CM | POA: Insufficient documentation

## 2017-05-31 DIAGNOSIS — Z79899 Other long term (current) drug therapy: Secondary | ICD-10-CM | POA: Insufficient documentation

## 2017-05-31 DIAGNOSIS — Z046 Encounter for general psychiatric examination, requested by authority: Secondary | ICD-10-CM | POA: Insufficient documentation

## 2017-05-31 DIAGNOSIS — F17210 Nicotine dependence, cigarettes, uncomplicated: Secondary | ICD-10-CM | POA: Diagnosis not present

## 2017-05-31 HISTORY — DX: Schizophrenia, unspecified: F20.9

## 2017-05-31 NOTE — ED Notes (Signed)
Call with updates  Lupita LeashDonna: [phone] Aurther Lofterry: [phone]

## 2017-05-31 NOTE — ED Triage Notes (Signed)
Pt c/o not able to swallow solid food x 5 weeks, he states he can swallow liquids. [facility] sent pt over to be committed. Pt denies any si/hi ideations.

## 2017-06-01 ENCOUNTER — Emergency Department (HOSPITAL_COMMUNITY): Payer: Medicaid Other

## 2017-06-01 LAB — RAPID URINE DRUG SCREEN, HOSP PERFORMED
Amphetamines: NOT DETECTED
BARBITURATES: POSITIVE — AB
Benzodiazepines: NOT DETECTED
COCAINE: NOT DETECTED
Opiates: NOT DETECTED
TETRAHYDROCANNABINOL: NOT DETECTED

## 2017-06-01 LAB — COMPREHENSIVE METABOLIC PANEL
ALBUMIN: 4.3 g/dL (ref 3.5–5.0)
ALK PHOS: 71 U/L (ref 38–126)
ALT: 30 U/L (ref 17–63)
AST: 19 U/L (ref 15–41)
Anion gap: 15 (ref 5–15)
BILIRUBIN TOTAL: 2.2 mg/dL — AB (ref 0.3–1.2)
BUN: 9 mg/dL (ref 6–20)
CALCIUM: 8.8 mg/dL — AB (ref 8.9–10.3)
CO2: 23 mmol/L (ref 22–32)
Chloride: 105 mmol/L (ref 101–111)
Creatinine, Ser: 0.75 mg/dL (ref 0.61–1.24)
GFR calc Af Amer: >60 mL/min (ref >60)
GFR calc non Af Amer: >60 mL/min (ref >60)
GLUCOSE: 74 mg/dL (ref 65–99)
Potassium: 3.6 mmol/L (ref 3.5–5.1)
Sodium: 143 mmol/L (ref 135–145)
TOTAL PROTEIN: 6.6 g/dL (ref 6.5–8.1)

## 2017-06-01 LAB — CBC WITH DIFFERENTIAL/PLATELET
BASOS ABS: 0 10*3/uL (ref 0.0–0.1)
BASOS PCT: 1 %
EOS PCT: 1 %
Eosinophils Absolute: 0.1 10*3/uL (ref 0.0–0.7)
HCT: 42 % (ref 39.0–52.0)
Hemoglobin: 14.1 g/dL (ref 13.0–17.0)
LYMPHS PCT: 33 %
Lymphs Abs: 2.1 10*3/uL (ref 0.7–4.0)
MCH: 29.7 pg (ref 26.0–34.0)
MCHC: 33.6 g/dL (ref 30.0–36.0)
MCV: 88.6 fL (ref 78.0–100.0)
MONO ABS: 0.5 10*3/uL (ref 0.1–1.0)
MONOS PCT: 8 %
Neutro Abs: 3.7 10*3/uL (ref 1.7–7.7)
Neutrophils Relative %: 57 %
PLATELETS: 224 10*3/uL (ref 150–400)
RBC: 4.74 MIL/uL (ref 4.22–5.81)
RDW: 13.3 % (ref 11.5–15.5)
WBC: 6.4 10*3/uL (ref 4.0–10.5)

## 2017-06-01 LAB — ETHANOL: Alcohol, Ethyl (B): <10 mg/dL (ref <10)

## 2017-06-01 MED ORDER — HYDROXYZINE HCL 25 MG PO TABS
25.0000 mg | ORAL_TABLET | Freq: Four times a day (QID) | ORAL | Status: DC | PRN
  Administered 2017-06-01 – 2017-06-03 (×5): 25 mg via ORAL
  Filled 2017-06-01 (×5): qty 1

## 2017-06-01 MED ORDER — HYDROXYZINE HCL 25 MG PO TABS
25.0000 mg | ORAL_TABLET | Freq: Once | ORAL | Status: AC
  Administered 2017-06-01: 25 mg via ORAL
  Filled 2017-06-01: qty 1

## 2017-06-01 MED ORDER — ACETAMINOPHEN 325 MG PO TABS: 650.0000 mg | ORAL_TABLET | Freq: Four times a day (QID) | ORAL | Status: DC | PRN

## 2017-06-01 MED ORDER — IOPAMIDOL (ISOVUE-300) INJECTION 61%
75.0000 mL | Freq: Once | INTRAVENOUS | Status: AC | PRN
  Administered 2017-06-01: 75 mL via INTRAVENOUS

## 2017-06-01 MED ORDER — SODIUM CHLORIDE 0.9 % IV BOLUS
1000.0000 mL | Freq: Once | INTRAVENOUS | Status: AC
  Administered 2017-06-01: 1000 mL via INTRAVENOUS

## 2017-06-01 MED ORDER — TRAZODONE HCL 50 MG PO TABS
50.0000 mg | ORAL_TABLET | Freq: Every day | ORAL | Status: DC
  Administered 2017-06-01 – 2017-06-02 (×2): 50 mg via ORAL
  Filled 2017-06-01 (×2): qty 1

## 2017-06-01 MED ORDER — RISPERIDONE 0.25 MG PO TABS
0.2500 mg | ORAL_TABLET | Freq: Every day | ORAL | Status: DC
  Administered 2017-06-01 – 2017-06-03 (×3): 0.25 mg via ORAL
  Filled 2017-06-01 (×5): qty 1

## 2017-06-01 NOTE — Progress Notes (Signed)
Patient meets criteria for inpatient treatment. [facility] CSW faxed referrals to the following facilities for review.  [place], [facility] [address] [facility], [facility], [address] [facility], [place], [place], [facility], [facility], [facility], [address], [place], [place], [place], [address], [facility], [address], [place], [facility], [place], [place] [facility], [facility], [place], [address], [facility], [place], [place], [place] and [place].   TTS will continue to seek bed placement.   Moss McKy-sha Morse Brueggemann, MSW, LCSW-A, LCAS-A 06/01/2017 9:10 AM

## 2017-06-01 NOTE — ED Provider Notes (Signed)
[facility] EMERGENCY DEPARTMENT Provider Note   CSN: [id number] Arrival date & time: 05/31/17  2245     History   Chief Complaint Chief Complaint  Patient presents with  . Dysphagia    HPI Gilbert Meyers is a 32 y.o. male.  The history is provided by the patient and a parent.  Illness  This is a new problem. The current episode started more than 1 week ago. The problem occurs daily. The problem has been gradually worsening. Pertinent negatives include no chest pain and no abdominal pain. The symptoms are aggravated by swallowing. Nothing relieves the symptoms. He has tried nothing for the symptoms.   Patient with history of schizophrenia presents with multiple complaints.  He reports for the past 3 weeks he is been unable to eat food.  He reports he can take liquids, and some of his medications but not food.  He reports weight loss.  No significant pain with swallowing just feels that he cannot get food down.  No chest pain/abdominal pain.  He also mentions clicking in his left elbow for quite a while.  No new trauma recently. During the history, he starts thinking that his left elbow is causing the problems in his throat. He also reports he is having intense pain, and reports he feels that he is having a stroke at times, but is unable to really tell me what sort of pain he is having He reports he feels that he is having a stroke when he is in a car Past Medical History:  Diagnosis Date  . Anxiety   . Bipolar 1 disorder (HCC)   . Schizophrenia [facility])     Patient Active Problem List   Diagnosis Date Noted  . Hypertension 02/19/2017  . 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 No pertinent surgical history.      Home Medications    Prior to Admission medications   Medication Sig Start Date End Date Taking? Authorizing Provider  acetaminophen (TYLENOL) 325 MG tablet Take 650 mg by mouth every 6 (six) hours as needed.    [provider]    lisinopril (PRINIVIL,ZESTRIL) 5 MG tablet Take 1 tablet (5 mg total) by mouth daily. 02/19/17   Hoy Register, MD  risperiDONE (RISPERDAL) 0.25 MG tablet Take 1 tablet by mouth daily.  03/28/17   [provider]  traZODone (DESYREL) 50 MG tablet Take 1 tablet (50 mg total) by mouth at bedtime. 03/29/15   Shari Prows, MD    Family History Family History  Problem Relation Age of Onset  . Prostate cancer Paternal Grandfather   . Colon cancer Neg Hx     Social History Social History   Tobacco Use  . Smoking status: Current Some Day Smoker    Packs/day: 0.10    Types: Cigarettes  . Smokeless tobacco: Never Used  Substance Use Topics  . Alcohol use: No    Comment: former  . Drug use: No     Allergies   Patient has no known allergies.   Review of Systems Review of Systems  Constitutional: Positive for fatigue and unexpected weight change.  HENT: Positive for trouble swallowing.   Cardiovascular: Negative for chest pain.  Gastrointestinal: Negative for abdominal pain.  Musculoskeletal: Positive for arthralgias.  All other systems reviewed and are negative.    Physical Exam Updated Vital Signs BP (!) 126/115   Pulse 71   Temp 98.2 F (36.8 C)   Resp 18   Ht 1.778 m (  5\' 10" )   Wt 93.4 kg (206 lb)   SpO2 99%   BMI 29.56 kg/m   Physical Exam CONSTITUTIONAL: disheveled and mildly anxious HEAD: Normocephalic/atraumatic EYES: EOMI/PERRL ENMT: Mucous membranes moist, uvula midline, no erythema or edema, no stridor, normal phonation.  No drooling. NECK: supple no meningeal signs, no anterior lymphadenopathy SPINE/BACK:entire spine nontender CV: S1/S2 noted, no murmurs/rubs/gallops noted LUNGS: Lungs are clear to auscultation bilaterally, no apparent distress ABDOMEN: soft, nontender, no rebound or guarding, bowel sounds noted throughout abdomen GU:no cva tenderness NEURO: Pt is awake/alert/appropriate, moves all extremitiesx4.  No facial droop.   No focal weakness noted in the extremities EXTREMITIES: pulses normal/equal, full ROM, no deformities left elbow.  Distal pulses intact.  No edema.  When he flexes and extends his elbow there is clicking noted SKIN: warm, color normal PSYCH anxious  ED Treatments / Results  Labs (all labs ordered are listed, but only abnormal results are displayed) Labs Reviewed  COMPREHENSIVE METABOLIC PANEL - Abnormal; Notable for the following components:      Result Value   Calcium 8.8 (*)    Total Bilirubin 2.2 (*)    All other components within normal limits  RAPID URINE DRUG SCREEN, HOSP PERFORMED - Abnormal; Notable for the following components:   Barbiturates POSITIVE (*)    All other components within normal limits  CBC WITH DIFFERENTIAL/PLATELET  ETHANOL    EKG None  Radiology Dg Neck Soft Tissue  Result Date: 06/01/2017 CLINICAL DATA:  32 y/o M; unable to swallow fluid for 5 weeks. Initial encounter. EXAM: NECK SOFT TISSUES - 1+ VIEW COMPARISON:  None. FINDINGS: There is no evidence of retropharyngeal soft tissue swelling or epiglottic enlargement. Abnormal appearance of thyroid cartilage calcification. The cervical airway is unremarkable and no radio-opaque foreign body identified. IMPRESSION: Abnormal appearance of thyroid cartilage ossification, possibly projectional. Consider CT of the neck with contrast for further evaluation. Electronically Signed   By: Mitzi HansenLance  Furusawa-Stratton M.D.   On: 06/01/2017 00:32   Ct Soft Tissue Neck W Contrast  Result Date: 06/01/2017 CLINICAL DATA:  32 y/o  M; unable to drink fluids for 5 weeks. EXAM: CT NECK WITH CONTRAST TECHNIQUE: Multidetector CT imaging of the neck was performed using the standard protocol following the bolus administration of intravenous contrast. CONTRAST:  75mL ISOVUE-300 IOPAMIDOL (ISOVUE-300) INJECTION 61% COMPARISON:  05/31/2017 neck radiographs FINDINGS: Pharynx and larynx: Normal. No mass or swelling. Heterogeneous incomplete  ossification of the thyroid cartilage, within normal limits. Salivary glands: No inflammation, mass, or stone. Thyroid: Normal. Lymph nodes: None enlarged or abnormal density. Vascular: Negative. Limited intracranial: Negative. Visualized orbits: Negative. Mastoids and visualized paranasal sinuses: Clear. Skeleton: No acute or aggressive process. Upper chest: Negative. Other: None. IMPRESSION: 1. Heterogeneous incomplete ossification of thyroid cartilage, within normal limits. 2. No mass or inflammatory process of the aerodigestive tract. Negative CT of the neck. Electronically Signed   By: Mitzi HansenLance  Furusawa-Stratton M.D.   On: 06/01/2017 01:58    Procedures Procedures (including critical care time)  Medications Ordered in ED Medications  acetaminophen (TYLENOL) tablet 650 mg (has no administration in time range)  risperiDONE (RISPERDAL) tablet 0.25 mg (has no administration in time range)  traZODone (DESYREL) tablet 50 mg (has no administration in time range)  sodium chloride 0.9 % bolus 1,000 mL (1,000 mLs Intravenous New Bag/Given 06/01/17 0120)  iopamidol (ISOVUE-300) 61 % injection 75 mL (75 mLs Intravenous Contrast Given 06/01/17 0138)     Initial Impression / Assessment and Plan / ED Course  I have reviewed the triage vital signs and the nursing notes.  Pertinent labs & imaging results that were available during my care of the patient were reviewed by me and considered in my medical decision making (see chart for details).     12:08 AM Patient with multiple complaints.  Reports dysphagia for several weeks unable to eat food.  Will attempt to rule out medical cause at this time.  In terms of his arm, he can follow with orthopedics as it is not an acute issue.  Patient was seen today by outpatient behavioral health specialist, and they recommended ER visit and admission.  I do feel the patient will need to be admitted to behavioral health if  medical causes ruled out. Suspect his  dysphagia is likely due to his underlying psychiatric illness 1:20 AM Results reviewed.  CT imaging was recommended.  Patient is awake alert at this time 2:29 AM CT neck negative Patient is otherwise stable this time.  We will consult psych He can follow-up as an outpatient with gastroenterology for his dysphagia. Final Clinical Impressions(s) / ED Diagnoses   Final diagnoses:  Other dysphagia  Bipolar 1 disorder [facility])    ED Discharge Orders    None       Zadie Rhine, MD 06/01/17 0230

## 2017-06-01 NOTE — ED Notes (Signed)
[facility]ord [facility], recommendation inpatient placement, seeking placement
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
## 2017-06-01 NOTE — ED Notes (Signed)
Pt out at nurse's stating asking about his prognosis.  Advised pt that he was going to be admitted as an inpatient.  Pt asking for more IV fluids.  States he feels dehydrated.  Also asking to see the doctor.

## 2017-06-01 NOTE — ED Notes (Signed)
Pt dressed out into psych scrubs. Belongings secured in patient lockers.
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
## 2017-06-03 ENCOUNTER — Inpatient Hospital Stay (HOSPITAL_COMMUNITY)
Admission: AD | Admit: 2017-06-03 | Discharge: 2017-06-10 | DRG: 885 | Disposition: A | Discharge status: Home / Self Care | Payer: Medicaid Other | Source: Intra-hospital | Attending: Psychiatry | Admitting: Psychiatry

## 2017-06-03 ENCOUNTER — Other Ambulatory Visit: Payer: Self-pay

## 2017-06-03 ENCOUNTER — Encounter (HOSPITAL_COMMUNITY): Payer: Self-pay

## 2017-06-03 DIAGNOSIS — F419 Anxiety disorder, unspecified: Secondary | ICD-10-CM | POA: Diagnosis not present

## 2017-06-03 DIAGNOSIS — G4700 Insomnia, unspecified: Secondary | ICD-10-CM | POA: Diagnosis present

## 2017-06-03 DIAGNOSIS — R450 Nervousness: Secondary | ICD-10-CM | POA: Diagnosis not present

## 2017-06-03 DIAGNOSIS — F319 Bipolar disorder, unspecified: Secondary | ICD-10-CM | POA: Diagnosis present

## 2017-06-03 DIAGNOSIS — F1290 Cannabis use, unspecified, uncomplicated: Secondary | ICD-10-CM | POA: Diagnosis not present

## 2017-06-03 DIAGNOSIS — R1319 Other dysphagia: Secondary | ICD-10-CM | POA: Diagnosis not present

## 2017-06-03 DIAGNOSIS — F17290 Nicotine dependence, other tobacco product, uncomplicated: Secondary | ICD-10-CM | POA: Diagnosis present

## 2017-06-03 DIAGNOSIS — Z8042 Family history of malignant neoplasm of prostate: Secondary | ICD-10-CM

## 2017-06-03 DIAGNOSIS — F209 Schizophrenia, unspecified: Secondary | ICD-10-CM | POA: Diagnosis present

## 2017-06-03 DIAGNOSIS — F203 Undifferentiated schizophrenia: Secondary | ICD-10-CM | POA: Diagnosis present

## 2017-06-03 DIAGNOSIS — I10 Essential (primary) hypertension: Secondary | ICD-10-CM | POA: Diagnosis present

## 2017-06-03 DIAGNOSIS — Z560 Unemployment, unspecified: Secondary | ICD-10-CM | POA: Diagnosis not present

## 2017-06-03 MED ORDER — NICOTINE POLACRILEX 2 MG MT GUM
2.0000 mg | CHEWING_GUM | OROMUCOSAL | Status: DC | PRN
Start: 2017-06-03 — End: 2017-06-10
  Filled 2017-06-03: qty 1

## 2017-06-03 MED ORDER — ACETAMINOPHEN 325 MG PO TABS
650.0000 mg | ORAL_TABLET | Freq: Four times a day (QID) | ORAL | Status: DC | PRN
Start: 2017-06-03 — End: 2017-06-10
  Administered 2017-06-09: 650 mg via ORAL
  Filled 2017-06-03: qty 2

## 2017-06-03 MED ORDER — DICYCLOMINE HCL 10 MG PO CAPS
10.0000 mg | ORAL_CAPSULE | Freq: Once | ORAL | Status: AC
  Administered 2017-06-03: 10 mg via ORAL
  Filled 2017-06-03: qty 1

## 2017-06-03 MED ORDER — PNEUMOCOCCAL VAC POLYVALENT 25 MCG/0.5ML IJ INJ: 0.5000 mL | INJECTION | INTRAMUSCULAR | Status: DC

## 2017-06-03 MED ORDER — HYDROXYZINE HCL 25 MG PO TABS
25.0000 mg | ORAL_TABLET | Freq: Four times a day (QID) | ORAL | Status: DC | PRN
Start: 2017-06-03 — End: 2017-06-10
  Administered 2017-06-03 – 2017-06-09 (×10): 25 mg via ORAL
  Filled 2017-06-03 (×2): qty 1
  Filled 2017-06-03 (×2): qty 10
  Filled 2017-06-03 (×3): qty 1
  Filled 2017-06-03: qty 10
  Filled 2017-06-03 (×3): qty 1

## 2017-06-03 MED ORDER — LORAZEPAM 1 MG PO TABS
2.0000 mg | ORAL_TABLET | Freq: Once | ORAL | Status: AC
  Administered 2017-06-03: 2 mg via ORAL
  Filled 2017-06-03: qty 2

## 2017-06-03 MED ORDER — LOPERAMIDE HCL 2 MG PO CAPS
4.0000 mg | ORAL_CAPSULE | Freq: Once | ORAL | Status: AC
  Administered 2017-06-03: 4 mg via ORAL
  Filled 2017-06-03: qty 2

## 2017-06-03 MED ORDER — TRAZODONE HCL 50 MG PO TABS
50.0000 mg | ORAL_TABLET | Freq: Every day | ORAL | Status: DC
  Administered 2017-06-03 – 2017-06-04 (×2): 50 mg via ORAL
  Filled 2017-06-03 (×4): qty 1

## 2017-06-03 MED ORDER — RISPERIDONE 0.25 MG PO TABS
0.2500 mg | ORAL_TABLET | Freq: Every day | ORAL | Status: DC
  Administered 2017-06-04: 0.25 mg via ORAL
  Filled 2017-06-03 (×3): qty 1

## 2017-06-03 NOTE — Progress Notes (Signed)
Patient presents with pleasant/euthymic/animated affect and  behavior during admission interview and assessment. VS monitored and recorded. Skin check performed with Kedra MHT and revealed skin clean, dry, and intact. Contraband was not found. Patient was oriented to unit and schedule. Pt states "I came to the ED because I was having a really bad anxiety attack, but I feel great now. Ativan is one hell of a drug. I am so high right now". Pt denies SI/HI/AVH at this time. PO fluids provided. Safety maintained. Rest encouraged.

## 2017-06-03 NOTE — ED Notes (Signed)
Patient's father called stating that he wants his son to have inpatient treatment for his mental health. [facility] number.

## 2017-06-03 NOTE — BHH Counselor (Signed)
Re-assessment:   Patient report,"I am feeling better, I am feeling a little better each day." Report he's eating a little each day.   Denies SI, HI, and AVH.  Fransisca KaufmannLaura Davis, NP, continues to recommend inpatient due to patient history of schizophrenia.

## 2017-06-03 NOTE — ED Notes (Signed)
Patient states he is afraid of what will happen if he goes home. He states he was unable to tell TTS due to having diarrhea at time and needing to go to bathroom.  I asked pt why he felt he was unsafe going home he said he was afraid that he wouldn't eat and that it would ultimately lead to him going back to ED.

## 2017-06-03 NOTE — Tx Team (Signed)
Initial Treatment Plan 06/03/2017 9:30 PM Gilbert Meyers [id number]    PATIENT STRESSORS: Health problems Medication change or noncompliance   PATIENT STRENGTHS: Ability for insight Capable of independent living General fund of knowledge   PATIENT IDENTIFIED PROBLEMS: "coping skills for anxiety"  "review my meds--change them if I need to to a higher dose"                   DISCHARGE CRITERIA:  Ability to meet basic life and health needs Adequate post-discharge living arrangements Improved stabilization in mood, thinking, and/or behavior  PRELIMINARY DISCHARGE PLAN: Participate in family therapy Return to previous living arrangement Return to previous work or school arrangements  PATIENT/FAMILY INVOLVEMENT: This treatment plan has been presented to and reviewed with the patient, Gilbert Meyers.  The patient and family have been given the opportunity to ask questions and make suggestions.  Jonetta SpeakAshton E Earnie Rockhold, RN 06/03/2017, 9:30 PM

## 2017-06-03 NOTE — ED Notes (Signed)
Patient states [facility]. He also states that he wanted to go because he talked with his mother on the phone and he got a little upset. He states he is better now and wanted inpatient treatment.

## 2017-06-03 NOTE — ED Notes (Signed)
Pt given lunch tray.

## 2017-06-03 NOTE — Progress Notes (Signed)
Pt's father, Lenise Heralderry Wander, called to express his concern about his son's, "worsening condition". He shared that pt has been fixated on his arm pain, which prevents him from riding in the car with others and working. However, he states that his son is able to play football and video-games. Pt lives with his father and mother. Pt has been recently denied for disability benefits and they are reportedly appealing this decision. Father voiced concerns about his son's recent weight-loss and of feeling that this is more of a symptom of mental illness than a medical condition (issues with swallowing). He is concerned that he and his wife will be unable to care for pt if he returns home without, "more than two days of help". Pt's father is aware that [facility] and will speak to clinical staff working with him. He did share that pt experiences significant anxiety when riding in the car with others. CSW notified RN @ [facility] ED of this, as [facility].   Wells GuilesSarah Rayla Pember, LCSW, LCAS Disposition CSW [facility] [phone]

## 2017-06-03 NOTE — Progress Notes (Signed)
[facility]. Fransisca KaufmannLaura Davis, NP is the accepting provider.  Dr. Jama Flavorsobos is the attending provider.   Call report to [phone]. Pt is Voluntary.   Pt may be transported by [facility].  [facility] as [facility] is arranged.   Wells GuilesSarah Xsavier Seeley, LCSW, LCAS Disposition CSW [facility] [phone]

## 2017-06-04 DIAGNOSIS — F419 Anxiety disorder, unspecified: Secondary | ICD-10-CM

## 2017-06-04 DIAGNOSIS — F209 Schizophrenia, unspecified: Secondary | ICD-10-CM

## 2017-06-04 DIAGNOSIS — R450 Nervousness: Secondary | ICD-10-CM

## 2017-06-04 MED ORDER — RISPERIDONE 1 MG PO TABS
1.0000 mg | ORAL_TABLET | Freq: Two times a day (BID) | ORAL | Status: DC
  Administered 2017-06-04 – 2017-06-08 (×9): 1 mg via ORAL
  Filled 2017-06-04 (×15): qty 1

## 2017-06-04 NOTE — H&P (Signed)
Psychiatric Admission Assessment Adult  Patient Identification: Gilbert Meyers MRN:  [id number] Date of Evaluation:  06/04/2017 Chief Complaint:  schizophrenia Principal Diagnosis: Schizophrenia (HCC) Diagnosis:   Patient Active Problem List   Diagnosis Date Noted  . Schizophrenia (HCC) [F20.9] 06/03/2017  . Hypertension [I10] 02/19/2017  . Undifferentiated schizophrenia (HCC) [F20.3] 03/25/2015   History of Present Illness:   Gilbert Meyers is a 32 y/o M with history of schizophrenia who was admitted voluntarily from [facility] ED where he was brought voluntarily with worsening symptoms of psychosis including AH, poor intake of food and water for the past 5 weeks, anxiety, and somatic delusions regarding his jaw, throat, and arm. Pt had also reported use of cannabis about once per month prior to admission. Pt had been changed about 5 weeks ago from Navane to risperdal 0.25mg  po qDay, and pt reports he has been maintaining good adherence to that dose prior to admission.  Upon initial presentation, pt shares, "I wasn't eating. The tenseness of what was going on. I'm just so tense. I worry about it." Pt describes anxiety regarding delusions that he may choke. He describes cutting his food intake to minimal or none for up to about 5 weeks, and he has lost about 35 lbs during that period. He describes his hunger as normal, and he has been able to start eating solid foods during his stay at [facility] and at [facility]. Pt describes AH which say demeaning things but are not command in nature. He denies SI/HI/VH. He is sleeping adequately. He has some mild depressed mood, anhedonia, and poor concentration, but he otherwise denies symptoms of depression. He denies symptoms of mania, OCD, and PTSD. He uses cannabis about once per month, and he denies other illicit substance use.   Discussed with patient about treatment options. He would like to stay on risperdal and suggests using a higher dose. He also utilized  PRN medications of atarax and trazodone during his stay so far, and he finds that they have been useful, so he would like to continue those medications. Pt had no further questions, comments, or concerns.  Associated Signs/Symptoms: Depression Symptoms:  depressed mood, anhedonia, difficulty concentrating, anxiety, weight loss, (Hypo) Manic Symptoms:  Delusions, Hallucinations, Anxiety Symptoms:  Excessive Worry, Psychotic Symptoms:  Delusions, Hallucinations: Auditory Paranoia, PTSD Symptoms: NA Total Time spent with patient: 1 hour  Past Psychiatric History:  - previous diagnosis of schizophrenia - 3 previous inpatient stays with last being in 2017 to [facility] Dr. Geanie Cooley at [facility] - No previous suicide attempts  Is the patient at risk to self? Yes.    Has the patient been a risk to self in the past 6 months? Yes.    Has the patient been a risk to self within the distant past? Yes.    Is the patient a risk to others? No.  Has the patient been a risk to others in the past 6 months? No.  Has the patient been a risk to others within the distant past? No.   Prior Inpatient Therapy:   Prior Outpatient Therapy:    Alcohol Screening: 1. How often do you have a drink containing alcohol?: Never 2. How many drinks containing alcohol do you have on a typical day when you are drinking?: 1 or 2 3. How often do you have six or more drinks on one occasion?: Never AUDIT-C Score: 0 Substance Abuse History in the last 12 months:  Yes.   Consequences of Substance Abuse: Medical Consequences:  worsened psychosis Previous Psychotropic Medications: Yes  Psychological Evaluations: Yes  Past Medical History:  Past Medical History:  Diagnosis Date  . Anxiety   . Bipolar 1 disorder (HCC)   . Schizophrenia (HCC)    History reviewed. No pertinent surgical history. Family History:  Family History  Problem Relation Age of Onset  . Prostate cancer Paternal Grandfather   . Colon cancer  Neg Hx    Family Psychiatric  History: 3 maternal cousins have bipolar disorder, maternal cousin has history of overdose of methamphetamine, and maternal grandmother has history of unknown mental illness. Tobacco Screening:   Social History:  [place] He lives with his parens. He completed a BS in sports management. He does not work currently. He has never been married and he has no children. He denies legal and trauma history. Social History   Substance and Sexual Activity  Alcohol Use No   Comment: former     Social History   Substance and Sexual Activity  Drug Use Yes  . Types: Marijuana    Additional Social History:                           Allergies:  No Known Allergies Lab Results: No results found for this or any previous visit (from the past 48 hour(s)).  Blood Alcohol level:  Lab Results  Component Value Date   ETH <10 06/01/2017   ETH <10 05/09/2017    Metabolic Disorder Labs:  Lab Results  Component Value Date   HGBA1C 5.2 03/25/2015   Lab Results  Component Value Date   PROLACTIN 22.9 (H) 03/25/2015   Lab Results  Component Value Date   CHOL 208 (H) 03/25/2015   TRIG 168 (H) 03/25/2015   HDL 38 (L) 03/25/2015   CHOLHDL 5.5 03/25/2015   VLDL 34 03/25/2015   LDLCALC 136 (H) 03/25/2015    Current Medications: Current Facility-Administered Medications  Medication Dose Route Frequency Provider Last Rate Last Dose  . acetaminophen (TYLENOL) tablet 650 mg  650 mg Oral Q6H PRN Fransisca Kaufmannavis, Laura A, NP      . hydrOXYzine (ATARAX/VISTARIL) tablet 25 mg  25 mg Oral Q6H PRN Thermon Leylandavis, Laura A, NP   25 mg at 06/03/17 2149  . nicotine polacrilex (NICORETTE) gum 2 mg  2 mg Oral PRN Kerry HoughSimon, Spencer E, PA-C      . pneumococcal 23 valent vaccine (PNU-IMMUNE) injection 0.5 mL  0.5 mL Intramuscular Tomorrow-1000 Simon, Spencer E, PA-C      . risperiDONE (RISPERDAL) tablet 1 mg  1 mg Oral BID Micheal Likensainville, Justiss Gerbino T, MD   1 mg at  06/04/17 1201  . traZODone (DESYREL) tablet 50 mg  50 mg Oral QHS Fransisca Kaufmannavis, Laura A, NP   50 mg at 06/03/17 2149   PTA Medications: Medications Prior to Admission  Medication Sig Dispense Refill Last Dose  . lisinopril (PRINIVIL,ZESTRIL) 5 MG tablet Take 1 tablet (5 mg total) by mouth daily. (Patient not taking: Reported on 06/01/2017) 30 tablet 3 Not Taking at Unknown time  . risperiDONE (RISPERDAL) 0.25 MG tablet Take 1 tablet by mouth daily.   2 05/31/2017 at Unknown time  . traZODone (DESYREL) 50 MG tablet Take 1 tablet (50 mg total) by mouth at bedtime. (Patient not taking: Reported on 06/01/2017) 30 tablet 0 Not Taking at Unknown time    Musculoskeletal: Strength & Muscle Tone: within normal limits Gait & Station: normal Patient leans: N/A  Psychiatric Specialty Exam: Physical Exam  Nursing note and vitals reviewed.   Review of Systems  Constitutional: Positive for weight loss. Negative for chills and fever.  Respiratory: Negative for cough and shortness of breath.   Cardiovascular: Negative for chest pain.  Gastrointestinal: Negative for heartburn and nausea.  Psychiatric/Behavioral: Positive for hallucinations and substance abuse. Negative for depression and suicidal ideas. The patient is nervous/anxious. The patient does not have insomnia.     Blood pressure (!) 122/99, pulse 85, temperature 98.3 F (36.8 C), temperature source Oral, resp. rate 17, height 5\' 10"  (1.778 m), weight 92.1 kg (203 lb), SpO2 100 %.Body mass index is 29.13 kg/m.  General Appearance: Casual and Fairly Groomed  Eye Contact:  Good  Speech:  Clear and Coherent and Normal Rate  Volume:  Normal  Mood:  Euthymic  Affect:  Flat  Thought Process:  Coherent and Goal Directed  Orientation:  Full (Time, Place, and Person)  Thought Content:  Delusions and Hallucinations: Auditory  Suicidal Thoughts:  No  Homicidal Thoughts:  No  Memory:  Immediate;   Fair Recent;   Fair Remote;   Fair  Judgement:  Fair   Insight:  Lacking  Psychomotor Activity:  Normal  Concentration:  Concentration: Fair  Recall:  [employer] of Knowledge:  Fair  Language:  Fair  Akathisia:  No  Handed:    AIMS (if indicated):     Assets:  Communication Skills Resilience Social Support  ADL's:  Intact  Cognition:  WNL  Sleep:  Number of Hours: 6.75   Treatment Plan Summary: Daily contact with patient to assess and evaluate symptoms and progress in treatment and Medication management  Observation Level/Precautions:  15 minute checks  Laboratory:  CBC Chemistry Profile HbAIC UDS UA  Psychotherapy:  Encourage participation in groups and therapeutic milieu   Medications:  Change risperdal 0.25mg  po qhs to risperdal 1mg  po BID, continue atarax 25mg  po q6h prn anxiety, continue trazodone 50mg  po qhs.  Consultations:    Discharge Concerns:    Estimated LOS: 5-7 days  Other:     Physician Treatment Plan for Primary Diagnosis: Schizophrenia (HCC) Long Term Goal(s): Improvement in symptoms so as ready for discharge  Short Term Goals: Ability to identify and develop effective coping behaviors will improve  Physician Treatment Plan for Secondary Diagnosis: Principal Problem:   Schizophrenia (HCC)  Long Term Goal(s): Improvement in symptoms so as ready for discharge  Short Term Goals: Ability to identify changes in lifestyle to reduce recurrence of condition will improve  I certify that inpatient services furnished can reasonably be expected to improve the patient's condition.    Micheal Likens, MD 4/2/20191:36 PM

## 2017-06-04 NOTE — Progress Notes (Signed)
Recreation Therapy Notes  Date: 4.2.19 Time: 10:00 p.m. Location: [address] Dayroom   Group Topic: Self-Esteem   Goal Area(s) Addresses:  Goal 1.1: To increase self-esteem  - Group will improve mood through participation during Recreation Therapy tx.  - Group will identify at least one positive affirmation by the end of therapy session.   - Group will identify the importance of self-esteem    Behavioral Response: Appropriate   Intervention: Art   Activity: Patients were to choose a positive affirmation template. Patents were then given the opportunity to design their poster using the colored pencils and markers provided. Afterwards, Patients shared their positive affirmation to the group and explained how it can help improve their self-esteem.   Education: Self-Esteem   Education Outcome: Acknowledges Education  Clinical Observations/Feedback: Patient attended and participated appropriately during Recreation Therapy group treatment. Patient was able to share poster with peers, successfully identifying the positive affirmation they chose and how it can improve their self esteem.  Patient participated during opening and closing discussion. Patient successfully met Goal 1.1 (see above).   Ranell Patrick, Recreation Therapy Intern  Ranell Patrick 06/04/2017 11:15 AM

## 2017-06-04 NOTE — Progress Notes (Signed)
Adult Psychoeducational Group Note  Date:  06/04/2017 Time:  9:18 PM  Group Topic/Focus:  Wrap-Up Group:   The focus of this group is to help patients review their daily goal of treatment and discuss progress on daily workbooks.  Participation Level:  Active  Participation Quality:  Appropriate  Affect:  Appropriate  Cognitive:  Appropriate  Insight: Appropriate  Engagement in Group:  Engaged  Modes of Intervention:  Discussion  Additional Comments:  The patient expressed that he rates today a 7.The patient also said that he learn in group to not give up.  Octavio Mannshigpen, Olean Sangster Lee 06/04/2017, 9:18 PM

## 2017-06-04 NOTE — Plan of Care (Signed)
Patient is safe and free from injury.  Routine safety checks maintained every 15 minutes.  Patient is visible in milieu for therapy and activities.  Observed interacting with peers on the unit.

## 2017-06-04 NOTE — Progress Notes (Signed)
Patient ID: Gilbert GrahamJeremy Meyers, male   DOB: 12/08/1985, 32 y.o.   MRN: [id number]  D: Patient in dayroom on approach. Pt reports he had a good day with family visiting. Pt mood and affect appeared anxious. Pt reports he is tolerating medication well. Pt behavior is appropriate. Pt denies SI/HI/AVH and pain. Pt attended and participated in evening wrap up group. Cooperative with assessment.  A: Medications administered as prescribed. Support and encouragement provided to attend groups and engage in milieu. Pt encouraged to discuss feelings and come to staff with any question or concerns.  R: Patient remains safe and complaint with medications.

## 2017-06-04 NOTE — [facility] Group Notes (Signed)
LCSW Group Therapy Note  06/04/2017 1:15pm  Type of Therapy/Topic:  Group Therapy:  Feelings about Diagnosis  Participation Level:  Minimal   Description of Group:   This group will allow patients to explore their thoughts and feelings about diagnoses they have received. Patients will be guided to explore their level of understanding and acceptance of these diagnoses. Facilitator will encourage patients to process their thoughts and feelings about the reactions of others to their diagnosis and will guide patients in identifying ways to discuss their diagnosis with significant others in their lives. This group will be process-oriented, with patients participating in exploration of their own experiences, giving and receiving support, and processing challenge from other group members.   Therapeutic Goals: 1. Patient will demonstrate understanding of diagnosis as evidenced by identifying two or more symptoms of the disorder 2. Patient will be able to express two feelings regarding the diagnosis 3. Patient will demonstrate their ability to communicate their needs through discussion and/or role play  Summary of Patient Progress:   In and out several times, engaged whenb present.  Talked about his faith as the thing that helps him through difficutl times. Stated that he has not worked for 5 years, and he sits around all day and plays video ganmes.    Therapeutic Modalities:   Cognitive Behavioral Therapy Brief Therapy Feelings Identification    Gilbert [name] [name], LCSW 06/04/2017 12:24 PM

## 2017-06-04 NOTE — [facility] Suicide Risk Assessment (Signed)
[facility]   Nursing information obtained from:    Demographic factors:    Current Mental Status:    Loss Factors:    Historical Factors:    Risk Reduction Factors:     Total Time spent with patient: 1 hour Principal Problem: Schizophrenia (HCC) Diagnosis:   Patient Active Problem List   Diagnosis Date Noted  . Schizophrenia (HCC) [F20.9] 06/03/2017  . Hypertension [I10] 02/19/2017  . Undifferentiated schizophrenia (HCC) [F20.3] 03/25/2015   Subjective Data: See H&P for details  Continued Clinical Symptoms:    The "Alcohol Use Disorders Identification Test", Guidelines for Use in Primary Care, Second Edition.  World Science writerHealth Organization [facility](WHO). Score between 0-7:  no or low risk or alcohol related problems. Score between 8-15:  moderate risk of alcohol related problems. Score between 16-19:  high risk of alcohol related problems. Score 20 or above:  warrants further diagnostic evaluation for alcohol dependence and treatment.   CLINICAL FACTORS:   Schizophrenia:   Paranoid or undifferentiated type  Psychiatric Specialty Exam: Physical Exam  Nursing note and vitals reviewed.   ROS - See H&P for details  Blood pressure (!) 122/99, pulse 85, temperature 98.3 F (36.8 C), temperature source Oral, resp. rate 17, height 5\' 10"  (1.778 m), weight 92.1 kg (203 lb), SpO2 100 %.Body mass index is 29.13 kg/m.   COGNITIVE FEATURES THAT CONTRIBUTE TO RISK:  None    SUICIDE RISK:   Minimal: No identifiable suicidal ideation.  Patients presenting with no risk factors but with morbid ruminations; may be classified as minimal risk based on the severity of the depressive symptoms  PLAN OF CARE: See H&P for details  I certify that inpatient services furnished can reasonably be expected to improve the patient's condition.   Micheal Likenshristopher T Charles Niese, MD 06/04/2017, 1:48 PM

## 2017-06-04 NOTE — Progress Notes (Signed)
Recreation Therapy Notes  INPATIENT RECREATION THERAPY ASSESSMENT  Patient Details Name: Gilbert GrahamJeremy Durnell MRN: [id number] DOB: 09/19/1985 Today's Date: 06/04/2017       Information Obtained From: Patient  Able to Participate in Assessment/Interview: Yes  Patient Presentation: Responsive  Reason for Admission (Per Patient): Active Symptoms Patient reports being admitted into the hospital due to depression and hearing voices in his head. Patient also reports a change in mediciation which he believes has caused a problem with him not being able to eat  Patient Stressors: Family  Coping Skills:   Isolation, Avoidance, Arguments, Aggression, Art, Journal, Write, Sports  Leisure Interests (2+):  Games - Video games, Art - Advice workerDraw  Frequency of Recreation/Participation: Weekly  Awareness of Community Resources:  Yes  Community Resources:  [employer], Research scientist (physical sciences)Movie Theaters  Current Use: No  If no, Barriers?: Social  Expressed Interest in State Street CorporationCommunity Resource Information: No  Enbridge EnergyCounty of Residence:  [place]   Patient Main Form of Transportation: Set designerCar  Patient Strengths:  Patient was not able to identify any strengths   Patient Identified Areas of Improvement:  "Get well, get better"   Patient Goal for Hospitalization:  "To be healthy to live a normal life"  Current SI (including self-harm):  No  Current HI:  No  Current AVH: No  Staff Intervention Plan: Group Attendance, Collaborate with Interdisciplinary Treatment Team  Consent to Intern Participation: Yes   Sheryle Hailarian Syan Cullimore, Recreation Therapy Intern   Sheryle HailDarian Avalene Sealy 06/04/2017, 1:43 PM

## 2017-06-05 LAB — LIPID PANEL
CHOL/HDL RATIO: 4.1 ratio
Cholesterol: 142 mg/dL (ref 0–200)
HDL: 35 mg/dL — AB (ref >40)
LDL CALC: 74 mg/dL (ref 0–99)
TRIGLYCERIDES: 163 mg/dL — AB (ref <150)
VLDL: 33 mg/dL (ref 0–40)

## 2017-06-05 LAB — TSH: TSH: 8.963 u[IU]/mL — ABNORMAL HIGH (ref 0.350–4.500)

## 2017-06-05 LAB — HEMOGLOBIN A1C
HEMOGLOBIN A1C: 5.4 % (ref 4.8–5.6)
Mean Plasma Glucose: 108.28 mg/dL

## 2017-06-05 MED ORDER — TRAZODONE HCL 50 MG PO TABS
50.0000 mg | ORAL_TABLET | Freq: Every evening | ORAL | Status: DC | PRN
  Administered 2017-06-05 – 2017-06-08 (×4): 50 mg via ORAL
  Filled 2017-06-05 (×3): qty 1

## 2017-06-05 NOTE — [facility] Group Notes (Signed)
LCSW Group Therapy Note 06/05/2017 1:15pm  Type of Therapy and Topic:  Group Therapy:  Setting Goals  Participation Level:  Active  Description of Group: In this process group, patients discussed using strengths to work toward goals and address challenges.  Patients identified two positive things about themselves and one goal they were working on.  Patients were given the opportunity to share openly and support each other's plan for self-empowerment.  The group discussed the value of gratitude and were encouraged to have a daily reflection of positive characteristics or circumstances.  Patients were encouraged to identify a plan to utilize their strengths to work on current challenges and goals.  Therapeutic Goals 1. Patient will verbalize personal strengths/positive qualities and relate how these can assist with achieving desired personal goals 2. Patients will verbalize affirmation of peers plans for personal change and goal setting 3. Patients will explore the value of gratitude and positive focus as related to successful achievement of goals 4. Patients will verbalize a plan for regular reinforcement of personal positive qualities and circumstances.  Summary of Patient Progress:   Stayed the entire time, engaged throughout.  "My goal is get healthier, and also get a job."  Identified the first step as taking meds daily.  Led to a discussion about apps that can remind you to take pills.  Good feedback to another patient about using drugs.    Therapeutic Modalities Cognitive Behavioral Therapy Motivational Interviewing    Ida [name] Gwenevere Goga, KentuckyLCSW 06/05/2017 1:16 PM

## 2017-06-05 NOTE — Progress Notes (Signed)
Pt on unit and interacting with staff.  Pt states he feels better but is OK with being here until the Dr. Fredna DowSays he is ready to leave.  Pt sts he is eating solid food and keeping it down.  Pt denies SI, HI and AVH.  Pt contracts for safety. Pt is med compliant, and attends group Pt remains safe on unit.

## 2017-06-05 NOTE — Progress Notes (Signed)
[facility]  06/05/2017 4:19 PM Gilbert GrahamJeremy Roehr  MRN:  [id number] Subjective:    Gilbert GrahamJeremy Slappey is a 32 y/o M with history of schizophrenia who was admitted voluntarily from [facility] ED where he was brought voluntarily with worsening symptoms of psychosis including AH, poor intake of food and water for the past 5 weeks, anxiety, and somatic delusions regarding his jaw, throat, and arm. Pt had also reported use of cannabis about once per month prior to admission. Pt had been changed about 5 weeks ago from Navane to risperdal 0.25mg  po qDay, and pt reports he has been maintaining good adherence to that dose prior to admission. Pt was restarted on risperdal at higher dose [facility]. He had improvement of his poor intake of food and water during his admission.  Today upon evaluation, pt shares, "I'm doing okay, just feel a little tired; it might be that my medication is a lot more than it was." Pt denies other physical complaints. He is sleeping well. His appetite is good and he is having adequate intake of food/hydration. He denies SI/HI/AH/VH. He denies other specific concerns today. He is in agreement to continue his current regimen without changes. Pt is in agreement to allow SW team to place referral to outpatient follow up with psychiatry after discharge. He had no further questions, comments, or concerns today.  Principal Problem: Schizophrenia (HCC) Diagnosis:   Patient Active Problem List   Diagnosis Date Noted  . Schizophrenia (HCC) [F20.9] 06/03/2017  . Hypertension [I10] 02/19/2017  . 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 Anxiety   . Bipolar 1 disorder (HCC)   . Schizophrenia (HCC)    History reviewed. No pertinent surgical history. Family History:  Family History  Problem Relation Age of Onset  .  Prostate cancer Paternal Grandfather   . Colon cancer Neg Hx    Family Psychiatric  History: see H&P Social History:  Social History   Substance and Sexual Activity  Alcohol Use No   Comment: former     Social History   Substance and Sexual Activity  Drug Use Yes  . Types: Marijuana    Social History   Socioeconomic History  . Marital status: Single    Spouse name: Not on file  . Number of children: 0  . Years of education: Not on file  . Highest education level: Not on file  Occupational History  . Occupation: Unemployed   Social Needs  . Financial resource strain: Not on file  . Food insecurity:    Worry: Not on file    Inability: Not on file  . Transportation needs:    Medical: Not on file    Non-medical: Not on file  Tobacco Use  . Smoking status: Current Some Day Smoker    Packs/day: 0.10    Types: E-cigarettes  . Smokeless tobacco: Never Used  Substance and Sexual Activity  . Alcohol use: No    Comment: former  . Drug use: Yes    Types: Marijuana  . Sexual activity: Not Currently  Lifestyle  . Physical activity:    Days per week: Not on file    Minutes per session: Not on file  . Stress: Not on file  Relationships  . Social connections:    Talks on phone: Not on file    Gets  together: Not on file    Attends religious service: Not on file    Active member of club or organization: Not on file    Attends meetings of clubs or organizations: Not on file    Relationship status: Not on file  Other Topics Concern  . Not on file  Social History Narrative  . Not on file   Additional Social History:                         Sleep: Good  Appetite:  Good  Current Medications: Current Facility-Administered Medications  Medication Dose Route Frequency Provider Last Rate Last Dose  . acetaminophen (TYLENOL) tablet 650 mg  650 mg Oral Q6H PRN Thermon Leyland, NP      . hydrOXYzine (ATARAX/VISTARIL) tablet 25 mg  25 mg Oral Q6H PRN Thermon Leyland, NP   25 mg at 06/04/17 2115  . nicotine polacrilex (NICORETTE) gum 2 mg  2 mg Oral PRN Kerry Hough, PA-C      . pneumococcal 23 valent vaccine (PNU-IMMUNE) injection 0.5 mL  0.5 mL Intramuscular Tomorrow-1000 Simon, Spencer E, PA-C      . risperiDONE (RISPERDAL) tablet 1 mg  1 mg Oral BID Micheal Likens, MD   1 mg at 06/05/17 0844  . traZODone (DESYREL) tablet 50 mg  50 mg Oral QHS Thermon Leyland, NP   50 mg at 06/04/17 2115    Lab Results:  Results for orders placed or performed during the hospital encounter of 06/03/17 (from the past 48 hour(s))  Lipid panel     Status: Abnormal   Collection Time: 06/05/17  6:36 AM  Result Value Ref Range   Cholesterol 142 0 - 200 mg/dL   Triglycerides 161 (H) <150 mg/dL   HDL 35 (L) >09 mg/dL   Total CHOL/HDL Ratio 4.1 RATIO   VLDL 33 0 - 40 mg/dL   LDL Cholesterol 74 0 - 99 mg/dL    Comment:        Total Cholesterol/HDL:CHD Risk Coronary Heart Disease Risk Table                     Men   Women  1/2 Average Risk   3.4   3.3  Average Risk       5.0   4.4  2 X Average Risk   9.6   7.1  3 X Average Risk  23.4   11.0        Use the calculated Patient Ratio above and the CHD Risk Table to determine the patient's CHD Risk.        ATP III CLASSIFICATION (LDL):  <100     mg/dL   Optimal  604-540  mg/dL   Near or Above                    Optimal  130-159  mg/dL   Borderline  981-191  mg/dL   High  >478     mg/dL   Very High Performed at [facility], [address], [place], [place] [address]   Hemoglobin A1c     Status: None   Collection Time: 06/05/17  6:36 AM  Result Value Ref Range   Hgb A1c MFr Bld 5.4 4.8 - 5.6 %    Comment: (NOTE) Pre diabetes:          5.7%-6.4% Diabetes:              >  6.4% Glycemic control for   <7.0% adults with diabetes    Mean Plasma Glucose 108.28 mg/dL    Comment: Performed at [facility], [address] [place], [place] [address]  TSH     Status: Abnormal    Collection Time: 06/05/17  6:36 AM  Result Value Ref Range   TSH 8.963 (H) 0.350 - 4.500 uIU/mL    Comment: Performed by a 3rd Generation assay with a functional sensitivity of <=0.01 uIU/mL. Performed at [facility], [address] [place], [place] [address]     Blood Alcohol level:  Lab Results  Component Value Date   ETH <10 06/01/2017   ETH <10 05/09/2017    Metabolic Disorder Labs: Lab Results  Component Value Date   HGBA1C 5.4 06/05/2017   MPG 108.28 06/05/2017   Lab Results  Component Value Date   PROLACTIN 22.9 (H) 03/25/2015   Lab Results  Component Value Date   CHOL 142 06/05/2017   TRIG 163 (H) 06/05/2017   HDL 35 (L) 06/05/2017   CHOLHDL 4.1 06/05/2017   VLDL 33 06/05/2017   LDLCALC 74 06/05/2017   LDLCALC 136 (H) 03/25/2015    Physical Findings: AIMS:  , ,  ,  ,    CIWA:    COWS:     Musculoskeletal: Strength & Muscle Tone: within normal limits Gait & Station: normal Patient leans: N/A  Psychiatric Specialty Exam: Physical Exam  Nursing note and vitals reviewed.   Review of Systems  Constitutional: Positive for malaise/fatigue. Negative for chills and fever.  Respiratory: Negative for cough and shortness of breath.   Cardiovascular: Negative for chest pain.  Gastrointestinal: Negative for abdominal pain, heartburn, nausea and vomiting.  Psychiatric/Behavioral: Negative for depression, hallucinations and suicidal ideas. The patient is not nervous/anxious and does not have insomnia.     Blood pressure (!) 94/56, pulse (!) 119, temperature 98.7 F (37.1 C), temperature source Oral, resp. rate 18, height 5\' 10"  (1.778 m), weight 92.1 kg (203 lb), SpO2 100 %.Body mass index is 29.13 kg/m.  General Appearance: Casual and Fairly Groomed  Eye Contact:  Good  Speech:  Clear and Coherent and Normal Rate  Volume:  Normal  Mood:  Euthymic  Affect:  Flat  Thought Process:  Coherent and Goal Directed  Orientation:  Full (Time,  Place, and Person)  Thought Content:  Logical  Suicidal Thoughts:  No  Homicidal Thoughts:  No  Memory:  Immediate;   Fair Recent;   Fair Remote;   Fair  Judgement:  Fair  Insight:  Fair  Psychomotor Activity:  Normal  Concentration:  Concentration: Fair  Recall:  [employer] of Knowledge:  Fair  Language:  Fair  Akathisia:  No  Handed:    AIMS (if indicated):     Assets:  Communication Skills Desire for Improvement Financial Resources/Insurance Housing Physical Health Resilience Social Support  ADL's:  Intact  Cognition:  WNL  Sleep:  Number of Hours: 6.75   Treatment Plan Summary: Daily contact with patient to assess and evaluate symptoms and progress in treatment and Medication management   -Continue inpatient hospitalization  - Schizophrenia   - Continue risperdal 1mg  po BID  -Anxiety    -Continue atarax 25mg  po q6h prn anxiety  - Insomnia   -Continue trazodone 50mg  po qhs  -Encourage participation in groups and therapeutic milieu  -Disposition planning will be ongoing  Micheal Likens, MD 06/05/2017, 4:19 PM

## 2017-06-05 NOTE — B[facility] Counselor (Signed)
Adult Comprehensive Assessment  Patient ID: Gilbert Meyers, male   DOB: 1985-11-04, 32 y.o.   MRN: [id number]  Information Source: Information source: Patient  Current Stressors:  Educational / Learning stressors: None reported  Employment / Job issues: Unemployed  Family Relationships: Lives with parents Financial / Lack of resources (include bankruptcy): No income. Housing / Lack of housing: Pt lives with parents  Physical health (include injuries &life threatening diseases): None reported  Social relationships: None reported  Substance abuse: Pt denies use.  Bereavement / Loss:    Living/Environment/Situation:  Living Arrangements: Parent Living conditions (as described by patient or guardian): Good, but wants his own place.  How long has patient lived in current situation?: "Basically all my life" What is atmosphere in current home: Comfortable, Supportive  Family History:  Marital status: Single Are you sexually active?: No What is your sexual orientation?: Heterosexual Has your sexual activity been affected by drugs, alcohol, medication, or emotional stress?: None reported  Does patient have children?: No  Childhood History:  By whom was/is the patient raised?: Both parents Description of patient's relationship with caregiver when they were a child: Good relationship Patient's description of current relationship with people who raised him/her: Good relationship  How were you disciplined when you got in trouble as a child/adolescent?: None reported  Does patient have siblings?: Yes Number of Siblings: 2 Description of patient's current relationship with siblings: sister, brother; good relationship  Did patient suffer any verbal/emotional/physical/sexual abuse as a child?: No Did patient suffer from severe childhood neglect?: No Has patient ever been sexually abused/assaulted/raped as an adolescent or adult?: No Was the patient ever a victim of a crime or a disaster?:  No Witnessed domestic violence?: No Has patient been effected by domestic violence as an adult?: No  Education:  Highest grade of school patient has completed: BS Sports Management Currently a student?: No Learning disability?: No  Employment/Work Situation:  Employment situation: Unemployed Patient's job has been impacted by current illness: No What is the longest time patient has a held a job?: 6 months  Where was the patient employed at that time?: [employer] Has patient ever been in the [employer]?: No  Financial Resources:  Surveyor, quantity resources: Income from employment, Support from parents / caregiver Does patient have a Lawyer or guardian?: No  Alcohol/Substance Abuse:  What has been your use of drugs/alcohol within the last 12 months?: Denies  Alcohol/Substance Abuse Treatment Hx: Denies past history Has alcohol/substance abuse ever caused legal problems?: No  Social Support System: Conservation officer, nature Support System: Good Describe Community Support System: Family  Type of faith/religion: Christianity  How does patient's faith help to cope with current illness?: prayer  Leisure/Recreation:  Leisure and Hobbies: social media   Strengths/Needs:  What things does the patient do well?: unable to answer  In what areas does patient struggle / problems for patient: Pt states "my mind was messing with me."   Discharge Plan:  Does patient have access to transportation?: Yes Will patient be returning to same living situation after discharge?: Yes Currently receiving community mental health services: Yes  [facility] If no, would patient like referral for services when discharged?:Does patient have financial barriers related to discharge medications?: Yes Patient description of barriers related to discharge medications: No insurance, no income   Summary/Recommendations:   Summary and Recommendations (to be completed by the evaluator): Gilbert Meyers is a 32 YO  Caucasian male diagnosed with Schizophrenia.  He presents voluntarily with psychosis; he has a belief that he  is unable to eat solid foods and lost a signifiant amount of weight as a result.  Gilbert Meyers also suffers from extreme anxiety, rendering unable to ride in a vehicle.  At d/c, he will return home and [facility]  He is being referred to the CST team.  While here, Gilbert Meyers can benefit from crises stabilization, medication management, therapeutic milieu and referral for services.  Gilbert Meyers. 06/05/2017

## 2017-06-05 NOTE — Tx Team (Signed)
Interdisciplinary Treatment and Diagnostic Plan Update  06/05/2017 Time of Session: 10:26 AM  Gilbert Meyers MRN: [id number]  Principal Diagnosis: Schizophrenia [facility])  Secondary Diagnoses: Principal Problem:   Schizophrenia (HCC)   Current Medications:  Current Facility-Administered Medications  Medication Dose Route Frequency Provider Last Rate Last Dose  . acetaminophen (TYLENOL) tablet 650 mg  650 mg Oral Q6H PRN Thermon Leyland, NP      . hydrOXYzine (ATARAX/VISTARIL) tablet 25 mg  25 mg Oral Q6H PRN Thermon Leyland, NP   25 mg at 06/04/17 2115  . nicotine polacrilex (NICORETTE) gum 2 mg  2 mg Oral PRN Kerry Hough, PA-C      . pneumococcal 23 valent vaccine (PNU-IMMUNE) injection 0.5 mL  0.5 mL Intramuscular Tomorrow-1000 Simon, Spencer E, PA-C      . risperiDONE (RISPERDAL) tablet 1 mg  1 mg Oral BID Micheal Likens, MD   1 mg at 06/05/17 0844  . traZODone (DESYREL) tablet 50 mg  50 mg Oral QHS Fransisca Kaufmann A, NP   50 mg at 06/04/17 2115    PTA Medications: Medications Prior to Admission  Medication Sig Dispense Refill Last Dose  . lisinopril (PRINIVIL,ZESTRIL) 5 MG tablet Take 1 tablet (5 mg total) by mouth daily. (Patient not taking: Reported on 06/01/2017) 30 tablet 3 Not Taking at Unknown time  . risperiDONE (RISPERDAL) 0.25 MG tablet Take 1 tablet by mouth daily.   2 05/31/2017 at Unknown time  . traZODone (DESYREL) 50 MG tablet Take 1 tablet (50 mg total) by mouth at bedtime. (Patient not taking: Reported on 06/01/2017) 30 tablet 0 Not Taking at Unknown time    Treatment Modalities: Medication Management, Group therapy, Case management,  1 to 1 session with clinician, Psychoeducation, Recreational therapy.  Patient Stressors: Health problems Medication change or noncompliance  Patient Strengths: Ability for insight Capable of independent living General fund of knowledge   Physician Treatment Plan for Primary Diagnosis: Schizophrenia (HCC) Long Term Goal(s):  Improvement in symptoms so as ready for discharge  Short Term Goals: Ability to identify and develop effective coping behaviors will improve Ability to identify changes in lifestyle to reduce recurrence of condition will improve  Medication Management: Evaluate patient's response, side effects, and tolerance of medication regimen.  Therapeutic Interventions: 1 to 1 sessions, Unit Group sessions and Medication administration.  Evaluation of Outcomes: Progressing  Physician Treatment Plan for Secondary Diagnosis: Principal Problem:   Schizophrenia (HCC)  Long Term Goal(s): Improvement in symptoms so as ready for discharge  Short Term Goals: Ability to identify and develop effective coping behaviors will improve Ability to identify changes in lifestyle to reduce recurrence of condition will improve  Medication Management: Evaluate patient's response, side effects, and tolerance of medication regimen.  Therapeutic Interventions: 1 to 1 sessions, Unit Group sessions and Medication administration.  Evaluation of Outcomes: Progressing   RN Treatment Plan for Primary Diagnosis: Schizophrenia (HCC) Long Term Goal(s): Knowledge of disease and therapeutic regimen to maintain health will improve  Short Term Goals: Ability to participate in decision making will improve, Ability to verbalize feelings will improve, Ability to identify and develop effective coping behaviors will improve and Compliance with prescribed medications will improve  Medication Management: RN will administer medications as ordered by provider, will assess and evaluate patient's response and provide education to patient for prescribed medication. RN will report any adverse and/or side effects to prescribing provider.  Therapeutic Interventions: 1 on 1 counseling sessions, Psychoeducation, Medication administration, Evaluate responses to treatment, Monitor  vital signs and CBGs as ordered, Perform/monitor CIWA, COWS, AIMS and  Fall Risk screenings as ordered, Perform wound care treatments as ordered.  Evaluation of Outcomes: Progressing   LCSW Treatment Plan for Primary Diagnosis: Schizophrenia (HCC) Long Term Goal(s): Safe transition to appropriate next level of care at discharge, Engage patient in therapeutic group addressing interpersonal concerns.  Short Term Goals: Engage patient in aftercare planning with referrals and resources, Increase social support, Facilitate acceptance of mental health diagnosis and concerns, Identify triggers associated with mental health/substance abuse issues and Increase skills for wellness and recovery  Therapeutic Interventions: Assess for all discharge needs, 1 to 1 time with Social worker, Explore available resources and support systems, Assess for adequacy in community support network, Educate family and significant other(s) on suicide prevention, Complete Psychosocial Assessment, Interpersonal group therapy.  Evaluation of Outcomes: Progressing   Progress in Treatment: Attending groups: Yes Participating in groups: Yes Taking medication as prescribed: Yes Toleration of medication: Yes, no side effects reported at this time Family/Significant other contact made: Yes, Lenise Heralderry Ontiveros [phone] Patient understands diagnosis: No, limited insight  Discussing patient identified problems/goals with staff: Yes Medical problems stabilized or resolved: Yes Denies suicidal/homicidal ideation: Yes Issues/concerns per patient self-inventory: None Other: N/A  New problem(s) identified: None identified at this time.   New Short Term/Long Term Goal(s): "To get better, live a normal life, work hard, and be an Solicitorupstanding citizen".   Discharge Plan or Barriers:  Upon discharge pt will return home with his father and follow up with [facility].   Reason for Continuation of Hospitalization: Anxiety Delusions  Medication stabilization   Estimated Length of Stay:  06/10/17  Attendees: Patient: Gilbert GrahamJeremy Meyers 06/05/2017  10:26 AM  Physician: Jolyne Loahristopher Rainville, MD 06/05/2017  10:26 AM  Nursing: Estella HuskElizabeth Awofabeju, RN 06/05/2017  10:26 AM  RN Care Manager: Onnie BoerJennifer Clark, RN 06/05/2017  10:26 AM  Social Worker: Richelle Itood North, LCSW; Melba CoonAngel Jaeshawn Silvio, Social Work Intern 06/05/2017  10:26 AM  Recreational Therapist: Caroll RancherMarjette Lindsay, LRT 06/05/2017  10:26 AM  Other: Tomasita Morrowelora Sutton, P4CC 06/05/2017  10:26 AM  Other:  06/05/2017  10:26 AM  Other: 06/05/2017  10:26 AM    Scribe for Treatment Team: Aram BeechamAngel M Anitria Andon, Student-Social Work 06/05/2017 10:26 AM

## 2017-06-05 NOTE — Progress Notes (Addendum)
Recreation Therapy Notes  Date: 4.3.19 Time: 10:00 am Location: [address] Dayroom   Group Topic: Coping Skills   Goal Area(s) Addresses:  Goal 1.1: To improve coping skills  . Group will answer at least five questions on coping skills  . Group will increase awareness on coping skills  . Group will be able to identify how coping skills can improve their wellness  Intervention: Craft   Activity: Coping Skills Jeopardy Board- Patients had the opportunity to play jeopardy using darts to pop the balloons to answer specific questions based on the category they chose. The patient who had the most points at the end won.   Education: PharmacologistCoping skills, Self-Esteem   Education Outcome: Acknowledges Education  Clinical Observations/Feedback: Patient did not attend   Sheryle HailDarian Arkeem Harts, Recreation Therapy Intern   Sheryle HailDarian Maleke Feria 06/05/2017 11:55 AM

## 2017-06-05 NOTE — Progress Notes (Signed)
Patient denies SI, HI and AVH.  Patient has been attending groups, compliant with medications and engage in other unit activities.   Assess patient for safety, offer medications as prescribed, engage patient in 1:1 staff talks.   Continue to monitor as planned.  Patient able to contract for safety.   

## 2017-06-05 NOTE — Progress Notes (Signed)
Adult Psychoeducational Group Note  Date:  06/05/2017 Time:  9:06 PM  Group Topic/Focus:  Wrap-Up Group:   The focus of this group is to help patients review their daily goal of treatment and discuss progress on daily workbooks.  Participation Level:  Active  Participation Quality:  Appropriate  Affect:  Appropriate  Cognitive:  Appropriate  Insight: Appropriate  Engagement in Group:  Engaged  Modes of Intervention:  Discussion  Additional Comments:  The patient expressed that he rates today a 10 and he attended groups  Octavio Mannshigpen, Cayleigh Paull Lee 06/05/2017, 9:06 PM

## 2017-06-06 LAB — PROLACTIN: PROLACTIN: 53 ng/mL — AB (ref 4.0–15.2)

## 2017-06-06 NOTE — Progress Notes (Signed)
Patient denies SI, HI and AVH.  Patient has been attending groups, compliant with medications and engage in other unit activities.   Assess patient for safety, offer medications as prescribed, engage patient in 1:1 staff talks.   Continue to monitor as planned.  Patient able to contract for safety.   

## 2017-06-06 NOTE — B[facility] Group Notes (Signed)
B[facility] LCSW Group Therapy 06/06/2017 1:15pm  Type of Therapy: Group Therapy- Feelings Around Discharge & Establishing a Supportive Framework  Participation Level:  Active  Description of Group:   What is a supportive framework? What does it look like feel like and how do I discern it from and unhealthy non-supportive network? Learn how to cope when supports are not helpful and don't support you. Discuss what to do when your family/friends are not supportive.  Summary of Patient Progress  Riki RuskJeremy came to group and remained there the entire time.  He shared that his tool for recovery was getting a plan together to get better and start building a support system.  He also shared that if money was no object he would take a train to [place].   Therapeutic Modalities:   Cognitive Behavioral Therapy Person-Centered Therapy Motivational Interviewing   Aram Beechamngel M Hever Castilleja, Student-Social Work 06/06/2017 2:41 PM

## 2017-06-06 NOTE — Progress Notes (Signed)
Adult Psychoeducational Group Note  Date:  06/06/2017 Time:  8:38 PM  Group Topic/Focus:  Wrap-Up Group:   The focus of this group is to help patients review their daily goal of treatment and discuss progress on daily workbooks.  Participation Level:  Active  Participation Quality:  Appropriate  Affect:  Appropriate  Cognitive:  Alert  Insight: Appropriate  Engagement in Group:  Engaged  Modes of Intervention:  Discussion  Additional Comments:  Patient stated having an alright day. Patient's goal for today was to get well. Patient stated he attend all groups today.   Cotina Freedman L Javani Spratt 06/06/2017, 8:38 PM

## 2017-06-06 NOTE — Progress Notes (Signed)
[facility] MD Progress Note  06/06/2017 3:41 PM Prateek Knipple  MRN:  [id number] Subjective:    Gilbert Meyers is a 32 y/o M with history of schizophrenia who was admitted voluntarily from [facility] ED where he was brought voluntarily with worsening symptoms of psychosis including AH, poor intake of food and water for the past 5 weeks, anxiety, and somatic delusions regarding his jaw, throat, and arm. Pt had also reported use of cannabis about once per month prior to admission. Pt had been changed about 5 weeks ago from Navane to risperdal 0.25mg  po qDay, and pt reports he has been maintaining good adherence to that dose prior to admission. Pt was restarted on risperdal at higher dose of 1mg  po BID upon arrival to [facility]. He had improvement of his poor intake of food and water during his admission, and he has reported incremental improvement of his presenting symptoms.  Today upon evaluation, pt shares, "I feel like I'm getting better. I'm still having a little trouble eating, but I'm feeling better." Pt and RN staff indicate that pt is having adequate intake of nutrition and hydration. He is sleeping well. His appetite is good. He denies SI/HI/AH/VH. He feels less fatigued today, and he is tolerating his current medication regimen well. He is in agreement to continue his current treatment plan without changes today. He had no further questions, comments, or concerns.  Principal Problem: Schizophrenia (HCC) Diagnosis:   Patient Active Problem List   Diagnosis Date Noted  . Schizophrenia (HCC) [F20.9] 06/03/2017  . Hypertension [I10] 02/19/2017  . 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 Anxiety   . Bipolar 1 disorder (HCC)   . Schizophrenia (HCC)    History reviewed. No pertinent surgical history. Family History:  Family History  Problem  Relation Age of Onset  . Prostate cancer Paternal Grandfather   . Colon cancer Neg Hx    Family Psychiatric  History: see H&P Social History:  Social History   Substance and Sexual Activity  Alcohol Use No   Comment: former     Social History   Substance and Sexual Activity  Drug Use Yes  . Types: Marijuana    Social History   Socioeconomic History  . Marital status: Single    Spouse name: Not on file  . Number of children: 0  . Years of education: Not on file  . Highest education level: Not on file  Occupational History  . Occupation: Unemployed   Social Needs  . Financial resource strain: Not on file  . Food insecurity:    Worry: Not on file    Inability: Not on file  . Transportation needs:    Medical: Not on file    Non-medical: Not on file  Tobacco Use  . Smoking status: Current Some Day Smoker    Packs/day: 0.10    Types: E-cigarettes  . Smokeless tobacco: Never Used  Substance and Sexual Activity  . Alcohol use: No    Comment: former  . Drug use: Yes    Types: Marijuana  . Sexual activity: Not Currently  Lifestyle  . Physical activity:    Days per week: Not on file    Minutes per session: Not on file  . Stress: Not on file  Relationships  . Social connections:    Talks on phone: Not on file  Gets together: Not on file    Attends religious service: Not on file    Active member of club or organization: Not on file    Attends meetings of clubs or organizations: Not on file    Relationship status: Not on file  Other Topics Concern  . Not on file  Social History Narrative  . Not on file   Additional Social History:                         Sleep: Good  Appetite:  Good  Current Medications: Current Facility-Administered Medications  Medication Dose Route Frequency Provider Last Rate Last Dose  . acetaminophen (TYLENOL) tablet 650 mg  650 mg Oral Q6H PRN Fransisca Kaufmannavis, Laura A, NP      . hydrOXYzine (ATARAX/VISTARIL) tablet 25 mg  25 mg  Oral Q6H PRN Fransisca Kaufmannavis, Laura A, NP   25 mg at 06/06/17 1052  . nicotine polacrilex (NICORETTE) gum 2 mg  2 mg Oral PRN Kerry HoughSimon, Spencer E, PA-C      . pneumococcal 23 valent vaccine (PNU-IMMUNE) injection 0.5 mL  0.5 mL Intramuscular Tomorrow-1000 Simon, Spencer E, PA-C      . risperiDONE (RISPERDAL) tablet 1 mg  1 mg Oral BID Micheal Likensainville, Laniece Hornbaker T, MD   1 mg at 06/06/17 1050  . traZODone (DESYREL) tablet 50 mg  50 mg Oral QHS PRN Micheal Likensainville, Camara Rosander T, MD   50 mg at 06/05/17 2114    Lab Results:  Results for orders placed or performed during the hospital encounter of 06/03/17 (from the past 48 hour(s))  Prolactin     Status: Abnormal   Collection Time: 06/05/17  6:36 AM  Result Value Ref Range   Prolactin 53.0 (H) 4.0 - 15.2 ng/mL    Comment: (NOTE) Performed At: [facility] [address] [place], [place] [id number] Jolene SchimkeNagendra Sanjai MD WU:[id number]:[phone] Performed at [facility], [address] [place], [place] [address]   Lipid panel     Status: Abnormal   Collection Time: 06/05/17  6:36 AM  Result Value Ref Range   Cholesterol 142 0 - 200 mg/dL   Triglycerides 308163 (H) <150 mg/dL   HDL 35 (L) >65>40 mg/dL   Total CHOL/HDL Ratio 4.1 RATIO   VLDL 33 0 - 40 mg/dL   LDL Cholesterol 74 0 - 99 mg/dL    Comment:        Total Cholesterol/HDL:CHD Risk Coronary Heart Disease Risk Table                     Men   Women  1/2 Average Risk   3.4   3.3  Average Risk       5.0   4.4  2 X Average Risk   9.6   7.1  3 X Average Risk  23.4   11.0        Use the calculated Patient Ratio above and the CHD Risk Table to determine the patient's CHD Risk.        ATP III CLASSIFICATION (LDL):  <100     mg/dL   Optimal  [phone]  mg/dL   Near or Above                    Optimal  130-159  mg/dL   Borderline  [id number]  mg/dL   High  >132>190     mg/dL   Very High Performed at [facility], [address] [place], [place] [address]   Hemoglobin A1c      Status: None   Collection Time: 06/05/17  6:36 AM  Result Value Ref Range   Hgb A1c MFr Bld 5.4 4.8 - 5.6 %    Comment: (NOTE) Pre diabetes:          5.7%-6.4% Diabetes:              >6.4% Glycemic control for   <7.0% adults with diabetes    Mean Plasma Glucose 108.28 mg/dL    Comment: Performed at [facility], [address], [place], [place] [address]  TSH     Status: Abnormal   Collection Time: 06/05/17  6:36 AM  Result Value Ref Range   TSH 8.963 (H) 0.350 - 4.500 uIU/mL    Comment: Performed by a 3rd Generation assay with a functional sensitivity of <=0.01 uIU/mL. Performed at [facility], [address], [place], [place] [address]     Blood Alcohol level:  Lab Results  Component Value Date   ETH <10 06/01/2017   ETH <10 05/09/2017    Metabolic Disorder Labs: Lab Results  Component Value Date   HGBA1C 5.4 06/05/2017   MPG 108.28 06/05/2017   Lab Results  Component Value Date   PROLACTIN 53.0 (H) 06/05/2017   PROLACTIN 22.9 (H) 03/25/2015   Lab Results  Component Value Date   CHOL 142 06/05/2017   TRIG 163 (H) 06/05/2017   HDL 35 (L) 06/05/2017   CHOLHDL 4.1 06/05/2017   VLDL 33 06/05/2017   LDLCALC 74 06/05/2017   LDLCALC 136 (H) 03/25/2015    Physical Findings: AIMS:  , ,  ,  ,    CIWA:    COWS:     Musculoskeletal: Strength & Muscle Tone: within normal limits Gait & Station: normal Patient leans: N/A  Psychiatric Specialty Exam: Physical Exam  Nursing note and vitals reviewed.   Review of Systems  Constitutional: Negative for chills and fever.  Respiratory: Negative for cough and shortness of breath.   Cardiovascular: Negative for chest pain.  Gastrointestinal: Negative for abdominal pain, heartburn, nausea and vomiting.  Psychiatric/Behavioral: Negative for depression, hallucinations and suicidal ideas. The patient is not nervous/anxious.     Blood pressure 101/70, pulse (!) 101, temperature (!) 97.4 F (36.3  C), resp. rate 16, height 5\' 10"  (1.778 m), weight 92.1 kg (203 lb), SpO2 100 %.Body mass index is 29.13 kg/m.  General Appearance: Casual and Fairly Groomed  Eye Contact:  Good  Speech:  Clear and Coherent and Normal Rate  Volume:  Normal  Mood:  Euthymic  Affect:  Appropriate, Congruent and Flat  Thought Process:  Coherent and Goal Directed  Orientation:  Full (Time, Place, and Person)  Thought Content:  Delusions  Suicidal Thoughts:  No  Homicidal Thoughts:  No  Memory:  Immediate;   Fair Recent;   Fair Remote;   Fair  Judgement:  Fair  Insight:  Fair  Psychomotor Activity:  Normal  Concentration:  Concentration: Fair  Recall:  [employer] of Knowledge:  Fair  Language:  Fair  Akathisia:  No  Handed:    AIMS (if indicated):     Assets:  Communication Skills Resilience Social Support  ADL's:  Intact  Cognition:  WNL  Sleep:  Number of Hours: 6.75   Treatment Plan Summary: Daily contact with patient to assess and evaluate symptoms and progress in treatment and Medication management   -Continue inpatient hospitalization  -  Schizophrenia             - Continue risperdal 1mg  po BID  -Anxiety             -Continue atarax 25mg  po q6h prn anxiety  - Insomnia              -Continue trazodone 50mg  po qhs  -Encourage participation in groups and therapeutic milieu  -Disposition planning will be ongoing  Micheal Likens, MD 06/06/2017, 3:41 PM

## 2017-06-06 NOTE — Progress Notes (Signed)
  DATA ACTION RESPONSE  Objective- Pt. is visible in the dayroom, seen watching TV and interacting with peers.Presents with an animated/axnious affect and mood. Pt states he gets anxious and could not identified a trigger. Pt states "I can't seem to relax". C/o of anxiety and insomnia this evening.  Subjective- Denies having any SI/HI/AVH/Pain at this time.Is cooperative and remains safe on the unit.  1:1 interaction in private to establish rapport. Encouragement, education, & support given from staff.  PRN vistaril and trazodone  requested and will re-eval accordingly.   Safety maintained with Q 15 checks. Continue with POC.

## 2017-06-06 NOTE — Progress Notes (Signed)
Recreation Therapy Notes  Date: 4.4.19 Time: 10:00 a.m Location: [address] Dayroom   Group Topic: Triggers   Goal Area(s) Addresses:  Goal 1.1: To identify triggers and coping skills  - Group will identify at least four triggers   - Group will identify at least eight coping skills   - Group will participate in Recreation Therapy group tx.   Intervention: Craft    Activity: Coping Strategies Fortune Teller: Patients received a Clinical biochemistCoping Strategies Fortune Teller. Patients had to identify four triggers and eight coping skills for anger. Patients shared their triggers and coping skills with peers    Education: Triggers, Coping Skills   Education Outcome: Acknowledges Education  Clinical Observations/Feedback: Patient did not attend   Sheryle HailDarian Tameisha Covell, Recreation Therapy Intern   Sheryle HailDarian Barack Nicodemus 06/06/2017 11:31 AM

## 2017-06-07 NOTE — Progress Notes (Signed)
Patient denies SI, HI and AVH.  Patient has been attending groups, compliant with medications and engage in other unit activities.   Assess patient for safety, offer medications as prescribed, engage patient in 1:1 staff talks.   Continue to monitor as planned.  Patient able to contract for safety.   

## 2017-06-07 NOTE — Progress Notes (Signed)
Recreation Therapy Notes  Date: 4.5.19 Time: 10:00 a.m.  Location: [address] Dayroom   Group Topic: Self-Expression   Goal Area(s) Addresses:  -Patient will participate in group discussions  -Patient will express their thoughts/feelings through art  -Patient will report feeling calm after group tx.   Intervention: Art   Activity: Patients had the opportunity to express their thoughts or feelings through the use of art. Patients painted a canvas using the paint provided.   Education: Self-Expression, Software engineerCoping Skill   Education Outcome:Acknowledges Education  Clinical Observations/Feedback: Patient did not attend    Sheryle HailDarian Timathy Newberry, Recreation Therapy Intern   Sheryle HailDarian Constantine Ruddick 06/07/2017 11:40 AM

## 2017-06-07 NOTE — Progress Notes (Signed)
[facility] MD Progress Note  06/07/2017 4:08 PM Gilbert Meyers  MRN:  [id number] Subjective:    Gilbert Meyers is a 32 y/o M with history of schizophrenia who was admitted voluntarily from [facility] ED where he was brought voluntarily with worsening symptoms of psychosis including AH, poor intake of food and water for the past 5 weeks, anxiety, and somatic delusions regarding his jaw, throat, and arm. Pt had also reported use of cannabis about once per month prior to admission. Pt had been changed about 5 weeks ago from Navane to risperdal 0.25mg  po qDay, and pt reports he has been maintaining good adherence to that dose prior to admission.Pt was restarted on risperdal at higher dose of 1mg  po BID upon arrival to [facility]. He had improvement of his poor intake of food and water during his admission, and he has reported incremental improvement of his presenting symptoms.  Today upon evaluation, pt shares, "I'm good. I'm feeling a lot better." Pt shares that his anxiety is much improved and his symptoms of AH have almost completely resolved. He notes that he has intensity of AH of "1/10" today as compared to "10/10" when he first arrived. He denies physical complaints. He is eating well, and he notes anxiety regarding eating is significantly improved. He denies SI/HI/VH. He is sleeping well. He is in agreement to continue his current treatment regimen without changes.  Principal Problem: Schizophrenia (HCC) Diagnosis:   Patient Active Problem List   Diagnosis Date Noted  . Schizophrenia (HCC) [F20.9] 06/03/2017  . Hypertension [I10] 02/19/2017  . 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 Anxiety   . Bipolar 1 disorder (HCC)   . Schizophrenia (HCC)    History reviewed. No pertinent surgical history. Family History:  Family History  Problem  Relation Age of Onset  . Prostate cancer Paternal Grandfather   . Colon cancer Neg Hx    Family Psychiatric  History: see H&p Social History:  Social History   Substance and Sexual Activity  Alcohol Use No   Comment: former     Social History   Substance and Sexual Activity  Drug Use Yes  . Types: Marijuana    Social History   Socioeconomic History  . Marital status: Single    Spouse name: Not on file  . Number of children: 0  . Years of education: Not on file  . Highest education level: Not on file  Occupational History  . Occupation: Unemployed   Social Needs  . Financial resource strain: Not on file  . Food insecurity:    Worry: Not on file    Inability: Not on file  . Transportation needs:    Medical: Not on file    Non-medical: Not on file  Tobacco Use  . Smoking status: Current Some Day Smoker    Packs/day: 0.10    Types: E-cigarettes  . Smokeless tobacco: Never Used  Substance and Sexual Activity  . Alcohol use: No    Comment: former  . Drug use: Yes    Types: Marijuana  . Sexual activity: Not Currently  Lifestyle  . Physical activity:    Days per week: Not on file    Minutes per session: Not on file  . Stress: Not on file  Relationships  . Social connections:    Talks on phone: Not on file  Gets together: Not on file    Attends religious service: Not on file    Active member of club or organization: Not on file    Attends meetings of clubs or organizations: Not on file    Relationship status: Not on file  Other Topics Concern  . Not on file  Social History Narrative  . Not on file   Additional Social History:                         Sleep: Good  Appetite:  Good  Current Medications: Current Facility-Administered Medications  Medication Dose Route Frequency Provider Last Rate Last Dose  . acetaminophen (TYLENOL) tablet 650 mg  650 mg Oral Q6H PRN Fransisca Kaufmannavis, Laura A, NP      . hydrOXYzine (ATARAX/VISTARIL) tablet 25 mg  25 mg  Oral Q6H PRN Thermon Leylandavis, Laura A, NP   25 mg at 06/07/17 1314  . nicotine polacrilex (NICORETTE) gum 2 mg  2 mg Oral PRN Kerry HoughSimon, Spencer E, PA-C      . pneumococcal 23 valent vaccine (PNU-IMMUNE) injection 0.5 mL  0.5 mL Intramuscular Tomorrow-1000 Simon, Spencer E, PA-C      . risperiDONE (RISPERDAL) tablet 1 mg  1 mg Oral BID Micheal Likensainville, Leanny Moeckel T, MD   1 mg at 06/07/17 1314  . traZODone (DESYREL) tablet 50 mg  50 mg Oral QHS PRN Micheal Likensainville, Nyonna Hargrove T, MD   50 mg at 06/06/17 2103    Lab Results: No results found for this or any previous visit (from the past 48 hour(s)).  Blood Alcohol level:  Lab Results  Component Value Date   ETH <10 06/01/2017   ETH <10 05/09/2017    Metabolic Disorder Labs: Lab Results  Component Value Date   HGBA1C 5.4 06/05/2017   MPG 108.28 06/05/2017   Lab Results  Component Value Date   PROLACTIN 53.0 (H) 06/05/2017   PROLACTIN 22.9 (H) 03/25/2015   Lab Results  Component Value Date   CHOL 142 06/05/2017   TRIG 163 (H) 06/05/2017   HDL 35 (L) 06/05/2017   CHOLHDL 4.1 06/05/2017   VLDL 33 06/05/2017   LDLCALC 74 06/05/2017   LDLCALC 136 (H) 03/25/2015    Physical Findings: AIMS:  , ,  ,  ,    CIWA:    COWS:     Musculoskeletal: Strength & Muscle Tone: within normal limits Gait & Station: normal Patient leans: N/A  Psychiatric Specialty Exam: Physical Exam  Nursing note and vitals reviewed.   Review of Systems  Constitutional: Negative for chills and fever.  Respiratory: Negative for cough and shortness of breath.   Cardiovascular: Negative for chest pain.  Gastrointestinal: Negative for abdominal pain, heartburn, nausea and vomiting.  Psychiatric/Behavioral: Positive for hallucinations. Negative for depression and suicidal ideas. The patient is nervous/anxious. The patient does not have insomnia.     Blood pressure 97/63, pulse (!) 107, temperature 97.8 F (36.6 C), temperature source Oral, resp. rate 16, height 5\' 10"  (1.778 m),  weight 92.1 kg (203 lb), SpO2 100 %.Body mass index is 29.13 kg/m.  General Appearance: Casual and Fairly Groomed  Eye Contact:  Good  Speech:  Clear and Coherent and Normal Rate  Volume:  Normal  Mood:  Euthymic  Affect:  Congruent and Flat  Thought Process:  Coherent and Goal Directed  Orientation:  Full (Time, Place, and Person)  Thought Content:  Hallucinations: Auditory  Suicidal Thoughts:  No  Homicidal Thoughts:  No  Memory:  Immediate;   Fair Recent;   Fair Remote;   Fair  Judgement:  Fair  Insight:  Fair  Psychomotor Activity:  Normal  Concentration:  Concentration: Fair  Recall:  [employer] of Knowledge:  Fair  Language:  Fair  Akathisia:  No  Handed:    AIMS (if indicated):     Assets:  Communication Skills Resilience Social Support  ADL's:  Intact  Cognition:  WNL  Sleep:  Number of Hours: 6.75   Treatment Plan Summary: Daily contact with patient to assess and evaluate symptoms and progress in treatment and Medication management   -Continue inpatient hospitalization  - Schizophrenia - Continue risperdal 1mg  po BID  -Anxiety -Continue atarax 25mg  po q6h prn anxiety  - Insomnia -Continue trazodone 50mg  po qhs  -Encourage participation in groups and therapeutic milieu  -Disposition planning will be ongoing  Micheal Likens, MD 06/07/2017, 4:08 PM

## 2017-06-07 NOTE — [facility] Group Notes (Signed)
[facility] LCSW Group Therapy  06/07/2017  1:05 PM  Type of Therapy:  Group therapy  Participation Level:  Active  Participation Quality:  Attentive  Affect:  Flat  Cognitive:  Oriented  Insight:  Limited  Engagement in Therapy:  Limited  Modes of Intervention:  Discussion, Socialization  Summary of Progress/Problems:  Chaplain was here to lead a group on themes of hope and courage. Riki RuskJeremy was engaged with the topic. He reflected that hope was finding peace in his life. He defined this by learning to cope with his anxiety and managing his eating disorder. He stated that he was also hopeful about  looking for work now that he has stabilized during this hospitalization. Clinician discussed options regarding maintaining his wellness through work and/or filing for disability.  Ida Rogueorth, Nahara Dona B 06/07/2017 2:37 PM

## 2017-06-07 NOTE — Progress Notes (Signed)
Pt refused to go to breakfast this a.m. provided with encouragement. 

## 2017-06-08 DIAGNOSIS — F17290 Nicotine dependence, other tobacco product, uncomplicated: Secondary | ICD-10-CM

## 2017-06-08 DIAGNOSIS — G4700 Insomnia, unspecified: Secondary | ICD-10-CM

## 2017-06-08 DIAGNOSIS — F1290 Cannabis use, unspecified, uncomplicated: Secondary | ICD-10-CM

## 2017-06-08 DIAGNOSIS — Z560 Unemployment, unspecified: Secondary | ICD-10-CM

## 2017-06-08 MED ORDER — RISPERIDONE 0.5 MG PO TABS
0.5000 mg | ORAL_TABLET | Freq: Every day | ORAL | Status: DC
  Administered 2017-06-09 – 2017-06-10 (×2): 0.5 mg via ORAL
  Filled 2017-06-08: qty 21
  Filled 2017-06-08 (×3): qty 1

## 2017-06-08 MED ORDER — RISPERIDONE 1 MG PO TABS: 1.0000 mg | ORAL_TABLET | Freq: Every day | ORAL | Status: DC

## 2017-06-08 MED ORDER — RISPERIDONE 1 MG PO TABS
1.0000 mg | ORAL_TABLET | Freq: Every day | ORAL | Status: DC
  Administered 2017-06-08 – 2017-06-09 (×2): 1 mg via ORAL
  Filled 2017-06-08 (×3): qty 1

## 2017-06-08 MED ORDER — RISPERIDONE 0.5 MG PO TABS: 0.5000 mg | ORAL_TABLET | Freq: Every day | ORAL | Status: DC

## 2017-06-08 MED ORDER — BENZTROPINE MESYLATE 0.5 MG PO TABS
0.5000 mg | ORAL_TABLET | Freq: Two times a day (BID) | ORAL | Status: DC
  Administered 2017-06-08 – 2017-06-10 (×4): 0.5 mg via ORAL
  Filled 2017-06-08 (×7): qty 1

## 2017-06-08 NOTE — Progress Notes (Signed)
Nursing Progress Note [phone]  Data: Patient presents with anxious mood but is observed up in the dayroom. Patient took morning medications without incident. Patient states goal for today is to "get healthy" and "stay positive". Patient denies SI/HI/AVH or pain. Patient contracts for safety on the unit at this time. Patient completed self-inventory sheet and rate depression, hopelessness, and anxiety 4,3,2 respectively. Patient denies concerns at this time.  Action: Patient educated about and provided medication per provider's orders. Patient safety maintained with q15 min safety checks. Low fall risk precautions in place. Emotional support given. 1:1 interaction and active listening provided. Patient encouraged to attend meals and groups. Labs, vital signs and patient behavior monitored throughout shift. Patient encouraged to work on treatment plan and goals.  Response: Patient remains safe on the unit at this time. Patient is interacting with peers appropriately on the unit. Will continue to support and monitor.

## 2017-06-08 NOTE — Progress Notes (Addendum)
[facility] MD Progress Note  06/08/2017 2:57 PM Gilbert Meyers  MRN:  [id number]   Subjective: Patient seen resting in day room interacting with peers and staff.  Patient has concerns with tiredness and fatigue due to medication.  Patient is requesting for medications to be adjusted.  Discussed decreasing Risperdal 1 mg p.o. daily to 0.5 mg patient is agreeable to treatment plan.  Reports his appetite has been improving since his admission.  Patient denied any paranoia or delusions.  Denies any homicidal or suicidal.  Denies visual hallucinations. Patient auditory hallucination as decreased. Reports he is resting well during the day and at night. Patient is inquiring about discharge. Patient reports he has been attending group sessions since his admission.  Support and encouragement and reassurance was provided.  History: per assessment note Gilbert Meyers is a 32 y/o M with history of schizophrenia who was admitted voluntarily from [facility] ED where he was brought voluntarily with worsening symptoms of psychosis including AH, poor intake of food and water for the past 5 weeks, anxiety, and somatic delusions regarding his jaw, throat, and arm. Pt had also reported use of cannabis about once per month prior to admission. Pt had been changed about 5 weeks ago from Navane to risperdal 0.25mg  po qDay, and pt reports he has been maintaining good adherence to that dose prior to admission.Pt was restarted on risperdal at higher dose of 1mg  po BID upon arrival to [facility]. He had improvement of his poor intake of food and water during his admission, and he has reported incremental improvement of his presenting symptoms.   Principal Problem: Schizophrenia (HCC) Diagnosis:   Patient Active Problem List   Diagnosis Date Noted  . Schizophrenia (HCC) [F20.9] 06/03/2017  . Hypertension [I10] 02/19/2017  . Undifferentiated schizophrenia (HCC) [F20.3] 03/25/2015   Total Time spent with patient: 30 minutes  Past Psychiatric  History: see H&P  Past Medical History:  Past Medical History:  Diagnosis Date  . Anxiety   . Bipolar 1 disorder (HCC)   . Schizophrenia (HCC)    History reviewed. No pertinent surgical history. Family History:  Family History  Problem Relation Age of Onset  . Prostate cancer Paternal Grandfather   . Colon cancer Neg Hx    Family Psychiatric  History: see H&p Social History:  Social History   Substance and Sexual Activity  Alcohol Use No   Comment: former     Social History   Substance and Sexual Activity  Drug Use Yes  . Types: Marijuana    Social History   Socioeconomic History  . Marital status: Single    Spouse name: Not on file  . Number of children: 0  . Years of education: Not on file  . Highest education level: Not on file  Occupational History  . Occupation: Unemployed   Social Needs  . Financial resource strain: Not on file  . Food insecurity:    Worry: Not on file    Inability: Not on file  . Transportation needs:    Medical: Not on file    Non-medical: Not on file  Tobacco Use  . Smoking status: Current Some Day Smoker    Packs/day: 0.10    Types: E-cigarettes  . Smokeless tobacco: Never Used  Substance and Sexual Activity  . Alcohol use: No    Comment: former  . Drug use: Yes    Types: Marijuana  . Sexual activity: Not Currently  Lifestyle  . Physical activity:    Days per week:  Not on file    Minutes per session: Not on file  . Stress: Not on file  Relationships  . Social connections:    Talks on phone: Not on file    Gets together: Not on file    Attends religious service: Not on file    Active member of club or organization: Not on file    Attends meetings of clubs or organizations: Not on file    Relationship status: Not on file  Other Topics Concern  . Not on file  Social History Narrative  . Not on file   Additional Social History:                         Sleep: Good  Appetite:  Good  Current  Medications: Current Facility-Administered Medications  Medication Dose Route Frequency Provider Last Rate Last Dose  . acetaminophen (TYLENOL) tablet 650 mg  650 mg Oral Q6H PRN Fransisca Kaufmannavis, Laura A, NP      . hydrOXYzine (ATARAX/VISTARIL) tablet 25 mg  25 mg Oral Q6H PRN Thermon Leylandavis, Laura A, NP   25 mg at 06/07/17 2104  . nicotine polacrilex (NICORETTE) gum 2 mg  2 mg Oral PRN Kerry HoughSimon, Spencer E, PA-C      . pneumococcal 23 valent vaccine (PNU-IMMUNE) injection 0.5 mL  0.5 mL Intramuscular Tomorrow-1000 Simon, Spencer E, PA-C      . risperiDONE (RISPERDAL) tablet 0.5 mg  0.5 mg Oral Daily Lewis, Tanika N, NP       And  . risperiDONE (RISPERDAL) tablet 1 mg  1 mg Oral QHS Lewis, Jerene Pitchanika N, NP      . traZODone (DESYREL) tablet 50 mg  50 mg Oral QHS PRN Micheal Likensainville, Christopher T, MD   50 mg at 06/07/17 2104    Lab Results: No results found for this or any previous visit (from the past 48 hour(s)).  Blood Alcohol level:  Lab Results  Component Value Date   ETH <10 06/01/2017   ETH <10 05/09/2017    Metabolic Disorder Labs: Lab Results  Component Value Date   HGBA1C 5.4 06/05/2017   MPG 108.28 06/05/2017   Lab Results  Component Value Date   PROLACTIN 53.0 (H) 06/05/2017   PROLACTIN 22.9 (H) 03/25/2015   Lab Results  Component Value Date   CHOL 142 06/05/2017   TRIG 163 (H) 06/05/2017   HDL 35 (L) 06/05/2017   CHOLHDL 4.1 06/05/2017   VLDL 33 06/05/2017   LDLCALC 74 06/05/2017   LDLCALC 136 (H) 03/25/2015    Physical Findings: AIMS: Facial and Oral Movements Muscles of Facial Expression: None, normal Lips and Perioral Area: None, normal Jaw: None, normal Tongue: None, normal,Extremity Movements Upper (arms, wrists, hands, fingers): None, normal Lower (legs, knees, ankles, toes): None, normal, Trunk Movements Neck, shoulders, hips: None, normal, Overall Severity Severity of abnormal movements (highest score from questions above): None, normal Incapacitation due to abnormal  movements: None, normal Patient's awareness of abnormal movements (rate only patient's report): No Awareness, Dental Status Current problems with teeth and/or dentures?: No Does patient usually wear dentures?: No  CIWA:    COWS:     Musculoskeletal: Strength & Muscle Tone: within normal limits Gait & Station: normal Patient leans: N/A  Psychiatric Specialty Exam: Physical Exam  Nursing note and vitals reviewed. Cardiovascular: Normal rate.  Neurological: He is alert.  Psychiatric: He has a normal mood and affect. His behavior is normal.    Review of Systems  Psychiatric/Behavioral: Positive  for hallucinations. Negative for depression and suicidal ideas. The patient is nervous/anxious. The patient does not have insomnia.   All other systems reviewed and are negative.   Blood pressure 97/63, pulse (!) 107, temperature 97.8 F (36.6 C), temperature source Oral, resp. rate 16, height 5\' 10"  (1.778 m), weight 92.1 kg (203 lb), SpO2 100 %.Body mass index is 29.13 kg/m.  General Appearance: Casual and Fairly Groomed  Eye Contact:  Good  Speech:  Clear and Coherent and Normal Rate  Volume:  Normal  Mood:  Euthymic  Affect:  Congruent and Flat  Thought Process:  Coherent and Goal Directed  Orientation:  Full (Time, Place, and Person)  Thought Content:  Hallucinations: Auditory  Suicidal Thoughts:  No  Homicidal Thoughts:  No  Memory:  Immediate;   Fair Recent;   Fair Remote;   Fair  Judgement:  Fair  Insight:  Fair  Psychomotor Activity:  Normal  Concentration:  Concentration: Fair  Recall:  [employer] of Knowledge:  Fair  Language:  Fair  Akathisia:  No  Handed:    AIMS (if indicated):     Assets:  Communication Skills Resilience Social Support  ADL's:  Intact  Cognition:  WNL  Sleep:  Number of Hours: 6.75   Treatment Plan Summary: Daily contact with patient to assess and evaluate symptoms and progress in treatment and Medication management   Continue treatment  plan on 06/08/2017 as listed below except were noted.  - Schizophrenia -  Decreased Risperdal 1 mg  po BID to 0.5mg  po QD and continue 1 mg QHS. - EPS  -Initiated Cogentin 0.5 mg grams p.o. twice daily -Anxiety -Continue atarax 25mg  po q6h prn anxiety  - Insomnia -Continue trazodone 50mg  po qhs  -Encourage participation in groups and therapeutic milieu -Disposition planning will be ongoing    Oneta Rack, NP 06/08/2017, 2:57 PM   Agree with NP Progress Note

## 2017-06-08 NOTE — Plan of Care (Signed)
  Problem: Safety: Goal: Periods of time without injury will increase Outcome: Progressing   Problem: Activity: Goal: Sleeping patterns will improve Outcome: Not Progressing

## 2017-06-08 NOTE — Progress Notes (Signed)
D: Pt attended breakfast. Pt seen in and out of day room watching TV.  Pt is commplient with medications. Pt denies SI/HI and A/V hallucinations.  Pt appears sad and depressed, expresses that he has been her a while and is ready to go home.  Pt states "I am not fully recovered, but I am on my way there". A: Pt given prescribed medications.  Pt reassured that recovery is a process and that little progress is still good progress.  Pt reminded that he can come to staff for needs. R: 15 minute safety checks.  Pt contracts for safety.  Pt wearing non skid socks.

## 2017-06-08 NOTE — Plan of Care (Signed)
Problem: Safety: Goal: Periods of time without injury will increase Intervention: Patient contracts for safety on the unit. Low fall risk precautions in place. Safety monitored with q15 minute checks. Outcome: Patient remains safe on the unit at this time. 06/08/2017 2:03 PM - Progressing by Ferrel Loganollazo, Zuriah Bordas A, RN

## 2017-06-08 NOTE — Progress Notes (Signed)
Patient ID: Gilbert GrahamJeremy Rosenberg, male   DOB: 03/24/1985, 32 y.o.   MRN: [id number] DAR Note: Pt is visible in the dayroom, seen watching TV and interacting with peers. Pt at the time of assessment endorsed mild anxiety and depression "I feel much better now." Pt denies any SI/HI, pain or AVH. T was med compliant. All Pt's questions and concerns addressed. Will continue to monitor for safety

## 2017-06-08 NOTE — Progress Notes (Signed)
Psychoeducational Group Note  Date:  06/08/2017 Time:  1502  Group Topic/Focus:  Managing Feelings:   The focus of this group is to identify what feelings patients have difficulty handling and develop a plan to handle them in a healthier way upon discharge.  Participation Level: Did Not Attend  Participation Quality:  Not Applicable  Affect:  Not Applicable  Cognitive:  Not Applicable  Insight:  Not Applicable  Engagement in Group: Not Applicable  Additional Comments:  Pt was in group at the beginning but had to leave to go speak to the MD.  Lashun Mccants E 06/08/2017, 3:18 PM

## 2017-06-08 NOTE — Progress Notes (Signed)
D: Pt denies SI/HI/AV hallucinations. Patient complained of insomnia. Patient given Trazodone 50 mg for sleep will monitor for results. Pt goal for today was "keep calm and relax the rest of the day. A: Pt was offered support and encouragement. Pt was given scheduled medications. Pt was encourage to attend groups. Q 15 minute checks were done for safety.  R:Pt attends groups and interacts well with peers and staff. Pt is taking medication. Pt has no complaints.Pt receptive to treatment and safety maintained on unit.

## 2017-06-08 NOTE — Progress Notes (Signed)
Adult Psychoeducational Group Note  Date:  06/08/2017 Time:  4:00 PM  Group Topic/Focus: SMART Goals Goals Group: The focus of this group is to teach patients how to make goals that are specific, measurable, attainable, relative, and timely. Each patient is instructed to practice setting a goal using the SMART tool in order to achieve success during treatment and discuss how the patient can incorporate goal setting into their daily lives to aide in recovery.  Participation Level:  Active  Participation Quality:  Appropriate  Affect:  Appropriate  Cognitive:  Appropriate  Insight: Improving  Engagement in Group:  Developing/Improving  Modes of Intervention:  Discussion, Education, Socialization and Support  Additional Comments: Patient reports his goal is to "keep calm and relax for the rest of the day".  Rae Lipsmanda A Doraine Schexnider 06/08/2017, 5:25 PM

## 2017-06-09 MED ORDER — TRAZODONE HCL 100 MG PO TABS
100.0000 mg | ORAL_TABLET | Freq: Every evening | ORAL | Status: DC | PRN
  Administered 2017-06-09: 100 mg via ORAL
  Filled 2017-06-09: qty 1
  Filled 2017-06-09: qty 7

## 2017-06-09 NOTE — Plan of Care (Signed)
Goal: Ability to demonstrate self-control will improve Intervention: Patient encouraged to utilize coping skills and communicate needs with staff. Outcome: Patient has been free of outbursts this shift. 06/09/2017 10:44 AM - Progressing by Ferrel Loganollazo, Franchon Ketterman A, RN   Problem: Health Behavior/Discharge Planning: Goal: Compliance with treatment plan for underlying cause of condition will improve Intervention: Patient encouraged to take medications as prescribed and attend groups. Patient encouraged to be active in their recovery. Outcome: Patient is attending groups, taking medications as prescribed and complying with goals of treatment. 06/09/2017 10:44 AM - Progressing by Ferrel Loganollazo, Kwali Wrinkle A, RN

## 2017-06-09 NOTE — Progress Notes (Signed)
Nursing Progress Note [phone]  Data: Patient presents with anxious mood but is cooperative with Clinical research associatewriter. Patient took morning medications without incident. Patient denies concerns at this time and states, "I'm just tired". Patient is isolative to his room this morning. Patient states goal for today is to "get healthier" and "be relaxed". Patient denies SI/HI/AVH or pain. Patient contracts for safety on the unit at this time. Patient completed self-inventory sheet and rate depression, hopelessness, and anxiety 4,4,4 respectively.  Action: Patient educated about and provided medication per provider's orders. Patient safety maintained with q15 min safety checks. Low fall risk precautions in place. Emotional support given. 1:1 interaction and active listening provided. Patient encouraged to attend meals and groups. Labs, vital signs and patient behavior monitored throughout shift. Patient encouraged to work on treatment plan and goals.  Response: Patient remains safe on the unit at this time. Will continue to support and monitor.

## 2017-06-09 NOTE — Progress Notes (Signed)
D: Pt denies SI/HI/AVH. Pt is pleasant and cooperative. Pt visible on on the milieu this evening. Pt endorsed increased anxiety.   A: Pt was offered support and encouragement. Pt was given scheduled medications. Pt was encourage to attend groups. Q 15 minute checks were done for safety.   R:Pt attends groups and interacts well with peers and staff. Pt is taking medication. Pt receptive to treatment and safety maintained on unit.

## 2017-06-09 NOTE — Progress Notes (Signed)
Adult Psychoeducational Group Note  Date:  06/09/2017 Time:  1600  Group Topic/Focus: Patients were asked to answer various questions at random and discuss the topics with the group. Healthy Interaction: The focus of this group is to practice communication, appropriate interaction with peers, as well as healthy discussion.  Participation Level:  Did not attend  Participation Quality:  Not applicable   Affect:  Not applicable  Cognitive:  Not applicable  Insight: Not applicable  Engagement in Group:  Did not attend  Modes of Intervention:  Activity, Discussion, Socialization and Support  Additional Comments:  Patient was in the dayroom when group began but left when group started/  Ferrel LoganAmanda A Charnay Nazario 06/09/2017, 2:53 PM

## 2017-06-09 NOTE — Plan of Care (Signed)
  Problem: Activity: Goal: Interest or engagement in activities will improve Outcome: Progressing   Problem: Activity: Goal: Sleeping patterns will improve Outcome: Progressing   Problem: Safety: Goal: Periods of time without injury will increase Outcome: Progressing   Problem: Education: Goal: Will be free of psychotic symptoms Outcome: Progressing

## 2017-06-09 NOTE — [facility] Group Notes (Signed)
[facility] LCSW Group Therapy Note  Date/Time: 06/09/17, 1100  Type of Therapy/Topic:  Group Therapy:  Feelings about Diagnosis  Participation Level:  Minimal   Mood:pleasant   Description of Group:    This group will allow patients to explore their thoughts and feelings about diagnoses they have received. Patients will be guided to explore their level of understanding and acceptance of these diagnoses. Facilitator will encourage patients to process their thoughts and feelings about the reactions of others to their diagnosis, and will guide patients in identifying ways to discuss their diagnosis with significant others in their lives. This group will be process-oriented, with patients participating in exploration of their own experiences as well as giving and receiving support and challenge from other group members.   Therapeutic Goals: 1. Patient will demonstrate understanding of diagnosis as evidence by identifying two or more symptoms of the disorder:  2. Patient will be able to express two feelings regarding the diagnosis 3. Patient will demonstrate ability to communicate their needs through discussion and/or role plays  Summary of Patient Progress:Pt appeared somewhat taken aback by some of the bizarre comments made by other group members.  Pt did respond to CSW questions and stated that he is aware of his diagnosis and trying to pursue appropriate treatment.        Therapeutic Modalities:   Cognitive Behavioral Therapy Brief Therapy Feelings Identification   Daleen SquibbGreg Kaushal Vannice, LCSW

## 2017-06-09 NOTE — Progress Notes (Addendum)
[facility] MD Progress Note  06/09/2017 3:48 PM Gilbert Meyers  MRN:  [id number]   Subjective: Gilbert Meyers seen resting in bed.  Reports his mood has been improving since his admission.  Patient report he is eager to discharge soon.  Reports he is taking medications as prescribed and tolerating them well.  Risperdal was decreased for his AM dose. Londell reports he is feels less groggy today.  Patient continues to deny hearing voices.  Continues to report a good appetite.  Patient reports he has been attending group sessions and interacts well with peers and staff.  Denies depression or depressive symptoms.  Patient continues to be visible on the unit.  Support and encouragement and reassurance was provided.  History: per assessment note Gilbert Meyers is a 32 y/o M with history of schizophrenia who was admitted voluntarily from [facility] ED where he was brought voluntarily with worsening symptoms of psychosis including AH, poor intake of food and water for the past 5 weeks, anxiety, and somatic delusions regarding his jaw, throat, and arm. Pt had also reported use of cannabis about once per month prior to admission. Pt had been changed about 5 weeks ago from Navane to risperdal 0.25mg  po qDay, and pt reports he has been maintaining good adherence to that dose prior to admission.Pt was restarted on risperdal at higher dose of 1mg  po BID upon arrival to [facility]. He had improvement of his poor intake of food and water during his admission, and he has reported incremental improvement of his presenting symptoms.   Principal Problem: Schizophrenia (HCC) Diagnosis:   Patient Active Problem List   Diagnosis Date Noted  . Schizophrenia (HCC) [F20.9] 06/03/2017  . Hypertension [I10] 02/19/2017  . 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 Anxiety   . Bipolar 1  disorder (HCC)   . Schizophrenia (HCC)    History reviewed. No pertinent surgical history. Family History:  Family History  Problem Relation Age of Onset  . Prostate cancer Paternal Grandfather   . Colon cancer Neg Hx    Family Psychiatric  History: see H&p Social History:  Social History   Substance and Sexual Activity  Alcohol Use No   Comment: former     Social History   Substance and Sexual Activity  Drug Use Yes  . Types: Marijuana    Social History   Socioeconomic History  . Marital status: Single    Spouse name: Not on file  . Number of children: 0  . Years of education: Not on file  . Highest education level: Not on file  Occupational History  . Occupation: Unemployed   Social Needs  . Financial resource strain: Not on file  . Food insecurity:    Worry: Not on file    Inability: Not on file  . Transportation needs:    Medical: Not on file    Non-medical: Not on file  Tobacco Use  . Smoking status: Current Some Day Smoker    Packs/day: 0.10    Types: E-cigarettes  . Smokeless tobacco: Never Used  Substance and Sexual Activity  . Alcohol use: No    Comment: former  . Drug use: Yes    Types: Marijuana  . Sexual activity: Not Currently  Lifestyle  . Physical activity:    Days per week: Not on file    Minutes per session: Not  on file  . Stress: Not on file  Relationships  . Social connections:    Talks on phone: Not on file    Gets together: Not on file    Attends religious service: Not on file    Active member of club or organization: Not on file    Attends meetings of clubs or organizations: Not on file    Relationship status: Not on file  Other Topics Concern  . Not on file  Social History Narrative  . Not on file   Additional Social History:                         Sleep: Good  Appetite:  Good  Current Medications: Current Facility-Administered Medications  Medication Dose Route Frequency Provider Last Rate Last Dose  .  acetaminophen (TYLENOL) tablet 650 mg  650 mg Oral Q6H PRN Fransisca Kaufmannavis, Laura A, NP      . benztropine (COGENTIN) tablet 0.5 mg  0.5 mg Oral BID Oneta RackLewis, Tanika N, NP   0.5 mg at 06/09/17 0943  . hydrOXYzine (ATARAX/VISTARIL) tablet 25 mg  25 mg Oral Q6H PRN Thermon Leylandavis, Laura A, NP   25 mg at 06/08/17 2303  . nicotine polacrilex (NICORETTE) gum 2 mg  2 mg Oral PRN Kerry HoughSimon, Spencer E, PA-C      . pneumococcal 23 valent vaccine (PNU-IMMUNE) injection 0.5 mL  0.5 mL Intramuscular Tomorrow-1000 Simon, Spencer E, PA-C      . risperiDONE (RISPERDAL) tablet 0.5 mg  0.5 mg Oral Daily Oneta RackLewis, Tanika N, NP   0.5 mg at 06/09/17 [id number]   And  . risperiDONE (RISPERDAL) tablet 1 mg  1 mg Oral QHS Oneta RackLewis, Tanika N, NP   1 mg at 06/08/17 2025  . traZODone (DESYREL) tablet 50 mg  50 mg Oral QHS PRN Micheal Likensainville, Christopher T, MD   50 mg at 06/08/17 2025    Lab Results: No results found for this or any previous visit (from the past 48 hour(s)).  Blood Alcohol level:  Lab Results  Component Value Date   ETH <10 06/01/2017   ETH <10 05/09/2017    Metabolic Disorder Labs: Lab Results  Component Value Date   HGBA1C 5.4 06/05/2017   MPG 108.28 06/05/2017   Lab Results  Component Value Date   PROLACTIN 53.0 (H) 06/05/2017   PROLACTIN 22.9 (H) 03/25/2015   Lab Results  Component Value Date   CHOL 142 06/05/2017   TRIG 163 (H) 06/05/2017   HDL 35 (L) 06/05/2017   CHOLHDL 4.1 06/05/2017   VLDL 33 06/05/2017   LDLCALC 74 06/05/2017   LDLCALC 136 (H) 03/25/2015    Physical Findings: AIMS: Facial and Oral Movements Muscles of Facial Expression: None, normal Lips and Perioral Area: None, normal Jaw: None, normal Tongue: None, normal,Extremity Movements Upper (arms, wrists, hands, fingers): None, normal Lower (legs, knees, ankles, toes): None, normal, Trunk Movements Neck, shoulders, hips: None, normal, Overall Severity Severity of abnormal movements (highest score from questions above): None, normal Incapacitation  due to abnormal movements: None, normal Patient's awareness of abnormal movements (rate only patient's report): No Awareness, Dental Status Current problems with teeth and/or dentures?: No Does patient usually wear dentures?: No  CIWA:    COWS:     Musculoskeletal: Strength & Muscle Tone: within normal limits Gait & Station: normal Patient leans: N/A  Psychiatric Specialty Exam: Physical Exam  Nursing note and vitals reviewed. Cardiovascular: Normal rate.  Neurological: He is alert.  Psychiatric: He  has a normal mood and affect. His behavior is normal.    Review of Systems  Psychiatric/Behavioral: Positive for hallucinations. Negative for depression and suicidal ideas. The patient is nervous/anxious. The patient does not have insomnia.   All other systems reviewed and are negative.   Blood pressure 100/61, pulse (!) 114, temperature 98.2 F (36.8 C), temperature source Oral, resp. rate 18, height 5\' 10"  (1.778 m), weight 92.1 kg (203 lb), SpO2 100 %.Body mass index is 29.13 kg/m.  General Appearance: Casual and Fairly Groomed  Eye Contact:  Good  Speech:  Clear and Coherent and Normal Rate  Volume:  Normal  Mood:  Euthymic  Affect:  Congruent and Flat  Thought Process:  Coherent and Goal Directed  Orientation:  Full (Time, Place, and Person)  Thought Content:  Hallucinations: Auditory improving   Suicidal Thoughts:  No  Homicidal Thoughts:  No  Memory:  Immediate;   Fair Recent;   Fair Remote;   Fair  Judgement:  Fair  Insight:  Fair  Psychomotor Activity:  Normal  Concentration:  Concentration: Fair  Recall:  [employer] of Knowledge:  Fair  Language:  Fair  Akathisia:  No  Handed:    AIMS (if indicated):     Assets:  Communication Skills Resilience Social Support  ADL's:  Intact  Cognition:  WNL  Sleep:  Number of Hours: 6.75   Treatment Plan Summary: Daily contact with patient to assess and evaluate symptoms and progress in treatment and Medication  management   Continue treatment plan on 4/72019 as listed below except were noted.  - Schizophrenia -  Continue Risperdal  0.5mg  po QD and continue 1 mg QHS. - EPS  Continue Cogentin 0.5 mg grams p.o. twice daily -Anxiety -Continue atarax 25mg  po q6h prn anxiety  - Insomnia -Continue trazodone 50mg  po qhs  -Encourage participation in groups and therapeutic milieu -Disposition planning will be ongoing    Oneta Rack, NP 06/09/2017, 3:48 PM   Agree with NP Progress Note

## 2017-06-10 MED ORDER — TRAZODONE HCL 100 MG PO TABS: 100.0000 mg | ORAL_TABLET | Freq: Every evening | ORAL | 0 refills | Status: DC | PRN

## 2017-06-10 MED ORDER — RISPERIDONE 0.5 MG PO TABS: ORAL_TABLET | ORAL | 0 refills | Status: DC

## 2017-06-10 MED ORDER — LISINOPRIL 5 MG PO TABS
5.0000 mg | ORAL_TABLET | Freq: Every day | ORAL | Status: DC
  Filled 2017-06-10: qty 7

## 2017-06-10 MED ORDER — NICOTINE POLACRILEX 2 MG MT GUM: 2.0000 mg | CHEWING_GUM | OROMUCOSAL | 0 refills | Status: DC | PRN

## 2017-06-10 MED ORDER — HYDROXYZINE HCL 25 MG PO TABS: 25.0000 mg | ORAL_TABLET | Freq: Four times a day (QID) | ORAL | 0 refills | Status: DC | PRN

## 2017-06-10 MED ORDER — LISINOPRIL 5 MG PO TABS: 5.0000 mg | ORAL_TABLET | Freq: Every day | ORAL | 0 refills | Status: DC

## 2017-06-10 MED ORDER — BENZTROPINE MESYLATE 0.5 MG PO TABS
0.5000 mg | ORAL_TABLET | Freq: Two times a day (BID) | ORAL | 0 refills | Status: DC
Start: 2017-06-10 — End: 2022-06-04

## 2017-06-10 NOTE — Progress Notes (Signed)
  [facility] Discharge Plan :  Will you be returning to the same living situation after discharge:  Yes,  home At discharge, do you have transportation home?: Yes,  family Do you have the ability to pay for your medications: Yes,  mental health  Release of information consent forms completed and in the chart;  Patient's signature needed at discharge.  Patient to Follow up at: Follow-up Information    [facility] Follow up on 06/12/2017.   Why:  Wednesday at 8:30 for your hospital follow up appointment.  [facility] along your hospital d/c paperwork Contact information: [address] [place] [address] [phone]           Next level of care provider [facility] to [facility] Link:no  Safety Planning and Suicide Prevention discussed: Yes,  yes     Has patient been referred to the Quitline?: Patient refused referral  Patient has been referred for addiction treatment: N/A  Ida [name] Sharley Keeler, LCSW 06/10/2017, 9:17 AM

## 2017-06-10 NOTE — Progress Notes (Signed)
Patient discharged to lobby. Patient was stable and appreciative at that time. All papers, samples and prescriptions were given and valuables returned. Verbal understanding expressed. Denies SI/HI and A/VH. Patient given opportunity to express concerns and ask questions.  

## 2017-06-10 NOTE — Progress Notes (Signed)
Recreation Therapy Notes  INPATIENT RECREATION TR PLAN  Patient Details Name: Gilbert Meyers MRN: [id number] DOB: 09/22/1985 Today's Date: 06/10/2017  Rec Therapy Plan Is patient appropriate for Therapeutic Recreation?: Yes Treatment times per week: At least three  Estimated Length of Stay: 5-7 days  TR Treatment/Interventions: Group participation (Appropriate participation in Recreation Therapy group tx.)  Discharge Criteria Pt will be discharged from therapy if:: Discharged Treatment plan/goals/alternatives discussed and agreed upon by:: Patient/family  Discharge Summary Short term goals set: See Care Plan  Short term goals met: Adequate for discharge Progress toward goals comments: Groups attended Which groups?: Self-esteem Therapeutic equipment acquired: None  Reason patient discharged from therapy: Discharge from hospital Pt/family agrees with progress & goals achieved: Yes Date patient discharged from therapy: 06/10/17  Ranell Patrick, Recreation Therapy Intern   Ranell Patrick 06/10/2017, 1:33 PM

## 2017-06-10 NOTE — Discharge Summary (Addendum)
Physician Discharge Summary Note  Patient:  Gilbert Meyers is an 32 y.o., male MRN:  [id number] DOB:  10-Jul-1985 Patient phone:  [phone] (home)  Patient address:   [address] [place] [address],  Total Time spent with patient: Greater than 30 minutes  Date of Admission:  06/03/2017  Date of Discharge: 06/10/2017  Reason for Admission: Worsening symptoms of psychosis including AH, poor intake of food and water for the past 5 weeks, anxiety, and somatic delusions regarding his jaw, throat and arm.  Principal Problem: Schizophrenia [facility])  Discharge Diagnoses: Patient Active Problem List   Diagnosis Date Noted  . Schizophrenia (HCC) [F20.9] 06/03/2017  . Hypertension [I10] 02/19/2017  . Undifferentiated schizophrenia (HCC) [F20.3] 03/25/2015   Past Psychiatric History: Psychosis and mood instability.  Past Medical History:  Past Medical History:  Diagnosis Date  . Anxiety   . Bipolar 1 disorder (HCC)   . Schizophrenia (HCC)    History reviewed. No pertinent surgical history.  Family History:  Family History  Problem Relation Age of Onset  . Prostate cancer Paternal Grandfather   . Colon cancer Neg Hx    Family Psychiatric  History: See Md's SRA.  Social History:  Social History   Substance and Sexual Activity  Alcohol Use No   Comment: former     Social History   Substance and Sexual Activity  Drug Use Yes  . Types: Marijuana    Social History   Socioeconomic History  . Marital status: Single    Spouse name: Not on file  . Number of children: 0  . Years of education: Not on file  . Highest education level: Not on file  Occupational History  . Occupation: Unemployed   Social Needs  . Financial resource strain: Not on file  . Food insecurity:    Worry: Not on file    Inability: Not on file  . Transportation needs:    Medical: Not on file    Non-medical: Not on file  Tobacco Use  . Smoking status: Current Some Day Smoker    Packs/day: 0.10     Types: E-cigarettes  . Smokeless tobacco: Never Used  Substance and Sexual Activity  . Alcohol use: No    Comment: former  . Drug use: Yes    Types: Marijuana  . Sexual activity: Not Currently  Lifestyle  . Physical activity:    Days per week: Not on file    Minutes per session: Not on file  . Stress: Not on file  Relationships  . Social connections:    Talks on phone: Not on file    Gets together: Not on file    Attends religious service: Not on file    Active member of club or organization: Not on file    Attends meetings of clubs or organizations: Not on file    Relationship status: Not on file  Other Topics Concern  . Not on file  Social History Narrative  . Not on file   Hospital Course: (Per Md's SRA): Gilbert Meyers is a 32 y/o M with history of schizophrenia who was admitted voluntarily from [facility] ED where he was brought voluntarily with worsening symptoms of psychosis including AH, poor intake of food and water for the past 5 weeks, anxiety, and somatic delusions regarding his jaw, throat, and arm. Pt had also reported use of cannabis about once per month prior to admission. Pt had been changed about 5 weeks ago from Navane to risperdal 0.25mg  po  qDay, and pt reports he has been maintaining good adherence to that dose prior to admission.Pt was restarted on risperdal at higher doseof 1mg  po BIDupon [facility], which was later adjusted by reducing AM dose to avoid daytime sedation. He had improvement of his poor intake of food and water during his admission, and he has reported incremental improvement of his presenting symptoms of AH.  Besides the use of Risperdal for mood control, Eugenio was also medicated & discharged on; Cogentin 0.5 mg for EPS, Vistaril 25 mg prn for anxiety, Nicorette gum 2 mg for smoking cessation & Trazodone 100 mg for insomnia. He also was resumed & discharged on his pertinent home medications for the other medical issues presented. He  tolerated his treatment regimen without any adverse effects or reactions reported. He was enrolled & participated in the group counseling sessions being offered & held on this unit. He learned coping skills.  Today upon his discharge.evaluation, pt shares, "I'm doing good." he denies any specific concerns. He is sleeping well. His appetite is good. He denies SI/HI/AH/VH. He feels that his medications have been helpful, and he is in agreement to continue his current regimen without changes. He plans on filing for disability after discharge. He was able to engage in safety planning including plan to return to [facility] or contact emergency services if he feels unable to maintain his own safety or the safety of others. Pt had no further questions, comments, or concerns.  Upon discharge, Stanford was both mentally & medically stable. He is discharged to continue mental health care on an outpatient basis as noted below. He is provided with all the necessary information needed to make this appointment without problems. He left [facility] with all personal belongings in no apparent distress. Transportation per family.  Physical Findings: AIMS: Facial and Oral Movements Muscles of Facial Expression: None, normal Lips and Perioral Area: None, normal Jaw: None, normal Tongue: None, normal,Extremity Movements Upper (arms, wrists, hands, fingers): None, normal Lower (legs, knees, ankles, toes): None, normal, Trunk Movements Neck, shoulders, hips: None, normal, Overall Severity Severity of abnormal movements (highest score from questions above): None, normal Incapacitation due to abnormal movements: None, normal Patient's awareness of abnormal movements (rate only patient's report): No Awareness, Dental Status Current problems with teeth and/or dentures?: No Does patient usually wear dentures?: No  CIWA:    COWS:     Musculoskeletal: Strength & Muscle Tone: within normal limits Gait & Station: normal Patient leans:  N/A  Psychiatric Specialty Exam: Review of Systems  Constitutional: Negative.   HENT: Negative.   Eyes: Negative.   Respiratory: Negative.   Cardiovascular: Negative.   Gastrointestinal: Negative.   Genitourinary: Negative.   Musculoskeletal: Negative.   Skin: Negative.   Neurological: Negative.   Endo/Heme/Allergies: Negative.   Psychiatric/Behavioral: Positive for depression (Stable), hallucinations (Hx. Psychosis) and substance abuse (Barbiturate use disorder). Negative for memory loss and suicidal ideas. The patient has insomnia (Stable). The patient is not nervous/anxious.   All other systems reviewed and are negative.   Blood pressure (!) 94/55, pulse (!) 114, temperature 98.3 F (36.8 C), temperature source Oral, resp. rate 18, height 5\' 10"  (1.778 m), weight 92.1 kg (203 lb), SpO2 100 %.Body mass index is 29.13 kg/m.  See Md'sSRA.   Has this patient used any form of tobacco in the last 30 days? (Cigarettes, Smokeless Tobacco, Cigars, and/or Pipes): Yes, an FDA-approved tobacco cessation medication was offered at discharge.  Metabolic Disorder Labs:  Lab Results  Component Value  Date   HGBA1C 5.4 06/05/2017   MPG 108.28 06/05/2017   Lab Results  Component Value Date   PROLACTIN 53.0 (H) 06/05/2017   PROLACTIN 22.9 (H) 03/25/2015   Lab Results  Component Value Date   CHOL 142 06/05/2017   TRIG 163 (H) 06/05/2017   HDL 35 (L) 06/05/2017   CHOLHDL 4.1 06/05/2017   VLDL 33 06/05/2017   LDLCALC 74 06/05/2017   LDLCALC 136 (H) 03/25/2015   See Psychiatric Specialty Exam and Suicide Risk Assessment completed by Attending Physician prior to discharge.  Discharge destination:  Home  Is patient on multiple antipsychotic therapies at discharge:  No   Has Patient had three or more failed trials of antipsychotic monotherapy by history:  No  Recommended Plan for Multiple Antipsychotic Therapies: NA  Allergies as of 06/10/2017   No Known Allergies     Medication List     TAKE these medications     Indication  benztropine 0.5 MG tablet Commonly known as:  COGENTIN Take 1 tablet (0.5 mg total) by mouth 2 (two) times daily. For prevention of drug induced tremors  Indication:  Extrapyramidal Reaction caused by Medications   hydrOXYzine 25 MG tablet Commonly known as:  ATARAX/VISTARIL Take 1 tablet (25 mg total) by mouth every 6 (six) hours as needed for anxiety.  Indication:  Feeling Anxious   lisinopril 5 MG tablet Commonly known as:  PRINIVIL,ZESTRIL Take 1 tablet (5 mg total) by mouth daily. For high blood pressure What changed:  additional instructions  Indication:  High Blood Pressure Disorder   nicotine polacrilex 2 MG gum Commonly known as:  NICORETTE Take 1 each (2 mg total) by mouth as needed for smoking cessation. (May purchase from over the counter): For smoking cessation  Indication:  Nicotine Addiction   risperiDONE 0.5 MG tablet Commonly known as:  RISPERDAL Take 1 tablet (0.5 mg) by mouth in the morning & 2 tablets (1 mg) at bedtime: For mood control What changed:    medication strength  how much to take  how to take this  when to take this  additional instructions  Indication:  Mood control   traZODone 100 MG tablet Commonly known as:  DESYREL Take 1 tablet (100 mg total) by mouth at bedtime as needed for sleep. What changed:    medication strength  how much to take  when to take this  reasons to take this  Indication:  Trouble Sleeping      Follow-up Information    [facility] Follow up on 06/12/2017.   Why:  Wednesday at 8:30 for your hospital follow up appointment.  [facility] along your hospital d/c paperwork Contact information: [address] [place] [address] [phone]          Follow-up recommendations: Activity:  As tolerated Diet: As recommended by your primary care doctor. Keep all scheduled follow-up appointments as recommended.   Comments: Patient is instructed prior to  discharge to: Take all medications as prescribed by his/her mental healthcare provider. Report any adverse effects and or reactions from the medicines to his/her outpatient provider promptly. Patient has been instructed & cautioned: To not engage in alcohol and or illegal drug use while on prescription medicines. In the event of worsening symptoms, patient is instructed to call the crisis hotline, 911 and or go to the nearest ED for appropriate evaluation and treatment of symptoms. To follow-up with his/her primary care provider for your other medical issues, concerns and or health care needs.  Signed: Armandina Stammer, PMHNP, FNP-BC 06/10/2017, 10:26 AM  Patient seen, Suicide Assessment Completed.  Disposition Plan Reviewed

## 2017-06-10 NOTE — Progress Notes (Signed)
Recreation Therapy Notes   Date: 4.8.19 Time: 10:00 a.m. Location: [address] Dayroom   Group Topic: Healthy Support Systems   Goal Area(s) Addresses:  Goal 1.1: To increase awareness of a healthy support system - Patient will identify the importance of a healthy support system - Patient will identify their own support system  - Patient will identify ways on how to improve their support system Goal 2.1: To improve communication  - Patient will participate in opening discussion  - Patient will communicate with peers during team building activity  - Patient will participate in final discussion   Intervention: STEM  Activity: Straw Tower: Patients must construct a tower using twenty-five straws and masking tape. Recreation Therapy Intern tested the stability of their tower by placing a book on top of their tower. Afterwards, Recreation Therapy Intern begun processing with group about building a healthy support system, and how communication is needed throughout the process.   Education: Database administratorHealthy Support System, Communication   Education Outcome: Acknowledges Education  Clinical Observations/Feedback: Patient did not attend    Sheryle HailDarian Shira Bobst, Recreation Therapy Intern   Sheryle HailDarian Arron Mcnaught 06/10/2017 11:31 AM

## 2017-06-10 NOTE — Plan of Care (Signed)
4.8.19. Patient engaged in group with a calm and appropriate mood at least 1x within 5 Recreation Therapy group sessions

## 2017-06-10 NOTE — [facility] Suicide Risk Assessment (Signed)
[facility] INPATIENT:  Family/Significant Other Suicide Prevention Education  Suicide Prevention Education:  Education Completed; Father, [phone], Mr Tivis RingerLarsen  has been identified by the patient as the family member/significant other with whom the patient will be residing, and identified as the person(s) who will aid the patient in the event of a mental health crisis (suicidal ideations/suicide attempt).  With written consent from the patient, the family member/significant other has been provided the following suicide prevention education, prior to the and/or following the discharge of the patient.  The suicide prevention education provided includes the following:  Suicide risk factors  Suicide prevention and interventions  National Suicide Hotline telephone number  [facility] assessment telephone number  [facility] Emergency Assistance 911  [facility] and/or Residential Mobile Crisis Unit telephone number  Request made of family/significant other to:  Remove weapons (e.g., guns, rifles, knives), all items previously/currently identified as safety concern.    Remove drugs/medications (over-the-counter, prescriptions, illicit drugs), all items previously/currently identified as a safety concern.  The family member/significant other verbalizes understanding of the suicide prevention education information provided.  The family member/significant other agrees to remove the items of safety concern listed above.  Ida [name] Sherica Paternostro 06/10/2017, 9:18 AM

## 2017-06-10 NOTE — [facility] Suicide Risk Assessment (Signed)
[facility] Discharge Suicide Risk Assessment   Principal Problem: Schizophrenia [facility]) Discharge Diagnoses:  Patient Active Problem List   Diagnosis Date Noted  . Schizophrenia (HCC) [F20.9] 06/03/2017  . Hypertension [I10] 02/19/2017  . 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  Respiratory: Negative for cough and shortness of breath.   Cardiovascular: Negative for chest pain.  Gastrointestinal: Negative for abdominal pain, heartburn, nausea and vomiting.  Psychiatric/Behavioral: Negative for depression, hallucinations and suicidal ideas. The patient is not nervous/anxious and does not have insomnia.     Blood pressure (!) 94/55, pulse (!) 114, temperature 98.3 F (36.8 C), temperature source Oral, resp. rate 18, height 5\' 10"  (1.778 m), weight 92.1 kg (203 lb), SpO2 100 %.Body mass index is 29.13 kg/m.  General Appearance: Casual and Fairly Groomed  Patent attorney::  Good  Speech:  Clear and Coherent and Normal Rate  Volume:  Normal  Mood:  Euthymic  Affect:  Appropriate, Congruent and Flat  Thought Process:  Coherent and Goal Directed  Orientation:  Full (Time, Place, and Person)  Thought Content:  Logical  Suicidal Thoughts:  No  Homicidal Thoughts:  No  Memory:  Immediate;   Fair Recent;   Fair Remote;   Fair  Judgement:  Intact  Insight:  Fair  Psychomotor Activity:  Normal  Concentration:  Good  Recall:  Good  Fund of Knowledge:Good  Language: Good  Akathisia:  No  Handed:    AIMS (if indicated):     Assets:  Communication Skills Resilience  Sleep:  Number of Hours: 6.25  Cognition: WNL  ADL's:  Intact   Mental Status Per Nursing Assessment::   On Admission:     Demographic Factors:  Male, Adolescent or  young adult, Caucasian and Unemployed  Loss Factors: Financial problems/change in socioeconomic status  Historical Factors: Impulsivity  Risk Reduction Factors:   Positive social support, Positive therapeutic relationship and Positive coping skills or problem solving skills  Continued Clinical Symptoms:  Schizophrenia:   Paranoid or undifferentiated type  Cognitive Features That Contribute To Risk:  None    Suicide Risk:  Minimal: No identifiable suicidal ideation.  Patients presenting with no risk factors but with morbid ruminations; may be classified as minimal risk based on the severity of the depressive symptoms  Follow-up Information    Services, [facility] Follow up on 06/12/2017.   Why:  Wednesday at 8:30 for your hospital follow up appointment.  [facility]in[facility] along your hospital d/c paperwork Contact information: [address] [place] [address] [phone]         Subjective Data:  Gilbert Meyers is a 32 y/o M with history of schizophrenia who was admitted voluntarily from [facility] ED where he was brought voluntarily with worsening symptoms of psychosis including AH, poor intake of food and water for the past 5 weeks, anxiety, and somatic delusions regarding his jaw, throat, and arm. Pt had also reported use of cannabis about once per month prior to admission. Pt had been changed about 5 weeks ago from Navane to risperdal 0.25mg  po qDay, and pt reports he has been maintaining good adherence to that dose prior to admission.Pt was restarted on risperdal at higher doseof 1mg  po BIDupon arrival to [facility], which was later adjusted by reducing AM dose to avoid daytime  sedation. He had improvement of his poor intake of food and water during his admission, and he has reported incremental improvement of his presenting symptoms of AH.  Today upon evaluation, pt shares, "I'm doing good." he denies any specific concerns. He is sleeping well. His appetite is good. He denies SI/HI/AH/VH.  He feels that his medications have been helpful, and he is in agreement to continue his current regimen without changes. He plans on filing for disability after discharge. He was able to engage in safety planning including [facility] or contact emergency services if he feels unable to maintain his own safety or the safety of others. Pt had no further questions, comments, or concerns.    Plan Of Care/Follow-up recommendations:   -Discharge to outpatient level of care  - Schizophrenia - Continue risperdal 0.5mg  po qAM + 1mg  po qhs  -Anxiety -Continue atarax 25mg  po q6h prn anxiety  - Insomnia -Continue trazodone 50mg  po qhs  -Encourage participation in groups and therapeutic milieu  -Disposition planning will be ongoing  Activity:  as tolerated Diet:  normal Tests:  NA Other:  see above for DC plan  Gilbert Likenshristopher T Elgin Carn, MD 06/10/2017, 9:43 AM

## 2017-06-10 NOTE — Tx Team (Signed)
Interdisciplinary Treatment and Diagnostic Plan Update  06/10/2017 Time of Session: 9:10 AM  Gilbert Meyers MRN: [id number]  Principal Diagnosis: Schizophrenia [facility])  Secondary Diagnoses: Principal Problem:   Schizophrenia (HCC)   Current Medications:  Current Facility-Administered Medications  Medication Dose Route Frequency Provider Last Rate Last Dose  . acetaminophen (TYLENOL) tablet 650 mg  650 mg Oral Q6H PRN Thermon Leyland, NP   650 mg at 06/09/17 2231  . benztropine (COGENTIN) tablet 0.5 mg  0.5 mg Oral BID Oneta Rack, NP   0.5 mg at 06/10/17 0758  . hydrOXYzine (ATARAX/VISTARIL) tablet 25 mg  25 mg Oral Q6H PRN Fransisca Kaufmann A, NP   25 mg at 06/09/17 2100  . nicotine polacrilex (NICORETTE) gum 2 mg  2 mg Oral PRN Kerry Hough, PA-C      . pneumococcal 23 valent vaccine (PNU-IMMUNE) injection 0.5 mL  0.5 mL Intramuscular Tomorrow-1000 Simon, Spencer E, PA-C      . risperiDONE (RISPERDAL) tablet 0.5 mg  0.5 mg Oral Daily Oneta Rack, NP   0.5 mg at 06/10/17 0758   And  . risperiDONE (RISPERDAL) tablet 1 mg  1 mg Oral QHS Oneta Rack, NP   1 mg at 06/09/17 2100  . traZODone (DESYREL) tablet 100 mg  100 mg Oral QHS PRN Nira Conn A, NP   100 mg at 06/09/17 2058    PTA Medications: Medications Prior to Admission  Medication Sig Dispense Refill Last Dose  . lisinopril (PRINIVIL,ZESTRIL) 5 MG tablet Take 1 tablet (5 mg total) by mouth daily. (Patient not taking: Reported on 06/01/2017) 30 tablet 3 Not Taking at Unknown time  . risperiDONE (RISPERDAL) 0.25 MG tablet Take 1 tablet by mouth daily.   2 05/31/2017 at Unknown time  . traZODone (DESYREL) 50 MG tablet Take 1 tablet (50 mg total) by mouth at bedtime. (Patient not taking: Reported on 06/01/2017) 30 tablet 0 Not Taking at Unknown time    Treatment Modalities: Medication Management, Group therapy, Case management,  1 to 1 session with clinician, Psychoeducation, Recreational therapy.  Patient Stressors: Health  problems Medication change or noncompliance  Patient Strengths: Ability for insight Capable of independent living General fund of knowledge   Physician Treatment Plan for Primary Diagnosis: Schizophrenia (HCC) Long Term Goal(s): Improvement in symptoms so as ready for discharge  Short Term Goals: Ability to identify and develop effective coping behaviors will improve Ability to identify changes in lifestyle to reduce recurrence of condition will improve  Medication Management: Evaluate patient's response, side effects, and tolerance of medication regimen.  Therapeutic Interventions: 1 to 1 sessions, Unit Group sessions and Medication administration.  Evaluation of Outcomes: Adequate for Discharge  Physician Treatment Plan for Secondary Diagnosis: Principal Problem:   Schizophrenia (HCC)  Long Term Goal(s): Improvement in symptoms so as ready for discharge  Short Term Goals: Ability to identify and develop effective coping behaviors will improve Ability to identify changes in lifestyle to reduce recurrence of condition will improve  Medication Management: Evaluate patient's response, side effects, and tolerance of medication regimen.  Therapeutic Interventions: 1 to 1 sessions, Unit Group sessions and Medication administration.  Evaluation of Outcomes: Adequate for Discharge   RN Treatment Plan for Primary Diagnosis: Schizophrenia (HCC) Long Term Goal(s): Knowledge of disease and therapeutic regimen to maintain health will improve  Short Term Goals: Ability to participate in decision making will improve, Ability to verbalize feelings will improve, Ability to identify and develop effective coping behaviors will improve and Compliance  with prescribed medications will improve  Medication Management: RN will administer medications as ordered by provider, will assess and evaluate patient's response and provide education to patient for prescribed medication. RN will report any  adverse and/or side effects to prescribing provider.  Therapeutic Interventions: 1 on 1 counseling sessions, Psychoeducation, Medication administration, Evaluate responses to treatment, Monitor vital signs and CBGs as ordered, Perform/monitor CIWA, COWS, AIMS and Fall Risk screenings as ordered, Perform wound care treatments as ordered.  Evaluation of Outcomes: Adequate for Discharge   LCSW Treatment Plan for Primary Diagnosis: Schizophrenia [facility](HCC) Long Term Goal(s): Safe transition to appropriate next level of care at discharge, Engage patient in therapeutic group addressing interpersonal concerns.  Short Term Goals: Engage patient in aftercare planning with referrals and resources, Increase social support, Facilitate acceptance of mental health diagnosis and concerns, Identify triggers associated with mental health/substance abuse issues and Increase skills for wellness and recovery  Therapeutic Interventions: Assess for all discharge needs, 1 to 1 time with Social worker, Explore available resources and support systems, Assess for adequacy in community support network, Educate family and significant other(s) on suicide prevention, Complete Psychosocial Assessment, Interpersonal group therapy.  Evaluation of Outcomes: Adequate for Discharge   Progress in Treatment: Attending groups: Yes Participating in groups: Yes Taking medication as prescribed: Yes Toleration of medication: Yes, no side effects reported at this time Family/Significant other contact made: Yes, Lenise Heralderry Ahner [phone] Patient understands diagnosis: No, limited insight  Discussing patient identified problems/goals with staff: Yes Medical problems stabilized or resolved: Yes Denies suicidal/homicidal ideation: Yes Issues/concerns per patient self-inventory: None Other: N/A  New problem(s) identified: None identified at this time.   New Short Term/Long Term Goal(s): "To get better, live a normal life, work hard,  and be an Solicitorupstanding citizen".   Discharge Plan or Barriers:  Upon discharge pt will return home with his father and follow up with [facility].   Reason for Continuation of Hospitalization:    Estimated Length of Stay: 06/10/17  Attendees: Patient:  06/10/2017  9:10 AM  Physician: Jolyne Loahristopher Rainville, MD 06/10/2017  9:10 AM  Nursing: Estella HuskElizabeth Awofabeju, RN 06/10/2017  9:10 AM  RN Care Manager: Onnie BoerJennifer Clark, RN 06/10/2017  9:10 AM  Social Worker: Richelle Itood Sue Fernicola, LCSW; Melba CoonAngel Webster, Social Work Intern 06/10/2017  9:10 AM  Recreational Therapist: Caroll RancherMarjette Lindsay, LRT 06/10/2017  9:10 AM  Other: Tomasita Morrowelora Sutton, P4CC 06/10/2017  9:10 AM  Other:  06/10/2017  9:10 AM  Other: 06/10/2017  9:10 AM    Scribe for Treatment Team: Ida Rogueodney B Jashad Depaula, LCSW 06/10/2017 9:10 AM

## 2019-01-31 ENCOUNTER — Encounter (HOSPITAL_COMMUNITY): Payer: Self-pay | Admitting: Emergency Medicine

## 2019-01-31 ENCOUNTER — Other Ambulatory Visit: Payer: Self-pay

## 2019-01-31 ENCOUNTER — Emergency Department (HOSPITAL_COMMUNITY): Payer: Medicaid Other

## 2019-01-31 ENCOUNTER — Emergency Department (HOSPITAL_COMMUNITY)
Admission: EM | Admit: 2019-01-31 | Discharge: 2019-01-31 | Disposition: A | Discharge status: Home / Self Care | Payer: Medicaid Other | Attending: Emergency Medicine | Admitting: Emergency Medicine

## 2019-01-31 DIAGNOSIS — Z79899 Other long term (current) drug therapy: Secondary | ICD-10-CM | POA: Diagnosis not present

## 2019-01-31 DIAGNOSIS — Z87891 Personal history of nicotine dependence: Secondary | ICD-10-CM | POA: Diagnosis not present

## 2019-01-31 DIAGNOSIS — I10 Essential (primary) hypertension: Secondary | ICD-10-CM | POA: Insufficient documentation

## 2019-01-31 DIAGNOSIS — R1010 Upper abdominal pain, unspecified: Secondary | ICD-10-CM

## 2019-01-31 LAB — URINALYSIS, ROUTINE W REFLEX MICROSCOPIC
Bilirubin Urine: NEGATIVE
Glucose, UA: NEGATIVE mg/dL
Hgb urine dipstick: NEGATIVE
Ketones, ur: NEGATIVE mg/dL
Leukocytes,Ua: NEGATIVE
Nitrite: NEGATIVE
Protein, ur: NEGATIVE mg/dL
Specific Gravity, Urine: 1.028 (ref 1.005–1.030)
pH: 5 (ref 5.0–8.0)

## 2019-01-31 LAB — COMPREHENSIVE METABOLIC PANEL
ALT: 24 U/L (ref 0–44)
AST: 22 U/L (ref 15–41)
Albumin: 4.7 g/dL (ref 3.5–5.0)
Alkaline Phosphatase: 77 U/L (ref 38–126)
Anion gap: 10 (ref 5–15)
BUN: 12 mg/dL (ref 6–20)
CO2: 24 mmol/L (ref 22–32)
Calcium: 9.1 mg/dL (ref 8.9–10.3)
Chloride: 103 mmol/L (ref 98–111)
Creatinine, Ser: 0.96 mg/dL (ref 0.61–1.24)
GFR calc Af Amer: >60 mL/min (ref >60)
GFR calc non Af Amer: >60 mL/min (ref >60)
Glucose, Bld: 113 mg/dL — ABNORMAL HIGH (ref 70–99)
Potassium: 4 mmol/L (ref 3.5–5.1)
Sodium: 137 mmol/L (ref 135–145)
Total Bilirubin: 1.3 mg/dL — ABNORMAL HIGH (ref 0.3–1.2)
Total Protein: 7.6 g/dL (ref 6.5–8.1)

## 2019-01-31 LAB — CBC
HCT: 44.1 % (ref 39.0–52.0)
Hemoglobin: 14.6 g/dL (ref 13.0–17.0)
MCH: 29.2 pg (ref 26.0–34.0)
MCHC: 33.1 g/dL (ref 30.0–36.0)
MCV: 88.2 fL (ref 80.0–100.0)
Platelets: 284 10*3/uL (ref 150–400)
RBC: 5 MIL/uL (ref 4.22–5.81)
RDW: 13 % (ref 11.5–15.5)
WBC: 10.4 10*3/uL (ref 4.0–10.5)
nRBC: 0 % (ref 0.0–0.2)

## 2019-01-31 LAB — LIPASE, BLOOD: Lipase: 30 U/L (ref 11–51)

## 2019-01-31 MED ORDER — PANTOPRAZOLE SODIUM 40 MG IV SOLR
40.0000 mg | Freq: Once | INTRAVENOUS | Status: AC
  Administered 2019-01-31: 40 mg via INTRAVENOUS
  Filled 2019-01-31: qty 40

## 2019-01-31 MED ORDER — PANTOPRAZOLE SODIUM 20 MG PO TBEC: 20.0000 mg | DELAYED_RELEASE_TABLET | Freq: Every day | ORAL | 0 refills | Status: DC

## 2019-01-31 MED ORDER — HYDROCODONE-ACETAMINOPHEN 5-325 MG PO TABS: ORAL_TABLET | ORAL | 0 refills | Status: DC

## 2019-01-31 MED ORDER — IOHEXOL 300 MG/ML  SOLN
100.0000 mL | Freq: Once | INTRAMUSCULAR | Status: AC | PRN
  Administered 2019-01-31: 100 mL via INTRAVENOUS

## 2019-01-31 MED ORDER — ONDANSETRON 4 MG PO TBDP: 4.0000 mg | ORAL_TABLET | Freq: Once | ORAL | Status: DC | PRN

## 2019-01-31 NOTE — ED Triage Notes (Signed)
Pt states that he is having bad stomach pains with n/v he denies diarrhea

## 2019-01-31 NOTE — ED Notes (Signed)
PT REQUESTS A WORK NOTE WHEN HE LEAVES PLEASE

## 2019-01-31 NOTE — Discharge Instructions (Signed)
You have been scheduled for an ultrasound of your abdomen for tomorrow morning at 10:00 am.  Arrive here at 9:45 to register.  Nothing to eat or drink after midnight tonight.

## 2019-01-31 NOTE — ED Notes (Signed)
To CT

## 2019-01-31 NOTE — ED Provider Notes (Signed)
[facility] EMERGENCY DEPARTMENT Provider Note   CSN: [id number] Arrival date & time: 01/31/19  1142     History   Chief Complaint Chief Complaint  Patient presents with  . Abdominal Pain    HPI Gilbert Meyers is a 33 y.o. male.     HPI   Gilbert Meyers is a 33 y.o. male who presents to the Emergency Department complaining of upper abdominal pain with nausea and vomiting.  Symptoms have been waxing and waning for several months.  States the pain has become more constant over the last several days and associated with more vomiting.  Pain does not radiate.  He has tried over-the-counter medications such as Pepto-Bismol with minimal to no relief.  He denies alcohol use and back pain.  No fever or chills.  He states that his he does have loose stools, but states this is typical for him.  No recent medication changes, melena or hematemesis.     Past Medical History:  Diagnosis Date  . Anxiety   . Bipolar 1 disorder ([place])   . Schizophrenia [facility])     Patient Active Problem List   Diagnosis Date Noted  . Schizophrenia ([place]) 06/03/2017  . Hypertension 02/19/2017  . Undifferentiated schizophrenia ([place]) 03/25/2015    History reviewed. No pertinent surgical history.      Home Medications    Prior to Admission medications   Medication Sig Start Date End Date Taking? Authorizing Provider  benztropine (COGENTIN) 0.5 MG tablet Take 1 tablet (0.5 mg total) by mouth 2 (two) times daily. For prevention of drug induced tremors 06/10/17   Lindell Spar I, NP  hydrOXYzine (ATARAX/VISTARIL) 25 MG tablet Take 1 tablet (25 mg total) by mouth every 6 (six) hours as needed for anxiety. 06/10/17   Lindell Spar I, NP  lisinopril (PRINIVIL,ZESTRIL) 5 MG tablet Take 1 tablet (5 mg total) by mouth daily. For high blood pressure 06/10/17   Lindell Spar I, NP  nicotine polacrilex (NICORETTE) 2 MG gum Take 1 each (2 mg total) by mouth as needed for smoking cessation. (May purchase from over the counter):  For smoking cessation 06/10/17   Lindell Spar I, NP  risperiDONE (RISPERDAL) 0.5 MG tablet Take 1 tablet (0.5 mg) by mouth in the morning & 2 tablets (1 mg) at bedtime: For mood control 06/10/17   Lindell Spar I, NP  traZODone (DESYREL) 100 MG tablet Take 1 tablet (100 mg total) by mouth at bedtime as needed for sleep. 06/10/17   Encarnacion Slates, NP    Family History Family History  Problem Relation Age of Onset  . Prostate cancer Paternal Grandfather   . Colon cancer Neg Hx     Social History Social History   Tobacco Use  . Smoking status: Former Smoker    Packs/day: 0.10    Types: E-cigarettes  . Smokeless tobacco: Never Used  Substance Use Topics  . Alcohol use: No    Comment: former  . Drug use: Yes    Types: Marijuana     Allergies   Patient has no known allergies.   Review of Systems Review of Systems  Constitutional: Negative for appetite change, chills and fever.  Respiratory: Negative for shortness of breath.   Cardiovascular: Negative for chest pain.  Gastrointestinal: Positive for abdominal pain, nausea and vomiting. Negative for abdominal distention and blood in stool.  Genitourinary: Negative for decreased urine volume, difficulty urinating, dysuria and flank pain.  Musculoskeletal: Negative for back pain.  Skin: Negative for color change  and rash.  Neurological: Negative for dizziness, weakness and numbness.  Hematological: Negative for adenopathy.     Physical Exam Updated Vital Signs BP 123/79 (BP Location: Left Arm)   Pulse 72   Temp 98.1 F (36.7 C) (Oral)   Resp 16   Ht 5\' 10"  (1.778 m)   Wt 102.1 kg   SpO2 99%   BMI 32.28 kg/m   Physical Exam Vitals signs and nursing note reviewed.  Constitutional:      Appearance: He is well-developed. He is not ill-appearing or toxic-appearing.  Cardiovascular:     Rate and Rhythm: Normal rate and regular rhythm.  Abdominal:     General: There is no distension.     Palpations: Abdomen is soft. There is  no mass.     Tenderness: There is abdominal tenderness. There is no left CVA tenderness or guarding.     Comments: Diffuse tenderness to palpation of the bilateral upper quadrants and epigastric region.  Abdomen soft, no cording or rebound.  No tenderness of the right lower quadrant.  Musculoskeletal: Normal range of motion.  Skin:    General: Skin is warm.     Findings: No rash.  Neurological:     General: No focal deficit present.     Mental Status: He is alert.      ED Treatments / Results  Labs (all labs ordered are listed, but only abnormal results are displayed) Labs Reviewed  COMPREHENSIVE METABOLIC PANEL - Abnormal; Notable for the following components:      Result Value   Glucose, Bld 113 (*)    Total Bilirubin 1.3 (*)    All other components within normal limits  LIPASE, BLOOD  CBC  URINALYSIS, ROUTINE W REFLEX MICROSCOPIC    EKG None  Radiology Ct Abdomen Pelvis W Contrast  Result Date: 01/31/2019 CLINICAL DATA:  33 year old male with intermittent upper abdominal pain and vomiting. EXAM: CT ABDOMEN AND PELVIS WITH CONTRAST TECHNIQUE: Multidetector CT imaging of the abdomen and pelvis was performed using the standard protocol following bolus administration of intravenous contrast. CONTRAST:  100mL OMNIPAQUE IOHEXOL 300 MG/ML  SOLN COMPARISON:  CT of the abdomen pelvis dated 10/06/2010. FINDINGS: Lower chest: The visualized lung bases are clear. No intra-abdominal free air or free fluid. Hepatobiliary: Apparent fatty infiltration of the liver. No intrahepatic biliary ductal dilatation. There are multiple stones within the gallbladder including stone within the gallbladder neck. There is no pericholecystic fluid or evidence of acute cholecystitis by CT. Ultrasound may provide better evaluation if there is high clinical concern for acute cholecystitis. Pancreas: Unremarkable. No pancreatic ductal dilatation or surrounding inflammatory changes. Spleen: Normal in size without  focal abnormality. Adrenals/Urinary Tract: The adrenal glands are unremarkable. The kidneys, visualized ureters, and urinary bladder appear unremarkable as well. Stomach/Bowel: There is moderate stool throughout the colon. There is no bowel obstruction or active inflammation. The appendix is normal. Vascular/Lymphatic: The abdominal aorta and IVC are unremarkable. No portal venous gas. There is no adenopathy. Reproductive: The prostate and seminal vesicles are grossly unremarkable. No pelvic mass. Other: Small fat containing umbilical hernia. No fluid collection. Musculoskeletal: Stable sclerotic lesion of the left femoral neck similar to the study of 2012. No acute osseous pathology. IMPRESSION: 1. Cholelithiasis without evidence of acute cholecystitis by CT. Ultrasound may provide better evaluation if there is high clinical concern for acute cholecystitis. 2. Moderate colonic stool burden. No bowel obstruction or active inflammation. Normal appendix. 3. Fatty liver. Electronically Signed   By: Ceasar MonsArash  Radparvar M.D.  On: 01/31/2019 15:52    Procedures Procedures (including critical care time)  Medications Ordered in ED Medications  ondansetron (ZOFRAN-ODT) disintegrating tablet 4 mg (has no administration in time range)  pantoprazole (PROTONIX) injection 40 mg (40 mg Intravenous Given 01/31/19 1418)  iohexol (OMNIPAQUE) 300 MG/ML solution 100 mL (100 mLs Intravenous Contrast Given 01/31/19 1533)     Initial Impression / Assessment and Plan / ED Course  I have reviewed the triage vital signs and the nursing notes.  Pertinent labs & imaging results that were available during my care of the patient were reviewed by me and considered in my medical decision making (see chart for details).        Patient with waxing and waning upper abdominal pain for several months.  Pain is been constant and associated with vomiting for several days.  No fever or chills.  Pain does not radiate into his back and  he denies alcohol use.  Findings are concerning for gallbladder disease, will obtain labs and CT abdomen pelvis.  Labs show mildly elevated total bili, otherwise wnml.  CT abd/pelvis shows gall stones w/o CT evidence of acute cholecystitis.  Pt feeling better after PPI.  I have scheduled him to return tomorrow morning at 10:00 for [place] of abdomen to further evaluate.  Pt agrees to plan, appears appropriate for d/c home.  Referral info provided for gen surgery.    Final Clinical Impressions(s) / ED Diagnoses   Final diagnoses:  Pain of upper abdomen    ED Discharge Orders    None       Pauline Aus, PA-C 01/31/19 1641    Terrilee Files, MD 01/31/19 1711

## 2019-01-31 NOTE — ED Notes (Signed)
Reports abd pain x 1 year intermittently Has seen noone  For same   This event for the last 5 days with upper abd pain denies thru to back  States vomit x 2 today  D yesterday   Ambulates erect Appears anxious

## 2019-01-31 NOTE — ED Notes (Signed)
Awaiting CT

## 2019-02-01 ENCOUNTER — Observation Stay (HOSPITAL_COMMUNITY)
Admission: EM | Admit: 2019-02-01 | Discharge: 2019-02-03 | Disposition: A | Discharge status: Home / Self Care | Payer: Medicaid Other | Attending: General Surgery | Admitting: General Surgery

## 2019-02-01 ENCOUNTER — Ambulatory Visit (HOSPITAL_COMMUNITY)
Admission: RE | Admit: 2019-02-01 | Discharge: 2019-02-01 | Disposition: A | Discharge status: Home / Self Care | Payer: Medicaid Other | Source: Ambulatory Visit | Attending: Emergency Medicine | Admitting: Emergency Medicine

## 2019-02-01 ENCOUNTER — Encounter (HOSPITAL_COMMUNITY): Payer: Self-pay

## 2019-02-01 ENCOUNTER — Other Ambulatory Visit: Payer: Self-pay

## 2019-02-01 DIAGNOSIS — K810 Acute cholecystitis: Secondary | ICD-10-CM | POA: Diagnosis present

## 2019-02-01 DIAGNOSIS — Z79899 Other long term (current) drug therapy: Secondary | ICD-10-CM | POA: Diagnosis not present

## 2019-02-01 DIAGNOSIS — F319 Bipolar disorder, unspecified: Secondary | ICD-10-CM | POA: Insufficient documentation

## 2019-02-01 DIAGNOSIS — K8012 Calculus of gallbladder with acute and chronic cholecystitis without obstruction: Secondary | ICD-10-CM | POA: Diagnosis not present

## 2019-02-01 DIAGNOSIS — Z888 Allergy status to other drugs, medicaments and biological substances status: Secondary | ICD-10-CM | POA: Insufficient documentation

## 2019-02-01 DIAGNOSIS — F419 Anxiety disorder, unspecified: Secondary | ICD-10-CM | POA: Diagnosis not present

## 2019-02-01 DIAGNOSIS — F203 Undifferentiated schizophrenia: Secondary | ICD-10-CM | POA: Diagnosis not present

## 2019-02-01 DIAGNOSIS — Z20828 Contact with and (suspected) exposure to other viral communicable diseases: Secondary | ICD-10-CM | POA: Insufficient documentation

## 2019-02-01 DIAGNOSIS — I10 Essential (primary) hypertension: Secondary | ICD-10-CM | POA: Diagnosis not present

## 2019-02-01 DIAGNOSIS — Z87891 Personal history of nicotine dependence: Secondary | ICD-10-CM | POA: Diagnosis not present

## 2019-02-01 DIAGNOSIS — K8020 Calculus of gallbladder without cholecystitis without obstruction: Secondary | ICD-10-CM | POA: Insufficient documentation

## 2019-02-01 DIAGNOSIS — R109 Unspecified abdominal pain: Secondary | ICD-10-CM | POA: Diagnosis present

## 2019-02-01 DIAGNOSIS — R1011 Right upper quadrant pain: Secondary | ICD-10-CM | POA: Insufficient documentation

## 2019-02-01 LAB — COMPREHENSIVE METABOLIC PANEL
ALT: 20 U/L (ref 0–44)
AST: 19 U/L (ref 15–41)
Albumin: 4.3 g/dL (ref 3.5–5.0)
Alkaline Phosphatase: 69 U/L (ref 38–126)
Anion gap: 9 (ref 5–15)
BUN: 8 mg/dL (ref 6–20)
CO2: 25 mmol/L (ref 22–32)
Calcium: 8.7 mg/dL — ABNORMAL LOW (ref 8.9–10.3)
Chloride: 103 mmol/L (ref 98–111)
Creatinine, Ser: 0.98 mg/dL (ref 0.61–1.24)
GFR calc Af Amer: >60 mL/min (ref >60)
GFR calc non Af Amer: >60 mL/min (ref >60)
Glucose, Bld: 95 mg/dL (ref 70–99)
Potassium: 4 mmol/L (ref 3.5–5.1)
Sodium: 137 mmol/L (ref 135–145)
Total Bilirubin: 1.9 mg/dL — ABNORMAL HIGH (ref 0.3–1.2)
Total Protein: 7 g/dL (ref 6.5–8.1)

## 2019-02-01 LAB — CBC WITH DIFFERENTIAL/PLATELET
Abs Immature Granulocytes: 0.01 10*3/uL (ref 0.00–0.07)
Basophils Absolute: 0.1 10*3/uL (ref 0.0–0.1)
Basophils Relative: 1 %
Eosinophils Absolute: 0.2 10*3/uL (ref 0.0–0.5)
Eosinophils Relative: 2 %
HCT: 42.2 % (ref 39.0–52.0)
Hemoglobin: 14 g/dL (ref 13.0–17.0)
Immature Granulocytes: 0 %
Lymphocytes Relative: 28 %
Lymphs Abs: 1.9 10*3/uL (ref 0.7–4.0)
MCH: 29.5 pg (ref 26.0–34.0)
MCHC: 33.2 g/dL (ref 30.0–36.0)
MCV: 89 fL (ref 80.0–100.0)
Monocytes Absolute: 0.5 10*3/uL (ref 0.1–1.0)
Monocytes Relative: 7 %
Neutro Abs: 4.2 10*3/uL (ref 1.7–7.7)
Neutrophils Relative %: 62 %
Platelets: 275 10*3/uL (ref 150–400)
RBC: 4.74 MIL/uL (ref 4.22–5.81)
RDW: 13.2 % (ref 11.5–15.5)
WBC: 6.8 10*3/uL (ref 4.0–10.5)
nRBC: 0 % (ref 0.0–0.2)

## 2019-02-01 LAB — SURGICAL PCR SCREEN
MRSA, PCR: NEGATIVE
Staphylococcus aureus: POSITIVE — AB

## 2019-02-01 LAB — HIV ANTIBODY (ROUTINE TESTING W REFLEX): HIV Screen 4th Generation wRfx: NONREACTIVE

## 2019-02-01 MED ORDER — SODIUM CHLORIDE 0.9 % IV SOLN
1.0000 g | Freq: Once | INTRAVENOUS | Status: AC
  Administered 2019-02-01: 1 g via INTRAVENOUS
  Filled 2019-02-01: qty 10

## 2019-02-01 MED ORDER — SODIUM CHLORIDE 0.9 % IV SOLN: 1.0000 g | INTRAVENOUS | Status: DC

## 2019-02-01 MED ORDER — RISPERIDONE 1 MG PO TABS: 0.5000 mg | ORAL_TABLET | ORAL | Status: DC

## 2019-02-01 MED ORDER — ONDANSETRON HCL 4 MG/2ML IJ SOLN
4.0000 mg | Freq: Once | INTRAMUSCULAR | Status: AC
  Administered 2019-02-01: 4 mg via INTRAVENOUS
  Filled 2019-02-01: qty 2

## 2019-02-01 MED ORDER — HYDROXYZINE HCL 25 MG PO TABS
50.0000 mg | ORAL_TABLET | Freq: Three times a day (TID) | ORAL | Status: DC | PRN
  Administered 2019-02-02: 50 mg via ORAL
  Filled 2019-02-01 (×2): qty 2

## 2019-02-01 MED ORDER — ACETAMINOPHEN 650 MG RE SUPP: 650.0000 mg | Freq: Four times a day (QID) | RECTAL | Status: DC | PRN

## 2019-02-01 MED ORDER — RISPERIDONE 1 MG PO TABS
1.0000 mg | ORAL_TABLET | Freq: Every day | ORAL | Status: DC
  Administered 2019-02-01 – 2019-02-02 (×2): 1 mg via ORAL
  Filled 2019-02-01 (×2): qty 1

## 2019-02-01 MED ORDER — RISPERIDONE 1 MG PO TABS
0.5000 mg | ORAL_TABLET | Freq: Every day | ORAL | Status: DC
  Administered 2019-02-03: 0.5 mg via ORAL
  Filled 2019-02-01 (×2): qty 1

## 2019-02-01 MED ORDER — HYDROCODONE-ACETAMINOPHEN 5-325 MG PO TABS
1.0000 | ORAL_TABLET | ORAL | Status: DC | PRN
  Administered 2019-02-01 (×2): 1 via ORAL
  Filled 2019-02-01: qty 1
  Filled 2019-02-01: qty 2

## 2019-02-01 MED ORDER — SODIUM CHLORIDE 0.9% FLUSH
3.0000 mL | Freq: Two times a day (BID) | INTRAVENOUS | Status: DC
  Administered 2019-02-01 – 2019-02-03 (×4): 3 mL via INTRAVENOUS

## 2019-02-01 MED ORDER — ACETAMINOPHEN 325 MG PO TABS: 650.0000 mg | ORAL_TABLET | Freq: Four times a day (QID) | ORAL | Status: DC | PRN

## 2019-02-01 MED ORDER — ONDANSETRON HCL 4 MG/2ML IJ SOLN: 4.0000 mg | Freq: Four times a day (QID) | INTRAMUSCULAR | Status: DC | PRN

## 2019-02-01 MED ORDER — SODIUM CHLORIDE 0.9 % IV SOLN: 2.0000 g | INTRAVENOUS | Status: DC

## 2019-02-01 MED ORDER — SODIUM CHLORIDE 0.9% FLUSH: 3.0000 mL | INTRAVENOUS | Status: DC | PRN

## 2019-02-01 MED ORDER — TRAZODONE HCL 50 MG PO TABS: 100.0000 mg | ORAL_TABLET | Freq: Every evening | ORAL | Status: DC | PRN

## 2019-02-01 MED ORDER — ONDANSETRON HCL 4 MG PO TABS: 4.0000 mg | ORAL_TABLET | Freq: Four times a day (QID) | ORAL | Status: DC | PRN

## 2019-02-01 MED ORDER — BENZTROPINE MESYLATE 1 MG PO TABS
0.5000 mg | ORAL_TABLET | Freq: Two times a day (BID) | ORAL | Status: DC
  Administered 2019-02-01 – 2019-02-03 (×4): 0.5 mg via ORAL
  Filled 2019-02-01 (×5): qty 1

## 2019-02-01 MED ORDER — SODIUM CHLORIDE 0.9 % IV SOLN
250.0000 mL | INTRAVENOUS | Status: DC | PRN
  Administered 2019-02-01: 250 mL via INTRAVENOUS

## 2019-02-01 MED ORDER — INFLUENZA VAC SPLIT QUAD 0.5 ML IM SUSY
0.5000 mL | PREFILLED_SYRINGE | INTRAMUSCULAR | Status: DC
  Filled 2019-02-01: qty 0.5

## 2019-02-01 MED ORDER — CHLORHEXIDINE GLUCONATE CLOTH 2 % EX PADS
6.0000 | MEDICATED_PAD | Freq: Once | CUTANEOUS | Status: AC
  Administered 2019-02-02: 6 via TOPICAL

## 2019-02-01 MED ORDER — CHLORHEXIDINE GLUCONATE CLOTH 2 % EX PADS
6.0000 | MEDICATED_PAD | Freq: Once | CUTANEOUS | Status: AC
  Administered 2019-02-01: 6 via TOPICAL

## 2019-02-01 MED ORDER — CEFTRIAXONE SODIUM 1 G IJ SOLR
1.0000 g | Freq: Once | INTRAMUSCULAR | Status: DC
  Filled 2019-02-01: qty 10

## 2019-02-01 MED ORDER — FENTANYL CITRATE (PF) 100 MCG/2ML IJ SOLN: 25.0000 ug | INTRAMUSCULAR | Status: DC | PRN

## 2019-02-01 NOTE — ED Notes (Signed)
Awaiting bed assignment   NAD- parent w patient hospitalist has seen   SWAT RN has completed admission questions

## 2019-02-01 NOTE — ED Notes (Signed)
Lab in room.

## 2019-02-01 NOTE — ED Notes (Signed)
Here yesterday for RUQ pain   Went to [place] this am with dx of acute cholecystitis   Awaiting eval

## 2019-02-01 NOTE — ED Triage Notes (Signed)
Pt had [place] today and was sent from [place] here for concern for acute cholecystitis. Pt with ongoing upper abdominal pain, nausea and vomiting.

## 2019-02-01 NOTE — Progress Notes (Signed)
[facility]  Continued pain and nausea. [place] with concern for acute cholecystitis.   Hospitalist admit. Will take over tomorrow. Ceftriaxone for cholecystitis. Clear liquids now, NPO midnight. Will plan for Lap cholecystectomy tomorrow ED ordering COVID testing Repeat LFTs today.   Appreciate everyone assistance.  Curlene Labrum, MD [facility] [address] [place], [place] [address] [phone]/ [phone] (office)

## 2019-02-01 NOTE — ED Notes (Signed)
Pharm tech in   Per mother and pt , pt had a negative covid obtained at [facility] in [place] 2 weeks ago

## 2019-02-01 NOTE — H&P (Signed)
History and Physical  [name] MWU:[id number] DOB: [birth year] DOA: 02/01/2019  Referring physician: Particia Nearing MD   PCP: Patient, No Pcp Per   Chief Complaint: abdominal pain   HPI: Gilbert Meyers is a 33 y.o. male presented to the ED yesterday complaining of abdominal pain associated with nausea and vomiting that has been intermittently bothering him for the last several months.  He reported that the pain had become more intense and it had been constant over the last several days.  It was worsened with certain meals.  He reported that he was concerned because the symptoms seem to be getting worse over the past several days.  He has been started on a course of oral Protonix and hydrocodone for the pain and reported that it did improve his symptoms.  He reports that he continues to have nausea and poor appetite and has had some moderate weight loss over the last few weeks.  He was called back into the ED to have an ultrasound of his abdomen completed as they noted that his CT scan was concerning for possible acute cholecystitis.  Patient returned and was evaluated with a Doppler abdominal ultrasound which was positive for acute cholecystitis.  General surgery was also consulted and recommended that he be admitted started on clear liquids and n.p.o. after midnight with IV ceftriaxone.  Planning for tentative cholecystectomy tomorrow.  Review of Systems: All systems reviewed and apart from history of presenting illness, are negative.  Past Medical History:  Diagnosis Date  . Anxiety   . Bipolar 1 disorder (HCC)   . Schizophrenia (HCC)    History reviewed. No pertinent surgical history. Social History:  reports that he has quit smoking. His smoking use included e-cigarettes. He smoked 0.10 packs per day. He has never used smokeless tobacco. He reports current drug use. Drug: Marijuana. He reports that he does not drink alcohol.  Allergies  Allergen Reactions  . Cymbalta [Duloxetine Hcl]     "makes him feel awful"    Family History  Problem Relation Age of Onset  . Prostate cancer Paternal Grandfather   . Colon cancer Neg Hx     Prior to Admission medications   Medication Sig Start Date End Date Taking? Authorizing Provider  benztropine (COGENTIN) 0.5 MG tablet Take 1 tablet (0.5 mg total) by mouth 2 (two) times daily. For prevention of drug induced tremors 06/10/17  Yes Armandina Stammer I, NP  hydrOXYzine (ATARAX/VISTARIL) 50 MG tablet Take 50 mg by mouth 3 (three) times daily as needed for anxiety.   Yes [provider]  risperiDONE (RISPERDAL) 0.5 MG tablet Take 1 tablet (0.5 mg) by mouth in the morning & 2 tablets (1 mg) at bedtime: For mood control 06/10/17  Yes Armandina Stammer I, NP  traZODone (DESYREL) 100 MG tablet Take 1 tablet (100 mg total) by mouth at bedtime as needed for sleep. 06/10/17  Yes Armandina Stammer I, NP  hydrOXYzine (ATARAX/VISTARIL) 25 MG tablet Take 1 tablet (25 mg total) by mouth every 6 (six) hours as needed for anxiety. 06/10/17   Armandina Stammer I, NP   Physical Exam: Vitals:   02/01/19 1121 02/01/19 1122 02/01/19 1146 02/01/19 1200  BP: 127/72  (!) 126/114 114/61  Pulse: 81  67 60  Resp: 16     Temp: 98.3 F (36.8 C)     TempSrc: Oral     SpO2: 99%  98% 98%  Weight:  102.1 kg    Height:  5\' 10"  (1.778 m)  General exam: Moderately built and nourished patient, lying comfortably supine on the gurney in no obvious distress.  Head, eyes and ENT: Nontraumatic and normocephalic. Pupils equally reacting to light and accommodation. Oral mucosa moist.  Neck: Supple. No JVD, carotid bruit or thyromegaly.  Lymphatics: No lymphadenopathy.  Respiratory system: Clear to auscultation. No increased work of breathing.  Cardiovascular system: S1 and S2 heard, RRR. No JVD, murmurs, gallops, clicks or pedal edema.  Gastrointestinal system: Abdomen is nondistended, soft and diffuse RUQ tenderness with guarding. Normal bowel sounds heard. No organomegaly or  masses appreciated.  Central nervous system: Alert and oriented. No focal neurological deficits.  Extremities: Symmetric 5 x 5 power. Peripheral pulses symmetrically felt.   Skin: No rashes or acute findings.  Musculoskeletal system: Negative exam.  Psychiatry: Pleasant and cooperative.  Labs on Admission:  Basic Metabolic Panel: Recent Labs  Lab 01/31/19 1242  NA 137  K 4.0  CL 103  CO2 24  GLUCOSE 113*  BUN 12  CREATININE 0.96  CALCIUM 9.1   Liver Function Tests: Recent Labs  Lab 01/31/19 1242  AST 22  ALT 24  ALKPHOS 77  BILITOT 1.3*  PROT 7.6  ALBUMIN 4.7   Recent Labs  Lab 01/31/19 1242  LIPASE 30   No results for input(s): AMMONIA in the last 168 hours. CBC: Recent Labs  Lab 01/31/19 1242  WBC 10.4  HGB 14.6  HCT 44.1  MCV 88.2  PLT 284   Cardiac Enzymes: No results for input(s): CKTOTAL, CKMB, CKMBINDEX, TROPONINI in the last 168 hours.  BNP (last 3 results) No results for input(s): PROBNP in the last 8760 hours. CBG: No results for input(s): GLUCAP in the last 168 hours.  Radiological Exams on Admission: Ct Abdomen Pelvis W Contrast  Result Date: 01/31/2019 CLINICAL DATA:  33 year old male with intermittent upper abdominal pain and vomiting. EXAM: CT ABDOMEN AND PELVIS WITH CONTRAST TECHNIQUE: Multidetector CT imaging of the abdomen and pelvis was performed using the standard protocol following bolus administration of intravenous contrast. CONTRAST:  100mL OMNIPAQUE IOHEXOL 300 MG/ML  SOLN COMPARISON:  CT of the abdomen pelvis dated 10/06/2010. FINDINGS: Lower chest: The visualized lung bases are clear. No intra-abdominal free air or free fluid. Hepatobiliary: Apparent fatty infiltration of the liver. No intrahepatic biliary ductal dilatation. There are multiple stones within the gallbladder including stone within the gallbladder neck. There is no pericholecystic fluid or evidence of acute cholecystitis by CT. Ultrasound may provide better  evaluation if there is high clinical concern for acute cholecystitis. Pancreas: Unremarkable. No pancreatic ductal dilatation or surrounding inflammatory changes. Spleen: Normal in size without focal abnormality. Adrenals/Urinary Tract: The adrenal glands are unremarkable. The kidneys, visualized ureters, and urinary bladder appear unremarkable as well. Stomach/Bowel: There is moderate stool throughout the colon. There is no bowel obstruction or active inflammation. The appendix is normal. Vascular/Lymphatic: The abdominal aorta and IVC are unremarkable. No portal venous gas. There is no adenopathy. Reproductive: The prostate and seminal vesicles are grossly unremarkable. No pelvic mass. Other: Small fat containing umbilical hernia. No fluid collection. Musculoskeletal: Stable sclerotic lesion of the left femoral neck similar to the study of 2012. No acute osseous pathology. IMPRESSION: 1. Cholelithiasis without evidence of acute cholecystitis by CT. Ultrasound may provide better evaluation if there is high clinical concern for acute cholecystitis. 2. Moderate colonic stool burden. No bowel obstruction or active inflammation. Normal appendix. 3. Fatty liver. Electronically Signed   By: Elgie CollardArash  Radparvar M.D.   On: 01/31/2019 15:52  US Abdomen Limited Ruq/gall Gladder  Result Date: 02/01/2019 CLINICAL DATA:  Upper abdominal pain EXAM: ULTRASOUND ABDOMEN LIMITED RIGHT UPPER QUADRANT COMPARISON:  CT abdomen and pelvis January 31, 2019 FINDINGS: Gallbladder: Within the gallbladder, there are echogenic foci which move and shadow consistent with cholelithiasis. Largest individual gallstone measures 7 mm. Two gallbladder wall appears thickened and somewhat edematous. There is no appreciable pericholecystic fluid. No sonographic Murphy sign noted by sonographer. Common bile duct: Diameter: 5 mm. No intrahepatic or extrahepatic biliary duct dilatation. Liver: No focal lesion identified. Liver echogenicity overall  mildly increased. Portal vein is patent on color Doppler imaging with normal direction of blood flow towards the liver. Other: None. IMPRESSION: 1. Cholelithiasis with gallbladder wall thickening. Concern for a degree of acute cholecystitis. This finding may warrant correlation with nuclear medicine hepatobiliary imaging study to assess for cystic duct patency. 2. Suspect a degree of hepatic steatosis. No focal liver lesions evident. Electronically Signed   By: Lowella Grip III M.D.   On: 02/01/2019 10:53   Assessment/Plan Principal Problem:   Acute cholecystitis Active Problems:   Undifferentiated schizophrenia ([place])   Hypertension  1. Acute cholecystitis - Pt is currently hemodynamically stable.  He will be admitted to a MedSurg bed.  He will be started on clear liquids and he will be made n.p.o. after midnight.  IV pain and nausea medications have been ordered.  General surgery consultation.  Follow LFTs repeat in a.m. 2. Schizophrenia-continue his home medications. 3. Essential hypertension -diet and exercise controlled he is currently not on medications for this  DVT Prophylaxis: heparin Code Status: full   Family Communication:   Disposition Plan: observation    Time spent: 18 minutes    Wynetta Emery, MD [facility] How to contact the [facility] Attending or Consulting provider [place] or covering provider during after hours [place], for this patient?  1. Check the care team in [facility] and look for a) attending/consulting [facility] provider listed and b) the [facility] team listed 2. Log into [email or web address] and use [place]'s universal password to access. If you do not have the password, please contact the hospital operator. 3. Locate the [facility] provider you are looking for under [facility] and page to a number that you can be directly reached. 4. If you still have difficulty reaching the provider, please page the [facility] (Director on Call) for the Hospitalists listed on amion for  assistance.

## 2019-02-01 NOTE — ED Provider Notes (Signed)
[facility]GENCY DEPARTMENT Provider Note   CSN: [id number] Arrival date & time: 02/01/19  1116     History   Chief Complaint Chief Complaint  [facility]   [facility]    HPI Gilbert GrahamJeremy Meyers is a 33 y.o. male.     HPI   Gilbert Meyers is a 33 y.o. male who returns to the emergency department today after having an outpatient ultrasound of his gallbladder.  Patient was seen by me yesterday as well.  He has been having upper abdominal pain with nausea and vomiting intermittently for several months.  Pain is became more constant and intense for several days.  He was started on Protonix and a short course of hydrocodone yesterday which she says helps the pain somewhat, but he continues to have significant nausea.  He denies vomiting today, no fever, no diarrhea.  Last meal was mashed potatoes at 9 PM last evening.  Past Medical History:  Diagnosis Date   Anxiety    Bipolar 1 disorder (HCC)    Schizophrenia [facility](HCC)     Patient Active Problem List   Diagnosis Date Noted   Schizophrenia (HCC) 06/03/2017   Hypertension 02/19/2017   Undifferentiated schizophrenia (HCC) 03/25/2015    History reviewed. No pertinent surgical history.      Home Medications    Prior to Admission medications   Medication Sig Start Date End Date Taking? Authorizing Provider  benztropine (COGENTIN) 0.5 MG tablet Take 1 tablet (0.5 mg total) by mouth 2 (two) times daily. For prevention of drug induced tremors 06/10/17   Armandina StammerNwoko, Agnes I, NP  HYDROcodone-acetaminophen (NORCO/VICODIN) 5-325 MG tablet Take one tab po q 4 hrs prn pain 01/31/19   Karmello Abercrombie, PA-C  hydrOXYzine (ATARAX/VISTARIL) 25 MG tablet Take 1 tablet (25 mg total) by mouth every 6 (six) hours as needed for anxiety. 06/10/17   Armandina StammerNwoko, Agnes I, NP  lisinopril (PRINIVIL,ZESTRIL) 5 MG tablet Take 1 tablet (5 mg total) by mouth daily. For high blood pressure 06/10/17   Armandina StammerNwoko, Agnes I, NP  nicotine polacrilex (NICORETTE) 2  MG gum Take 1 each (2 mg total) by mouth as needed for smoking cessation. (May purchase from over the counter): For smoking cessation 06/10/17   Armandina StammerNwoko, Agnes I, NP  pantoprazole (PROTONIX) 20 MG tablet Take 1 tablet (20 mg total) by mouth daily. Take before breakfast 01/31/19   Wrenna Saks, PA-C  risperiDONE (RISPERDAL) 0.5 MG tablet Take 1 tablet (0.5 mg) by mouth in the morning & 2 tablets (1 mg) at bedtime: For mood control 06/10/17   Armandina StammerNwoko, Agnes I, NP  traZODone (DESYREL) 100 MG tablet Take 1 tablet (100 mg total) by mouth at bedtime as needed for sleep. 06/10/17   Sanjuana KavaNwoko, Agnes I, NP    Family History Family History  Problem Relation Age of Onset   Prostate cancer Paternal Grandfather    Colon cancer Neg Hx     Social History Social History   Tobacco Use   Smoking status: Former Smoker    Packs/day: 0.10    Types: E-cigarettes   Smokeless tobacco: Never Used  Substance Use Topics   Alcohol use: No    Comment: former   Drug use: Yes    Types: Marijuana     Allergies   Patient has no known allergies.   Review of Systems Review of Systems  Constitutional: Negative for appetite change, chills and fever.  Respiratory: Negative for shortness of breath.   Cardiovascular: Negative for chest pain.  Gastrointestinal:  Positive for abdominal pain, nausea and vomiting. Negative for abdominal distention, blood in stool and diarrhea.  Genitourinary: Negative for decreased urine volume, difficulty urinating, dysuria and flank pain.  Musculoskeletal: Negative for back pain.  Skin: Negative for color change and rash.  Neurological: Negative for dizziness, weakness and numbness.  Hematological: Negative for adenopathy.     Physical Exam Updated Vital Signs BP 127/72 (BP Location: Right Arm)    Pulse 81    Temp 98.3 F (36.8 C) (Oral)    Resp 16    Ht 5\' 10"  (1.778 m)    Wt 102.1 kg    SpO2 99%    BMI 32.28 kg/m   Physical Exam Vitals signs and nursing note reviewed.    Constitutional:      General: He is not in acute distress.    Appearance: He is well-developed. He is not ill-appearing.  HENT:     Head: Normocephalic.     Mouth/Throat:     Mouth: Mucous membranes are moist.  Cardiovascular:     Rate and Rhythm: Normal rate and regular rhythm.     Heart sounds: Normal heart sounds. No murmur.  Pulmonary:     Effort: Pulmonary effort is normal. No respiratory distress.     Breath sounds: Normal breath sounds.  Abdominal:     General: Bowel sounds are normal. There is no distension.     Palpations: Abdomen is soft. There is no mass.     Tenderness: There is abdominal tenderness in the epigastric area. There is no guarding or rebound.     Comments: Diffuse tenderness along the upper abdomen.  No guarding or rebound.  No abdominal distention, no peritoneal signs  Musculoskeletal: Normal range of motion.  Skin:    General: Skin is warm.     Findings: No rash.  Neurological:     Mental Status: He is alert and oriented to person, place, and time.     Motor: No abnormal muscle tone.     Coordination: Coordination normal.      ED Treatments / Results  Labs (all labs ordered are listed, but only abnormal results are displayed) Labs Reviewed  SARS CORONAVIRUS 2 (TAT 6-24 HRS)  COMPREHENSIVE METABOLIC PANEL  CBC WITH DIFFERENTIAL/PLATELET      EKG None  Radiology Ct Abdomen Pelvis W Contrast  Result Date: 01/31/2019 CLINICAL DATA:  33 year old male with intermittent upper abdominal pain and vomiting. EXAM: CT ABDOMEN AND PELVIS WITH CONTRAST TECHNIQUE: Multidetector CT imaging of the abdomen and pelvis was performed using the standard protocol following bolus administration of intravenous contrast. CONTRAST:  16mL OMNIPAQUE IOHEXOL 300 MG/ML  SOLN COMPARISON:  CT of the abdomen pelvis dated 10/06/2010. FINDINGS: Lower chest: The visualized lung bases are clear. No intra-abdominal free air or free fluid. Hepatobiliary: Apparent fatty  infiltration of the liver. No intrahepatic biliary ductal dilatation. There are multiple stones within the gallbladder including stone within the gallbladder neck. There is no pericholecystic fluid or evidence of acute cholecystitis by CT. Ultrasound may provide better evaluation if there is high clinical concern for acute cholecystitis. Pancreas: Unremarkable. No pancreatic ductal dilatation or surrounding inflammatory changes. Spleen: Normal in size without focal abnormality. Adrenals/Urinary Tract: The adrenal glands are unremarkable. The kidneys, visualized ureters, and urinary bladder appear unremarkable as well. Stomach/Bowel: There is moderate stool throughout the colon. There is no bowel obstruction or active inflammation. The appendix is normal. Vascular/Lymphatic: The abdominal aorta and IVC are unremarkable. No portal venous gas. There is no  adenopathy. Reproductive: The prostate and seminal vesicles are grossly unremarkable. No pelvic mass. Other: Small fat containing umbilical hernia. No fluid collection. Musculoskeletal: Stable sclerotic lesion of the left femoral neck similar to the study of 2012. No acute osseous pathology. IMPRESSION: 1. Cholelithiasis without evidence of acute cholecystitis by CT. Ultrasound may provide better evaluation if there is high clinical concern for acute cholecystitis. 2. Moderate colonic stool burden. No bowel obstruction or active inflammation. Normal appendix. 3. Fatty liver. Electronically Signed   By: Elgie Collard M.D.   On: 01/31/2019 15:52   US Abdomen Limited Ruq/gall Gladder  Result Date: 02/01/2019 CLINICAL DATA:  Upper abdominal pain EXAM: ULTRASOUND ABDOMEN LIMITED RIGHT UPPER QUADRANT COMPARISON:  CT abdomen and pelvis January 31, 2019 FINDINGS: Gallbladder: Within the gallbladder, there are echogenic foci which move and shadow consistent with cholelithiasis. Largest individual gallstone measures 7 mm. Two gallbladder wall appears thickened and  somewhat edematous. There is no appreciable pericholecystic fluid. No sonographic Murphy sign noted by sonographer. Common bile duct: Diameter: 5 mm. No intrahepatic or extrahepatic biliary duct dilatation. Liver: No focal lesion identified. Liver echogenicity overall mildly increased. Portal vein is patent on color Doppler imaging with normal direction of blood flow towards the liver. Other: None. IMPRESSION: 1. Cholelithiasis with gallbladder wall thickening. Concern for a degree of acute cholecystitis. This finding may warrant correlation with nuclear medicine hepatobiliary imaging study to assess for cystic duct patency. 2. Suspect a degree of hepatic steatosis. No focal liver lesions evident. Electronically Signed   By: Bretta Bang III M.D.   On: 02/01/2019 10:53    Procedures Procedures (including critical care time)  Medications Ordered in ED Medications - No data to display   Initial Impression / Assessment and Plan / ED Course  I have reviewed the triage vital signs and the nursing notes.  Pertinent labs & imaging results that were available during my care of the patient were reviewed by me and considered in my medical decision making (see chart for details).        1145  Consulted general surgery, Dr. Henreitta Leber and discussed findings.  She recommends admit today for cholecystectomy tomorrow.  Clear liquids today, NPO after midnight, ceftriaxone and repeat LFTs.  I will consult hospitalist for admission.  Spoke with [facility], Tim to order the rapid COVID, I was informed that since pt is having surgery tomorrow, that falls outside the 8 hour window for ordering the rapid 2 hr PCR test.    1215 spoke with hospitalist, Dr. Laural Benes, who agrees to admit.    Final Clinical Impressions(s) / ED Diagnoses   Final diagnoses:  Acute cholecystitis    ED Discharge Orders    None       Pauline Aus, PA-C 02/01/19 1226    Jacalyn Lefevre, MD 02/01/19 1520

## 2019-02-01 NOTE — ED Notes (Signed)
Awaiting bed assignment.

## 2019-02-01 NOTE — ED Notes (Signed)
Report to Lisa, RN

## 2019-02-02 ENCOUNTER — Encounter (HOSPITAL_COMMUNITY)
Admission: EM | Disposition: A | Discharge status: Home / Self Care | Payer: Self-pay | Source: Home / Self Care | Attending: Emergency Medicine

## 2019-02-02 ENCOUNTER — Observation Stay (HOSPITAL_COMMUNITY): Payer: Medicaid Other | Admitting: Anesthesiology

## 2019-02-02 ENCOUNTER — Encounter (HOSPITAL_COMMUNITY): Payer: Self-pay | Admitting: *Deleted

## 2019-02-02 DIAGNOSIS — F319 Bipolar disorder, unspecified: Secondary | ICD-10-CM | POA: Diagnosis not present

## 2019-02-02 DIAGNOSIS — K810 Acute cholecystitis: Secondary | ICD-10-CM

## 2019-02-02 DIAGNOSIS — K8012 Calculus of gallbladder with acute and chronic cholecystitis without obstruction: Secondary | ICD-10-CM | POA: Diagnosis not present

## 2019-02-02 DIAGNOSIS — Z20828 Contact with and (suspected) exposure to other viral communicable diseases: Secondary | ICD-10-CM | POA: Diagnosis not present

## 2019-02-02 DIAGNOSIS — F419 Anxiety disorder, unspecified: Secondary | ICD-10-CM | POA: Diagnosis not present

## 2019-02-02 HISTORY — PX: CHOLECYSTECTOMY: SHX55

## 2019-02-02 LAB — CBC WITH DIFFERENTIAL/PLATELET
Abs Immature Granulocytes: 0.01 10*3/uL (ref 0.00–0.07)
Basophils Absolute: 0.1 10*3/uL (ref 0.0–0.1)
Basophils Relative: 1 %
Eosinophils Absolute: 0.3 10*3/uL (ref 0.0–0.5)
Eosinophils Relative: 3 %
HCT: 42.6 % (ref 39.0–52.0)
Hemoglobin: 14.2 g/dL (ref 13.0–17.0)
Immature Granulocytes: 0 %
Lymphocytes Relative: 37 %
Lymphs Abs: 3.3 10*3/uL (ref 0.7–4.0)
MCH: 29.5 pg (ref 26.0–34.0)
MCHC: 33.3 g/dL (ref 30.0–36.0)
MCV: 88.6 fL (ref 80.0–100.0)
Monocytes Absolute: 0.6 10*3/uL (ref 0.1–1.0)
Monocytes Relative: 6 %
Neutro Abs: 4.9 10*3/uL (ref 1.7–7.7)
Neutrophils Relative %: 53 %
Platelets: 284 10*3/uL (ref 150–400)
RBC: 4.81 MIL/uL (ref 4.22–5.81)
RDW: 13.3 % (ref 11.5–15.5)
WBC: 9.1 10*3/uL (ref 4.0–10.5)
nRBC: 0 % (ref 0.0–0.2)

## 2019-02-02 LAB — COMPREHENSIVE METABOLIC PANEL
ALT: 19 U/L (ref 0–44)
AST: 17 U/L (ref 15–41)
Albumin: 4.2 g/dL (ref 3.5–5.0)
Alkaline Phosphatase: 64 U/L (ref 38–126)
Anion gap: 11 (ref 5–15)
BUN: 8 mg/dL (ref 6–20)
CO2: 24 mmol/L (ref 22–32)
Calcium: 8.8 mg/dL — ABNORMAL LOW (ref 8.9–10.3)
Chloride: 104 mmol/L (ref 98–111)
Creatinine, Ser: 0.95 mg/dL (ref 0.61–1.24)
GFR calc Af Amer: >60 mL/min (ref >60)
GFR calc non Af Amer: >60 mL/min (ref >60)
Glucose, Bld: 90 mg/dL (ref 70–99)
Potassium: 3.8 mmol/L (ref 3.5–5.1)
Sodium: 139 mmol/L (ref 135–145)
Total Bilirubin: 1.6 mg/dL — ABNORMAL HIGH (ref 0.3–1.2)
Total Protein: 6.9 g/dL (ref 6.5–8.1)

## 2019-02-02 LAB — MAGNESIUM: Magnesium: 2.4 mg/dL (ref 1.7–2.4)

## 2019-02-02 LAB — SARS CORONAVIRUS 2 (TAT 6-24 HRS): SARS Coronavirus 2: NEGATIVE

## 2019-02-02 SURGERY — LAPAROSCOPIC CHOLECYSTECTOMY
Anesthesia: General

## 2019-02-02 MED ORDER — DEXAMETHASONE SODIUM PHOSPHATE 10 MG/ML IJ SOLN
INTRAMUSCULAR | Status: AC
  Filled 2019-02-02: qty 1

## 2019-02-02 MED ORDER — MIDAZOLAM HCL 2 MG/2ML IJ SOLN: 0.5000 mg | Freq: Once | INTRAMUSCULAR | Status: DC | PRN

## 2019-02-02 MED ORDER — LIDOCAINE 2% (20 MG/ML) 5 ML SYRINGE
INTRAMUSCULAR | Status: AC
  Filled 2019-02-02: qty 5

## 2019-02-02 MED ORDER — LACTATED RINGERS IV SOLN
INTRAVENOUS | Status: DC
  Administered 2019-02-02: 1000 mL via INTRAVENOUS

## 2019-02-02 MED ORDER — ONDANSETRON HCL 4 MG/2ML IJ SOLN
INTRAMUSCULAR | Status: AC
  Filled 2019-02-02: qty 2

## 2019-02-02 MED ORDER — MIDAZOLAM HCL 5 MG/5ML IJ SOLN
INTRAMUSCULAR | Status: DC | PRN
  Administered 2019-02-02: 2 mg via INTRAVENOUS

## 2019-02-02 MED ORDER — DEXAMETHASONE SODIUM PHOSPHATE 10 MG/ML IJ SOLN
INTRAMUSCULAR | Status: DC | PRN
  Administered 2019-02-02: 6 mg via INTRAVENOUS

## 2019-02-02 MED ORDER — PROPOFOL 10 MG/ML IV BOLUS
INTRAVENOUS | Status: DC | PRN
  Administered 2019-02-02: 200 mg via INTRAVENOUS

## 2019-02-02 MED ORDER — HYDROMORPHONE HCL 1 MG/ML IJ SOLN
0.2500 mg | INTRAMUSCULAR | Status: DC | PRN
  Administered 2019-02-02: 0.5 mg via INTRAVENOUS
  Filled 2019-02-02: qty 0.5

## 2019-02-02 MED ORDER — DIPHENHYDRAMINE HCL 50 MG/ML IJ SOLN
INTRAMUSCULAR | Status: AC
  Filled 2019-02-02: qty 1

## 2019-02-02 MED ORDER — FENTANYL CITRATE (PF) 250 MCG/5ML IJ SOLN
INTRAMUSCULAR | Status: AC
  Filled 2019-02-02: qty 5

## 2019-02-02 MED ORDER — ROCURONIUM BROMIDE 100 MG/10ML IV SOLN
INTRAVENOUS | Status: DC | PRN
  Administered 2019-02-02: 60 mg via INTRAVENOUS

## 2019-02-02 MED ORDER — BUPIVACAINE HCL (PF) 0.5 % IJ SOLN
INTRAMUSCULAR | Status: DC | PRN
  Administered 2019-02-02: 10 mL

## 2019-02-02 MED ORDER — HEMOSTATIC AGENTS (NO CHARGE) OPTIME
TOPICAL | Status: DC | PRN
  Administered 2019-02-02: 1 via TOPICAL

## 2019-02-02 MED ORDER — BUPIVACAINE HCL (PF) 0.5 % IJ SOLN
INTRAMUSCULAR | Status: AC
  Filled 2019-02-02: qty 30

## 2019-02-02 MED ORDER — SODIUM CHLORIDE 0.9 % IV SOLN
2.0000 g | INTRAVENOUS | Status: AC
  Administered 2019-02-02: 2 g via INTRAVENOUS
  Filled 2019-02-02: qty 2

## 2019-02-02 MED ORDER — SODIUM CHLORIDE 0.9 % IR SOLN
Status: DC | PRN
  Administered 2019-02-02: 1000 mL

## 2019-02-02 MED ORDER — OXYCODONE HCL 5 MG PO TABS
5.0000 mg | ORAL_TABLET | ORAL | Status: DC | PRN
  Administered 2019-02-02 (×2): 10 mg via ORAL
  Administered 2019-02-03: 5 mg via ORAL
  Filled 2019-02-02: qty 2
  Filled 2019-02-02: qty 1
  Filled 2019-02-02: qty 2

## 2019-02-02 MED ORDER — MORPHINE SULFATE (PF) 2 MG/ML IV SOLN: 2.0000 mg | INTRAVENOUS | Status: DC | PRN

## 2019-02-02 MED ORDER — DIPHENHYDRAMINE HCL 50 MG/ML IJ SOLN
INTRAMUSCULAR | Status: DC | PRN
  Administered 2019-02-02: 25 mg via INTRAVENOUS

## 2019-02-02 MED ORDER — SUGAMMADEX SODIUM 500 MG/5ML IV SOLN
INTRAVENOUS | Status: AC
  Filled 2019-02-02: qty 5

## 2019-02-02 MED ORDER — LIDOCAINE HCL (CARDIAC) PF 100 MG/5ML IV SOSY
PREFILLED_SYRINGE | INTRAVENOUS | Status: DC | PRN
  Administered 2019-02-02: 60 mg via INTRAVENOUS

## 2019-02-02 MED ORDER — ONDANSETRON HCL 4 MG/2ML IJ SOLN
INTRAMUSCULAR | Status: DC | PRN
  Administered 2019-02-02: 4 mg via INTRAVENOUS

## 2019-02-02 MED ORDER — SUGAMMADEX SODIUM 500 MG/5ML IV SOLN
INTRAVENOUS | Status: DC | PRN
  Administered 2019-02-02: 300 mg via INTRAVENOUS

## 2019-02-02 MED ORDER — PROMETHAZINE HCL 25 MG/ML IJ SOLN: 6.2500 mg | INTRAMUSCULAR | Status: DC | PRN

## 2019-02-02 MED ORDER — FENTANYL CITRATE (PF) 100 MCG/2ML IJ SOLN
INTRAMUSCULAR | Status: DC | PRN
  Administered 2019-02-02: 100 ug via INTRAVENOUS
  Administered 2019-02-02: 50 ug via INTRAVENOUS
  Administered 2019-02-02: 100 ug via INTRAVENOUS

## 2019-02-02 MED ORDER — MIDAZOLAM HCL 2 MG/2ML IJ SOLN
INTRAMUSCULAR | Status: AC
  Filled 2019-02-02: qty 2

## 2019-02-02 SURGICAL SUPPLY — 47 items
APPLIER CLIP ROT 10 11.4 M/L (STAPLE) ×3
BAG RETRIEVAL 10 (BASKET) ×1
BAG RETRIEVAL 10MM (BASKET) ×1
BLADE SURG 15 STRL LF DISP TIS (BLADE) ×1 IMPLANT
BLADE SURG 15 STRL SS (BLADE) ×2
CHLORAPREP W/TINT 26 (MISCELLANEOUS) ×3 IMPLANT
CLIP APPLIE ROT 10 11.4 M/L (STAPLE) ×1 IMPLANT
CLOTH BEACON ORANGE TIMEOUT ST (SAFETY) ×3 IMPLANT
COVER LIGHT HANDLE STERIS (MISCELLANEOUS) ×6 IMPLANT
COVER WAND RF STERILE (DRAPES) ×3 IMPLANT
DECANTER SPIKE VIAL GLASS SM (MISCELLANEOUS) ×3 IMPLANT
DERMABOND ADVANCED (GAUZE/BANDAGES/DRESSINGS) ×2
DERMABOND ADVANCED .7 DNX12 (GAUZE/BANDAGES/DRESSINGS) ×1 IMPLANT
ELECT REM PT RETURN 9FT ADLT (ELECTROSURGICAL) ×3
ELECTRODE REM PT RTRN 9FT ADLT (ELECTROSURGICAL) ×1 IMPLANT
GLOVE BIO SURGEON STRL SZ 6.5 (GLOVE) ×2 IMPLANT
GLOVE BIO SURGEONS STRL SZ 6.5 (GLOVE) ×1
GLOVE BIOGEL M 7.0 STRL (GLOVE) ×6 IMPLANT
GLOVE BIOGEL PI IND STRL 6.5 (GLOVE) ×1 IMPLANT
GLOVE BIOGEL PI IND STRL 7.0 (GLOVE) ×3 IMPLANT
GLOVE BIOGEL PI IND STRL 7.5 (GLOVE) ×1 IMPLANT
GLOVE BIOGEL PI INDICATOR 6.5 (GLOVE) ×2
GLOVE BIOGEL PI INDICATOR 7.0 (GLOVE) ×6
GLOVE BIOGEL PI INDICATOR 7.5 (GLOVE) ×2
GLOVE SURG SS PI 7.5 STRL IVOR (GLOVE) ×3 IMPLANT
GOWN STRL REUS W/TWL LRG LVL3 (GOWN DISPOSABLE) ×9 IMPLANT
HEMOSTAT SNOW SURGICEL 2X4 (HEMOSTASIS) ×3 IMPLANT
INST SET LAPROSCOPIC AP (KITS) ×3 IMPLANT
KIT TURNOVER KIT A (KITS) ×3 IMPLANT
MANIFOLD NEPTUNE II (INSTRUMENTS) ×3 IMPLANT
NEEDLE INSUFFLATION 14GA 120MM (NEEDLE) ×3 IMPLANT
NS IRRIG 1000ML POUR BTL (IV SOLUTION) ×3 IMPLANT
PACK LAP CHOLE LZT030E (CUSTOM PROCEDURE TRAY) ×3 IMPLANT
PAD ARMBOARD 7.5X6 YLW CONV (MISCELLANEOUS) ×3 IMPLANT
SET BASIN LINEN APH (SET/KITS/TRAYS/PACK) ×3 IMPLANT
SET TUBE SMOKE EVAC HIGH FLOW (TUBING) ×3 IMPLANT
SLEEVE ENDOPATH XCEL 5M (ENDOMECHANICALS) ×3 IMPLANT
SUT MNCRL AB 4-0 PS2 18 (SUTURE) ×3 IMPLANT
SUT VICRYL 0 UR6 27IN ABS (SUTURE) ×3 IMPLANT
SYS BAG RETRIEVAL 10MM (BASKET) ×1
SYSTEM BAG RETRIEVAL 10MM (BASKET) ×1 IMPLANT
TROCAR ENDO BLADELESS 11MM (ENDOMECHANICALS) ×3 IMPLANT
TROCAR XCEL NON-BLD 5MMX100MML (ENDOMECHANICALS) ×3 IMPLANT
TROCAR XCEL UNIV SLVE 11M 100M (ENDOMECHANICALS) ×3 IMPLANT
TUBE CONNECTING 12'X1/4 (SUCTIONS) ×1
TUBE CONNECTING 12X1/4 (SUCTIONS) ×2 IMPLANT
WARMER LAPAROSCOPE (MISCELLANEOUS) ×3 IMPLANT

## 2019-02-02 NOTE — Anesthesia Postprocedure Evaluation (Signed)
Anesthesia Post Note  Patient: Gilbert Meyers  Procedure(s) Performed: LAPAROSCOPIC CHOLECYSTECTOMY (N/A )  Patient location during evaluation: PACU Anesthesia Type: General Level of consciousness: awake, oriented, patient cooperative and awake and alert Pain management: pain level controlled Vital Signs Assessment: post-procedure vital signs reviewed and stable Respiratory status: spontaneous breathing, respiratory function stable and nonlabored ventilation Cardiovascular status: stable Postop Assessment: no apparent nausea or vomiting Anesthetic complications: no     Last Vitals:  Vitals:   02/02/19 0506 02/02/19 0918  BP: 115/60 123/76  Pulse: (!) 57 90  Resp: 17 18  Temp: 36.6 C 37 C  SpO2: 98% 98%    Last Pain:  Vitals:   02/02/19 0918  TempSrc: Oral  PainSc: 0-No pain                 Verenise Moulin

## 2019-02-02 NOTE — Transfer of Care (Signed)
Immediate Anesthesia Transfer of Care Note  Patient: Gilbert Meyers  Procedure(s) Performed: LAPAROSCOPIC CHOLECYSTECTOMY (N/A )  Patient Location: PACU  Anesthesia Type:General  Level of Consciousness: awake, alert , oriented and patient cooperative  Airway & Oxygen Therapy: Patient Spontanous Breathing  Post-op Assessment: Report given to RN, Post -op Vital signs reviewed and stable and Patient moving all extremities X 4  Post vital signs: Reviewed and stable  Last Vitals:  Vitals Value Taken Time  BP    Temp    Pulse 87 02/02/19 1137  Resp 14 02/02/19 1137  SpO2 97 % 02/02/19 1137  Vitals shown include unvalidated device data.  Last Pain:  Vitals:   02/02/19 0918  TempSrc: Oral  PainSc: 0-No pain      Patients Stated Pain Goal: 6 (84/13/24 4010)  Complications: No apparent anesthesia complications

## 2019-02-02 NOTE — Anesthesia Procedure Notes (Signed)
Procedure Name: Intubation Date/Time: 02/02/2019 10:44 AM Performed by: Jonna Munro, CRNA Pre-anesthesia Checklist: Patient identified, Emergency Drugs available, Suction available, Patient being monitored and Timeout performed Patient Re-evaluated:Patient Re-evaluated prior to induction Oxygen Delivery Method: Circle system utilized Preoxygenation: Pre-oxygenation with 100% oxygen Induction Type: IV induction Ventilation: Mask ventilation without difficulty Laryngoscope Size: Mac and 3 Grade View: Grade I Tube type: Oral Tube size: 7.5 mm Number of attempts: 1 Airway Equipment and Method: Stylet Placement Confirmation: ETT inserted through vocal cords under direct vision,  positive ETCO2 and breath sounds checked- equal and bilateral Secured at: 24 cm Tube secured with: Tape Dental Injury: Teeth and Oropharynx as per pre-operative assessment

## 2019-02-02 NOTE — Discharge Instructions (Signed)
Discharge Laparoscopic Surgery Instructions:  Common Complaints: Right shoulder pain is common after laparoscopic surgery. This is secondary to the gas used in the surgery being trapped under the diaphragm.  Walk to help your body absorb the gas. This will improve in a few days. Pain at the port sites are common, especially the larger port sites. This will improve with time.  Some nausea is common and poor appetite. The main goal is to stay hydrated the first few days after surgery.   Diet/ Activity: Diet as tolerated. You may not have an appetite, but it is important to stay hydrated. Drink 64 ounces of water a day. Your appetite will return with time.  Shower per your regular routine daily.  Do not take hot showers. Take warm showers that are less than 10 minutes. Rest and listen to your body, but do not remain in bed all day.  Walk everyday for at least 15-20 minutes. Deep cough and move around every 1-2 hours in the first few days after surgery.  Do not lift > 10 lbs, perform excessive bending, pushing, pulling, squatting for 1-2 weeks after surgery.  Do not pick at the dermabond glue on your incision sites.  This glue film will remain in place for 1-2 weeks and will start to peel off.  Do not place lotions or balms on your incision unless instructed to specifically by Dr. Constance Haw.   Medication: Take tylenol and ibuprofen as needed for pain control, alternating every 4-6 hours.  Example:  Tylenol 1000mg  @ 6am, 12noon, 6pm, 4midnight (Do not exceed 4000mg  of tylenol a day). Ibuprofen 800mg  @ 9am, 3pm, 9pm, 3am (Do not exceed 3600mg  of ibuprofen a day).  Take Roxicodone for breakthrough pain every 4 hours.  Take Colace for constipation related to narcotic pain medication. If you do not have a bowel movement in 2 days, take Miralax over the counter.  Drink plenty of water to also prevent constipation.   Contact Information: If you have questions or concerns, please call our office,  [phone], Monday- Thursday 8AM-5PM and Friday 8AM-12Noon.  If it is after hours or on the weekend, please call [facility] Main [facility], [phone], and ask to speak to the surgeon on call for Dr. Constance Haw at [facility].    Laparoscopic Cholecystectomy, Care After This sheet gives you information about how to care for yourself after your procedure. Your health care provider may also give you more specific instructions. If you have problems or questions, contact your health care provider. What can I expect after the procedure? After the procedure, it is common to have:  Pain at your incision sites. You will be given medicines to control this pain.  Mild nausea or vomiting.  Bloating and possible shoulder pain from the air-like gas that was used during the procedure. Follow these instructions at home: Incision care   Follow instructions from your health care provider about how to take care of your incisions. Make sure you: ? Wash your hands with soap and water before you change your bandage (dressing). If soap and water are not available, use hand sanitizer. ? Change your dressing as told by your health care provider. ? Leave stitches (sutures), skin glue, or adhesive strips in place. These skin closures may need to be in place for 2 weeks or longer. If adhesive strip edges start to loosen and curl up, you may trim the loose edges. Do not remove adhesive strips completely unless your health care provider tells you to do that.  Do not take baths, swim, or use a hot tub until your health care provider approves.  °· You may shower. °· Check your incision area every day for signs of infection. Check for: °? More redness, swelling, or pain. °? More fluid or blood. °? Warmth. °? Pus or a bad smell. °Activity °· Do not drive or use heavy machinery while taking prescription pain medicine. °· Do not lift anything that is heavier than 10 lb (4.5 kg) until your health care provider approves. °· Do not play  contact sports until your health care provider approves. °· Do not drive for 24 hours if you were given a medicine to help you relax (sedative). °· Rest as needed. Do not return to work or school until your health care provider approves. °General instructions °· Take over-the-counter and prescription medicines only as told by your health care provider. °· To prevent or treat constipation while you are taking prescription pain medicine, your health care provider may recommend that you: °? Drink enough fluid to keep your urine clear or pale yellow. °? Take over-the-counter or prescription medicines. °? Eat foods that are high in fiber, such as fresh fruits and vegetables, whole grains, and beans. °? Limit foods that are high in fat and processed sugars, such as fried and sweet foods. °Contact a health care provider if: °· You develop a rash. °· You have more redness, swelling, or pain around your incisions. °· You have more fluid or blood coming from your incisions. °· Your incisions feel warm to the touch. °· You have pus or a bad smell coming from your incisions. °· You have a fever. °· One or more of your incisions breaks open. °Get help right away if: °· You have trouble breathing. °· You have chest pain. °· You have increasing pain in your shoulders. °· You faint or feel dizzy when you stand. °· You have severe pain in your abdomen. °· You have nausea or vomiting that lasts for more than one day. °· You have leg pain. °This information is not intended to replace advice given to you by your health care provider. Make sure you discuss any questions you have with your health care provider. °Document Released: 02/19/2005 Document Revised: 02/01/2017 Document Reviewed: 08/08/2015 °Elsevier Patient Education © 2020 [employer] ° °

## 2019-02-02 NOTE — Anesthesia Preprocedure Evaluation (Addendum)
Anesthesia Evaluation  Patient identified by MRN, date of birth, ID band Patient awake    Reviewed: Allergy & Precautions, NPO status , Patient's Chart, lab work & pertinent test results  Airway Mallampati: I  TM Distance: >3 FB Neck ROM: Full    Dental no notable dental hx. (+) Teeth Intact   Pulmonary neg pulmonary ROS, former smoker,    Pulmonary exam normal breath sounds clear to auscultation       Cardiovascular Exercise Tolerance: Good hypertension, Normal cardiovascular examI Rhythm:Regular Rate:Normal  Works as a Clinical research associate at [employer] -reports no limitations  Reports h/o HTN- uncertain which med he was perscribed , but has't taken in months. Normotensive here    Neuro/Psych PSYCHIATRIC DISORDERS Anxiety Bipolar Disorder Schizophrenia negative neurological ROS     GI/Hepatic negative GI ROS, Neg liver ROS,   Endo/Other  negative endocrine ROS  Renal/GU negative Renal ROS  negative genitourinary   Musculoskeletal negative musculoskeletal ROS (+)   Abdominal   Peds negative pediatric ROS (+)  Hematology negative hematology ROS (+)   Anesthesia Other Findings   Reproductive/Obstetrics negative OB ROS                            Anesthesia Physical Anesthesia Plan  ASA: II  Anesthesia Plan: General   Post-op Pain Management:    Induction: Intravenous  PONV Risk Score and Plan: 2 and Ondansetron, Dexamethasone, Treatment may vary due to age or medical condition and Midazolam  Airway Management Planned: Oral ETT  Additional Equipment:   Intra-op Plan:   Post-operative Plan: Extubation in OR  Informed Consent: I have reviewed the patients History and Physical, chart, labs and discussed the procedure including the risks, benefits and alternatives for the proposed anesthesia with the patient or authorized representative who has indicated his/her understanding and acceptance.      Dental advisory given  Plan Discussed with: CRNA  Anesthesia Plan Comments: (Plan Full PPE use Plan GETA D/W PT -WTP with same after Q&A  Pleasant , anxious, probable versed in preop )        Anesthesia Quick Evaluation

## 2019-02-02 NOTE — Op Note (Signed)
Operative Note   Preoperative Diagnosis: Acute Cholecystitis    Postoperative Diagnosis: Same   Procedure(s) Performed: Laparoscopic cholecystectomy   Surgeon: Ria Comment C. Constance Haw, MD   Assistants: Aviva Signs, MD   Anesthesia: General endotracheal   Anesthesiologist: Lenice Llamas, MD    Specimens: Gallbladder    Estimated Blood Loss: Minimal    Blood Replacement: None    Complications: None    Operative Findings: Distended gallbladder    Procedure: The patient was taken to the operating room and placed supine. General endotracheal anesthesia was induced. Intravenous antibiotics were administered per protocol. An orogastric tube positioned to decompress the stomach. The abdomen was prepared and draped in the usual sterile fashion.    A supraumbilical incision was made and a Veress technique was utilized to achieve pneumoperitoneum to 15 mmHg with carbon dioxide. A 11 mm optiview port was placed through the supraumbilical region, and a 10 mm 0-degree operative laparoscope was introduced. The area underlying the trocar and Veress needle were inspected and without evidence of injury.  Remaining trocars were placed under direct vision. Two 5 mm ports were placed in the right abdomen, between the anterior axillary and midclavicular line.  A final 11 mm port was placed through the mid-epigastrium, near the falciform ligament.    The gallbladder fundus was elevated cephalad and the infundibulum was retracted to the patient's right. The gallbladder/cystic duct junction was skeletonized. The cystic artery noted in the triangle of Calot and was also skeletonized.  We then continued liberal medial and lateral dissection until the critical view of safety was achieved.    The cystic duct and cystic artery were doubly clipped and divided. The gallbladder was then dissected from the liver bed with electrocautery. The specimen was placed in an Endopouch and was retrieved through the epigastric  site.   Final inspection revealed acceptable hemostasis. Surgical Emogene Morgan was placed in the gallbladder bed. Trocars were removed and pneumoperitoneum was released. 0 Vicryl fascial sutures were used to umbilical port sites and the epigastric site was over the rib.  Skin incisions were closed with 4-0 Monocryl subcuticular sutures and Dermabond. The patient was awakened from anesthesia and extubated without complication.    Curlene Labrum, MD [facility] [address] [place], [place] [address] [phone] (office)

## 2019-02-02 NOTE — Progress Notes (Signed)
[facility]  Called mother, Butch Penny [phone], notified surgery completed.   If the patient wants to go later today, he can go. RN to notify me.  Curlene Labrum, MD [facility] [address] [place], [place] [address] [phone]/ [phone] (office)

## 2019-02-02 NOTE — Consult Note (Signed)
[facility] Consult  Reason for Consult: Acute cholecystitis  Referring Physician:  Pauline Aus, PA (ED)   Chief Complaint    Abdominal Pain      HPI: Gilbert Meyers is a 33 y.o. male with findings of acute cholecystitis on [place]. He came to the ED 2 days ago with upper abdominal pain in the RUQ and nausea/vomiting. He had a CT scan that demonstrated gallstones but no obvious cholecystitis. He had a slightly elevated T bili.  He came back to the ED to get an [place] and this demonstrated some thickening of the wall and edema concerning for early cholecystitis. He reports having some RUQ pain in the past with greasy foods, and this attack has not improved. He was still having nausea yesterday when he came back to the ED.  He says that he is still having RUQ pain and tenderness.   He was brought into the hospital and given antibiotics yesterday.   Past Medical History:  Diagnosis Date  . Anxiety   . Bipolar 1 disorder (HCC)   . Schizophrenia (HCC)     History reviewed. No pertinent surgical history.  Family History  Problem Relation Age of Onset  . Prostate cancer Paternal Grandfather   . Colon cancer Neg Hx     Social History   Tobacco Use  . Smoking status: Former Smoker    Packs/day: 0.10    Types: E-cigarettes  . Smokeless tobacco: Never Used  Substance Use Topics  . Alcohol use: No    Comment: former  . Drug use: Yes    Types: Marijuana    Medications:  I have reviewed the patient's current medications. Prior to Admission:  Medications Prior to Admission  Medication Sig Dispense Refill Last Dose  . benztropine (COGENTIN) 0.5 MG tablet Take 1 tablet (0.5 mg total) by mouth 2 (two) times daily. For prevention of drug induced tremors 60 tablet 0 01/29/2019  . hydrOXYzine (ATARAX/VISTARIL) 50 MG tablet Take 50 mg by mouth 3 (three) times daily as needed for anxiety.   01/29/2019 at Unknown time  . risperiDONE (RISPERDAL) 0.5 MG tablet Take 1 tablet (0.5  mg) by mouth in the morning & 2 tablets (1 mg) at bedtime: For mood control 90 tablet 0 01/29/2019  . traZODone (DESYREL) 100 MG tablet Take 1 tablet (100 mg total) by mouth at bedtime as needed for sleep. 30 tablet 0 01/31/2019  . hydrOXYzine (ATARAX/VISTARIL) 25 MG tablet Take 1 tablet (25 mg total) by mouth every 6 (six) hours as needed for anxiety. 60 tablet 0  at Unknown time   Scheduled: . [MAR Hold] benztropine  0.5 mg Oral BID  . influenza vac split quadrivalent PF  0.5 mL Intramuscular Tomorrow-1000  . [MAR Hold] risperiDONE  0.5 mg Oral Daily  . [MAR Hold] risperiDONE  1 mg Oral QHS  . [MAR Hold] sodium chloride flush  3 mL Intravenous Q12H   Continuous: . [MAR Hold] sodium chloride 250 mL (02/01/19 1630)  . [MAR Hold] cefTRIAXone (ROCEPHIN)  IV     PRN:[MAR Hold] sodium chloride, [MAR Hold] acetaminophen **OR** [MAR Hold] acetaminophen, [MAR Hold] fentaNYL (SUBLIMAZE) injection, [MAR Hold] HYDROcodone-acetaminophen, [MAR Hold] hydrOXYzine, [MAR Hold] ondansetron **OR** [MAR Hold] ondansetron (ZOFRAN) IV, [MAR Hold] sodium chloride flush, [MAR Hold] traZODone  Allergies  Allergen Reactions  . Cymbalta [Duloxetine Hcl]     "makes him feel awful"     ROS:  A comprehensive review of systems was negative except for: Respiratory: positive for cough Gastrointestinal:  positive for abdominal pain, nausea and vomiting  Blood pressure 115/60, pulse (!) 57, temperature 97.8 F (36.6 C), resp. rate 17, height  (1.778 m), weight 94.2 kg, SpO2 98 %. Physical Exam Vitals signs reviewed.  Constitutional:      Appearance: He is well-developed.  HENT:     Head: Normocephalic and atraumatic.  Eyes:     Extraocular Movements: Extraocular movements intact.     Pupils: Pupils are equal, round, and reactive to light.  Cardiovascular:     Rate and Rhythm: Normal rate and regular rhythm.  Pulmonary:     Effort: Pulmonary effort is normal.     Comments: Dry cough Abdominal:      General: There is no distension.     Tenderness: There is abdominal tenderness in the right upper quadrant. There is no guarding or rebound.     Hernia: No hernia is present.  Skin:    General: Skin is warm and dry.  Neurological:     General: No focal deficit present.     Mental Status: He is alert and oriented to person, place, and time.  Psychiatric:        Mood and Affect: Mood normal.        Behavior: Behavior normal.     Results: Results for orders placed or performed during the hospital encounter of 02/01/19 (from the past 48 hour(s))  SARS CORONAVIRUS 2 (TAT 6-24 HRS) Nasopharyngeal Nasopharyngeal Swab     Status: None   Collection Time: 02/01/19 12:14 PM   Specimen: Nasopharyngeal Swab  Result Value Ref Range   SARS Coronavirus 2 NEGATIVE NEGATIVE    Comment: (NOTE) SARS-CoV-2 target nucleic acids are NOT DETECTED. The SARS-CoV-2 RNA is generally detectable in upper and lower respiratory specimens during the acute phase of infection. Negative results do not preclude SARS-CoV-2 infection, do not rule out co-infections with other pathogens, and should not be used as the sole basis for treatment or other patient management decisions. Negative results must be combined with clinical observations, patient history, and epidemiological information. The expected result is Negative. Fact Sheet for Patients: HairSlick.no Fact Sheet for Healthcare Providers: quierodirigir.com This test is not yet approved or cleared by the [place] FDA and  has been authorized for detection and/or diagnosis of SARS-CoV-2 by FDA under an Emergency Use Authorization (EUA). This EUA will remain  in effect (meaning this test can be used) for the duration of the COVID-19 declaration under Section 56 4(b)(1) of the Act, 21 U.S.C. section 360bbb-3(b)(1), unless the authorization is terminated or revoked sooner. Performed at [facility], [address], [place], [place] [address]   Comprehensive metabolic panel     Status: Abnormal   Collection Time: 02/01/19 12:15 PM  Result Value Ref Range   Sodium 137 135 - 145 mmol/L   Potassium 4.0 3.5 - 5.1 mmol/L   Chloride 103 98 - 111 mmol/L   CO2 25 22 - 32 mmol/L   Glucose, Bld 95 70 - 99 mg/dL   BUN 8 6 - 20 mg/dL   Creatinine, Ser 6.04 0.61 - 1.24 mg/dL   Calcium 8.7 (L) 8.9 - 10.3 mg/dL   Total Protein 7.0 6.5 - 8.1 g/dL   Albumin 4.3 3.5 - 5.0 g/dL   AST 19 15 - 41 U/L   ALT 20 0 - 44 U/L   Alkaline Phosphatase 69 38 - 126 U/L   Total Bilirubin 1.9 (H) 0.3 - 1.2 mg/dL   GFR calc  non Af Amer >60 >60 mL/min   GFR calc Af Amer >60 >60 mL/min   Anion gap 9 5 - 15    Comment: Performed at [facility], [address], [place], [place] [address]  CBC with Differential     Status: None   Collection Time: 02/01/19 12:15 PM  Result Value Ref Range   WBC 6.8 4.0 - 10.5 K/uL   RBC 4.74 4.22 - 5.81 MIL/uL   Hemoglobin 14.0 13.0 - 17.0 g/dL   HCT 32.3 55.7 - 32.2 %   MCV 89.0 80.0 - 100.0 fL   MCH 29.5 26.0 - 34.0 pg   MCHC 33.2 30.0 - 36.0 g/dL   RDW 02.5 42.7 - 06.2 %   Platelets 275 150 - 400 K/uL   nRBC 0.0 0.0 - 0.2 %   Neutrophils Relative % 62 %   Neutro Abs 4.2 1.7 - 7.7 K/uL   Lymphocytes Relative 28 %   Lymphs Abs 1.9 0.7 - 4.0 K/uL   Monocytes Relative 7 %   Monocytes Absolute 0.5 0.1 - 1.0 K/uL   Eosinophils Relative 2 %   Eosinophils Absolute 0.2 0.0 - 0.5 K/uL   Basophils Relative 1 %   Basophils Absolute 0.1 0.0 - 0.1 K/uL   Immature Granulocytes 0 %   Abs Immature Granulocytes 0.01 0.00 - 0.07 K/uL    Comment: Performed at [facility], [address] [place], [place] [address]  HIV Antibody (routine testing w rflx)     Status: None   Collection Time: 02/01/19 12:15 PM  Result Value Ref Range   HIV Screen 4th Generation wRfx NON REACTIVE NON REACTIVE    Comment: Performed at [facility], [address], [place], [place] [address]   Surgical pcr screen     Status: Abnormal   Collection Time: 02/01/19  4:42 PM   Specimen: Nasal Mucosa; Nasal Swab  Result Value Ref Range   MRSA, PCR NEGATIVE NEGATIVE   Staphylococcus aureus POSITIVE (A) NEGATIVE    Comment: (NOTE) The Xpert SA Assay (FDA approved for NASAL specimens in patients 56 years of age and older), is one component of a comprehensive surveillance program. It is not intended to diagnose infection nor to guide or monitor treatment. Performed at [facility], [address], [place], [place] [address]   Magnesium     Status: None   Collection Time: 02/02/19  5:34 AM  Result Value Ref Range   Magnesium 2.4 1.7 - 2.4 mg/dL    Comment: Performed at [facility], [address], [place], [place] [address]  CBC WITH DIFFERENTIAL     Status: None   Collection Time: 02/02/19  5:34 AM  Result Value Ref Range   WBC 9.1 4.0 - 10.5 K/uL   RBC 4.81 4.22 - 5.81 MIL/uL   Hemoglobin 14.2 13.0 - 17.0 g/dL   HCT 26.9 48.5 - 46.2 %   MCV 88.6 80.0 - 100.0 fL   MCH 29.5 26.0 - 34.0 pg   MCHC 33.3 30.0 - 36.0 g/dL   RDW 70.3 50.0 - 93.8 %   Platelets 284 150 - 400 K/uL   nRBC 0.0 0.0 - 0.2 %   Neutrophils Relative % 53 %   Neutro Abs 4.9 1.7 - 7.7 K/uL   Lymphocytes Relative 37 %   Lymphs Abs 3.3 0.7 - 4.0 K/uL   Monocytes Relative 6 %   Monocytes Absolute 0.6 0.1 - 1.0 K/uL   Eosinophils Relative 3 %  Eosinophils Absolute 0.3 0.0 - 0.5 K/uL   Basophils Relative 1 %   Basophils Absolute 0.1 0.0 - 0.1 K/uL   Immature Granulocytes 0 %   Abs Immature Granulocytes 0.01 0.00 - 0.07 K/uL    Comment: Performed at [facility], [address] [place], [place] [address]  Comprehensive metabolic panel     Status: Abnormal   Collection Time: 02/02/19  5:34 AM  Result Value Ref Range   Sodium 139 135 - 145 mmol/L   Potassium 3.8 3.5 - 5.1 mmol/L   Chloride 104 98 - 111 mmol/L   CO2 24 22 - 32 mmol/L   Glucose, Bld 90 70 - 99 mg/dL   BUN 8 6 - 20 mg/dL   Creatinine, Ser  0.95 0.61 - 1.24 mg/dL   Calcium 8.8 (L) 8.9 - 10.3 mg/dL   Total Protein 6.9 6.5 - 8.1 g/dL   Albumin 4.2 3.5 - 5.0 g/dL   AST 17 15 - 41 U/L   ALT 19 0 - 44 U/L   Alkaline Phosphatase 64 38 - 126 U/L   Total Bilirubin 1.6 (H) 0.3 - 1.2 mg/dL   GFR calc non Af Amer >60 >60 mL/min   GFR calc Af Amer >60 >60 mL/min   Anion gap 11 5 - 15    Comment: Performed at [facility], [address] [place], [place] [address]   Personally reviewed imaging- gallstones on CT, some thickening on [place] Ct Abdomen Pelvis W Contrast  Result Date: 01/31/2019 CLINICAL DATA:  33 year old male with intermittent upper abdominal pain and vomiting. EXAM: CT ABDOMEN AND PELVIS WITH CONTRAST TECHNIQUE: Multidetector CT imaging of the abdomen and pelvis was performed using the standard protocol following bolus administration of intravenous contrast. CONTRAST:  157mL OMNIPAQUE IOHEXOL 300 MG/ML  SOLN COMPARISON:  CT of the abdomen pelvis dated 10/06/2010. FINDINGS: Lower chest: The visualized lung bases are clear. No intra-abdominal free air or free fluid. Hepatobiliary: Apparent fatty infiltration of the liver. No intrahepatic biliary ductal dilatation. There are multiple stones within the gallbladder including stone within the gallbladder neck. There is no pericholecystic fluid or evidence of acute cholecystitis by CT. Ultrasound may provide better evaluation if there is high clinical concern for acute cholecystitis. Pancreas: Unremarkable. No pancreatic ductal dilatation or surrounding inflammatory changes. Spleen: Normal in size without focal abnormality. Adrenals/Urinary Tract: The adrenal glands are unremarkable. The kidneys, visualized ureters, and urinary bladder appear unremarkable as well. Stomach/Bowel: There is moderate stool throughout the colon. There is no bowel obstruction or active inflammation. The appendix is normal. Vascular/Lymphatic: The abdominal aorta and IVC are unremarkable. No portal venous gas. There  is no adenopathy. Reproductive: The prostate and seminal vesicles are grossly unremarkable. No pelvic mass. Other: Small fat containing umbilical hernia. No fluid collection. Musculoskeletal: Stable sclerotic lesion of the left femoral neck similar to the study of 2012. No acute osseous pathology. IMPRESSION: 1. Cholelithiasis without evidence of acute cholecystitis by CT. Ultrasound may provide better evaluation if there is high clinical concern for acute cholecystitis. 2. Moderate colonic stool burden. No bowel obstruction or active inflammation. Normal appendix. 3. Fatty liver. Electronically Signed   By: Anner Crete M.D.   On: 01/31/2019 15:52   US Abdomen Limited Ruq/gall Gladder  Result Date: 02/01/2019 CLINICAL DATA:  Upper abdominal pain EXAM: ULTRASOUND ABDOMEN LIMITED RIGHT UPPER QUADRANT COMPARISON:  CT abdomen and pelvis January 31, 2019 FINDINGS: Gallbladder: Within the gallbladder, there are echogenic foci which move and shadow consistent with cholelithiasis. Largest  individual gallstone measures 7 mm. Two gallbladder wall appears thickened and somewhat edematous. There is no appreciable pericholecystic fluid. No sonographic Murphy sign noted by sonographer. Common bile duct: Diameter: 5 mm. No intrahepatic or extrahepatic biliary duct dilatation. Liver: No focal lesion identified. Liver echogenicity overall mildly increased. Portal vein is patent on color Doppler imaging with normal direction of blood flow towards the liver. Other: None. IMPRESSION: 1. Cholelithiasis with gallbladder wall thickening. Concern for a degree of acute cholecystitis. This finding may warrant correlation with nuclear medicine hepatobiliary imaging study to assess for cystic duct patency. 2. Suspect a degree of hepatic steatosis. No focal liver lesions evident. Electronically Signed   By: Bretta BangWilliam  Woodruff III M.D.   On: 02/01/2019 10:53     Assessment & Plan:  Liam GrahamJeremy Hernon is a 33 y.o. male with what appears  to be early acute cholecystitis versus some degree of chronic cholecystitis. He has been having pain and nausea/ vomiting and this is not improving.  Given this he was brought in for cholecystectomy.   -Lap cholecystectomy today -Has been on antibiotics PLAN: I counseled the patient about the indication, risks and benefits of laparoscopic cholecystectomy.  He understands there is a very small chance for bleeding, infection, injury to normal structures (including common bile duct), conversion to open surgery, persistent symptoms, evolution of postcholecystectomy diarrhea, need for secondary interventions, anesthesia reaction, cardiopulmonary issues and other risks not specifically detailed here. I described the expected recovery, the plan for follow-up and the restrictions during the recovery phase.  All questions were answered.   Lucretia RoersLindsay C Hy Swiatek 02/02/2019, 8:50 AM

## 2019-02-03 ENCOUNTER — Encounter (HOSPITAL_COMMUNITY): Payer: Self-pay | Admitting: General Surgery

## 2019-02-03 LAB — SURGICAL PATHOLOGY

## 2019-02-03 MED ORDER — OXYCODONE HCL 5 MG PO TABS: 5.0000 mg | ORAL_TABLET | ORAL | 0 refills | Status: DC | PRN

## 2019-02-03 MED ORDER — ONDANSETRON HCL 4 MG PO TABS: 4.0000 mg | ORAL_TABLET | Freq: Four times a day (QID) | ORAL | 0 refills | Status: DC | PRN

## 2019-02-03 MED ORDER — DOCUSATE SODIUM 100 MG PO CAPS: 100.0000 mg | ORAL_CAPSULE | Freq: Two times a day (BID) | ORAL | 2 refills | Status: AC

## 2019-02-03 NOTE — Progress Notes (Signed)
Nsg Discharge Note  Admit Date:  02/01/2019 Discharge date: 02/03/2019   Leia Alf to be D/C'd home per MD order.  AVS completed.  Copy for chart, and copy for patient signed, and dated. Patient/caregiver able to verbalize understanding.  Discharge Medication: Allergies as of 02/03/2019      Reactions   Cymbalta [duloxetine Hcl]    "makes him feel awful"      Medication List    TAKE these medications   benztropine 0.5 MG tablet Commonly known as: COGENTIN Take 1 tablet (0.5 mg total) by mouth 2 (two) times daily. For prevention of drug induced tremors   docusate sodium 100 MG capsule Commonly known as: Colace Take 1 capsule (100 mg total) by mouth 2 (two) times daily.   hydrOXYzine 50 MG tablet Commonly known as: ATARAX/VISTARIL Take 50 mg by mouth 3 (three) times daily as needed for anxiety.   hydrOXYzine 25 MG tablet Commonly known as: ATARAX/VISTARIL Take 1 tablet (25 mg total) by mouth every 6 (six) hours as needed for anxiety.   ondansetron 4 MG tablet Commonly known as: ZOFRAN Take 1 tablet (4 mg total) by mouth every 6 (six) hours as needed for nausea.   oxyCODONE 5 MG immediate release tablet Commonly known as: Oxy IR/ROXICODONE Take 1 tablet (5 mg total) by mouth every 4 (four) hours as needed for severe pain or breakthrough pain.   risperiDONE 0.5 MG tablet Commonly known as: RISPERDAL Take 1 tablet (0.5 mg) by mouth in the morning & 2 tablets (1 mg) at bedtime: For mood control   traZODone 100 MG tablet Commonly known as: DESYREL Take 1 tablet (100 mg total) by mouth at bedtime as needed for sleep.       Discharge Assessment: Vitals:   02/03/19 0409 02/03/19 0800  BP: 107/73 105/69  Pulse: 71 73  Resp: 17 18  Temp: 97.8 F (36.6 C) 98 F (36.7 C)  SpO2: 97% 96%   Skin clean, dry and intact without evidence of skin break down, no evidence of skin tears noted. IV catheter discontinued intact. Site without signs and symptoms of complications -  no redness or edema noted at insertion site, patient denies c/o pain - only slight tenderness at site.  Dressing with slight pressure applied.  D/c Instructions-Education: Discharge instructions given to patient/family with verbalized understanding. D/c education completed with patient/family including follow up instructions, medication list, d/c activities limitations if indicated, with other d/c instructions as indicated by MD - patient able to verbalize understanding, all questions fully answered. Patient instructed to return to ED, call 911, or call MD for any changes in condition.  Patient escorted via [place], and D/C home via private auto.  Venita Sheffield, RN 02/03/2019 12:45 PM

## 2019-02-03 NOTE — Plan of Care (Signed)
  Problem: Education: Goal: Knowledge of General Education information will improve Description: Including pain rating scale, medication(s)/side effects and non-pharmacologic comfort measures Outcome: Adequate for Discharge   Problem: Health Behavior/Discharge Planning: Goal: Ability to manage health-related needs will improve Outcome: Adequate for Discharge   Problem: Clinical Measurements: Goal: Ability to maintain clinical measurements within normal limits will improve Outcome: Adequate for Discharge Goal: Will remain free from infection Outcome: Adequate for Discharge Goal: Diagnostic test results will improve Outcome: Adequate for Discharge Goal: Respiratory complications will improve Outcome: Adequate for Discharge Goal: Cardiovascular complication will be avoided Outcome: Adequate for Discharge   Problem: Activity: Goal: Risk for activity intolerance will decrease Outcome: Adequate for Discharge   Problem: Nutrition: Goal: Adequate nutrition will be maintained Outcome: Adequate for Discharge   Problem: Coping: Goal: Level of anxiety will decrease Outcome: Adequate for Discharge   Problem: Elimination: Goal: Will not experience complications related to bowel motility Outcome: Adequate for Discharge Goal: Will not experience complications related to urinary retention Outcome: Adequate for Discharge   Problem: Pain Managment: Goal: General experience of comfort will improve Outcome: Adequate for Discharge   Problem: Safety: Goal: Ability to remain free from injury will improve Outcome: Adequate for Discharge   Problem: Education: Goal: Knowledge of General Education information will improve Description: Including pain rating scale, medication(s)/side effects and non-pharmacologic comfort measures Outcome: Adequate for Discharge   Problem: Health Behavior/Discharge Planning: Goal: Ability to manage health-related needs will improve Outcome: Adequate for  Discharge   Problem: Clinical Measurements: Goal: Ability to maintain clinical measurements within normal limits will improve Outcome: Adequate for Discharge Goal: Will remain free from infection Outcome: Adequate for Discharge Goal: Diagnostic test results will improve Outcome: Adequate for Discharge Goal: Respiratory complications will improve Outcome: Adequate for Discharge Goal: Cardiovascular complication will be avoided Outcome: Adequate for Discharge   Problem: Activity: Goal: Risk for activity intolerance will decrease Outcome: Adequate for Discharge   Problem: Nutrition: Goal: Adequate nutrition will be maintained Outcome: Adequate for Discharge   Problem: Coping: Goal: Level of anxiety will decrease Outcome: Adequate for Discharge   Problem: Elimination: Goal: Will not experience complications related to bowel motility Outcome: Adequate for Discharge Goal: Will not experience complications related to urinary retention Outcome: Adequate for Discharge   Problem: Pain Managment: Goal: General experience of comfort will improve Outcome: Adequate for Discharge   Problem: Safety: Goal: Ability to remain free from injury will improve Outcome: Adequate for Discharge   Problem: Skin Integrity: Goal: Risk for impaired skin integrity will decrease Outcome: Adequate for Discharge

## 2019-02-03 NOTE — Discharge Summary (Signed)
Physician Discharge Summary  Patient ID: Gilbert Meyers MRN: [id number] DOB/AGE: Nov 14, 1985 33 y.o.  Admit date: 02/01/2019 Discharge date: 02/04/2019  Admission Diagnoses: Acute cholecystitis   Discharge Diagnoses:  Principal Problem:   Acute cholecystitis Active Problems:   Undifferentiated schizophrenia ([place])   Hypertension   Discharged Condition: good  Hospital Course: Mr. Macke is a 33 yo with RUQ pain and US findings worrisome for cholecystitis. He reported having some gallbladder attacks prior and continued to have pain after his first visit to the ED. He was brought in for antibiotics and cholecystectomy. Post operatively, he was having some pain and remained overnight for pain control. He did well the next morning and was eating a diet and ready to go home.   Consults: Hospitalist admission- Surgery service took over   Significant Diagnostic Studies: [place]- wall thickening concern for cholecystitis   Treatments: IV hydration, Antibiotics: Ceftriaxone, Surgery: laparoscopic cholecystectomy 02/02/19   Discharge Exam: Blood pressure 111/77, pulse 79, temperature 98 F (36.7 C), temperature source Oral, resp. rate 18, height 5\' 10"  (1.778 m), weight 94.2 kg, SpO2 97 %. General appearance: alert, cooperative and no distress Resp: normal work of breathing GI: soft, mildly distended, appropriately tender, dermabond on port sites  Disposition:    Discharge Instructions    Call MD for:  difficulty breathing, headache or visual disturbances   Complete by: As directed    Call MD for:  extreme fatigue   Complete by: As directed    Call MD for:  persistant dizziness or light-headedness   Complete by: As directed    Call MD for:  persistant nausea and vomiting   Complete by: As directed    Call MD for:  redness, tenderness, or signs of infection (pain, swelling, redness, odor or green/yellow discharge around incision site)   Complete by: As directed    Call MD for:  severe  uncontrolled pain   Complete by: As directed    Call MD for:  temperature >100.4   Complete by: As directed    Increase activity slowly   Complete by: As directed      Allergies as of 02/03/2019      Reactions   Cymbalta [duloxetine Hcl]    "makes him feel awful"      Medication List    TAKE these medications   benztropine 0.5 MG tablet Commonly known as: COGENTIN Take 1 tablet (0.5 mg total) by mouth 2 (two) times daily. For prevention of drug induced tremors   docusate sodium 100 MG capsule Commonly known as: Colace Take 1 capsule (100 mg total) by mouth 2 (two) times daily.   hydrOXYzine 50 MG tablet Commonly known as: ATARAX/VISTARIL Take 50 mg by mouth 3 (three) times daily as needed for anxiety.   hydrOXYzine 25 MG tablet Commonly known as: ATARAX/VISTARIL Take 1 tablet (25 mg total) by mouth every 6 (six) hours as needed for anxiety.   ondansetron 4 MG tablet Commonly known as: ZOFRAN Take 1 tablet (4 mg total) by mouth every 6 (six) hours as needed for nausea.   oxyCODONE 5 MG immediate release tablet Commonly known as: Oxy IR/ROXICODONE Take 1 tablet (5 mg total) by mouth every 4 (four) hours as needed for severe pain or breakthrough pain.   risperiDONE 0.5 MG tablet Commonly known as: RISPERDAL Take 1 tablet (0.5 mg) by mouth in the morning & 2 tablets (1 mg) at bedtime: For mood control   traZODone 100 MG tablet Commonly known as: DESYREL Take 1 tablet (  100 mg total) by mouth at bedtime as needed for sleep.      Follow-up Information    Lucretia Roers, MD On 02/17/2019.   Specialty: General Surgery Why: Post operative phone call 12/15; if you need to be seen in person let the office know; take any work paperwork to the office  Contact information: [address] [place]  [address] [phone]           Signed: Lucretia Roers 02/04/2019, 11:08 AM

## 2019-02-17 ENCOUNTER — Telehealth (INDEPENDENT_AMBULATORY_CARE_PROVIDER_SITE_OTHER): Payer: Medicaid Other | Admitting: General Surgery

## 2019-02-17 DIAGNOSIS — K8020 Calculus of gallbladder without cholecystitis without obstruction: Secondary | ICD-10-CM

## 2019-02-17 NOTE — Progress Notes (Signed)
[facility]  I am calling the patient for post operative evaluation due to the current COVID 19 pandemic.  The patient had a laparoscopic cholecystectomy on 11/30. Tried patient on 12/15 and left a message to call with questions or concerns and notified that the pathology was good.   Called again on 12/16 but no answer.   Will see the patient PRN.   Pathology: FINAL MICROSCOPIC DIAGNOSIS:   A. GALLBLADDER, CHOLECYSTECTOMY:  - Chronic cholecystitis with cholelithiasis.   Curlene Labrum, MD [facility] [address] [place], [place] [address] [phone] (office)

## 2019-05-07 IMAGING — DX DG NECK SOFT TISSUE
2 series · 2 of 2 positions shown · non-contrast
Comparison: None.

CLINICAL DATA: 31 y/o M; unable to swallow fluid for 5 weeks.
Initial encounter.

EXAM:
NECK SOFT TISSUES - 1+ VIEW

[neck lat]
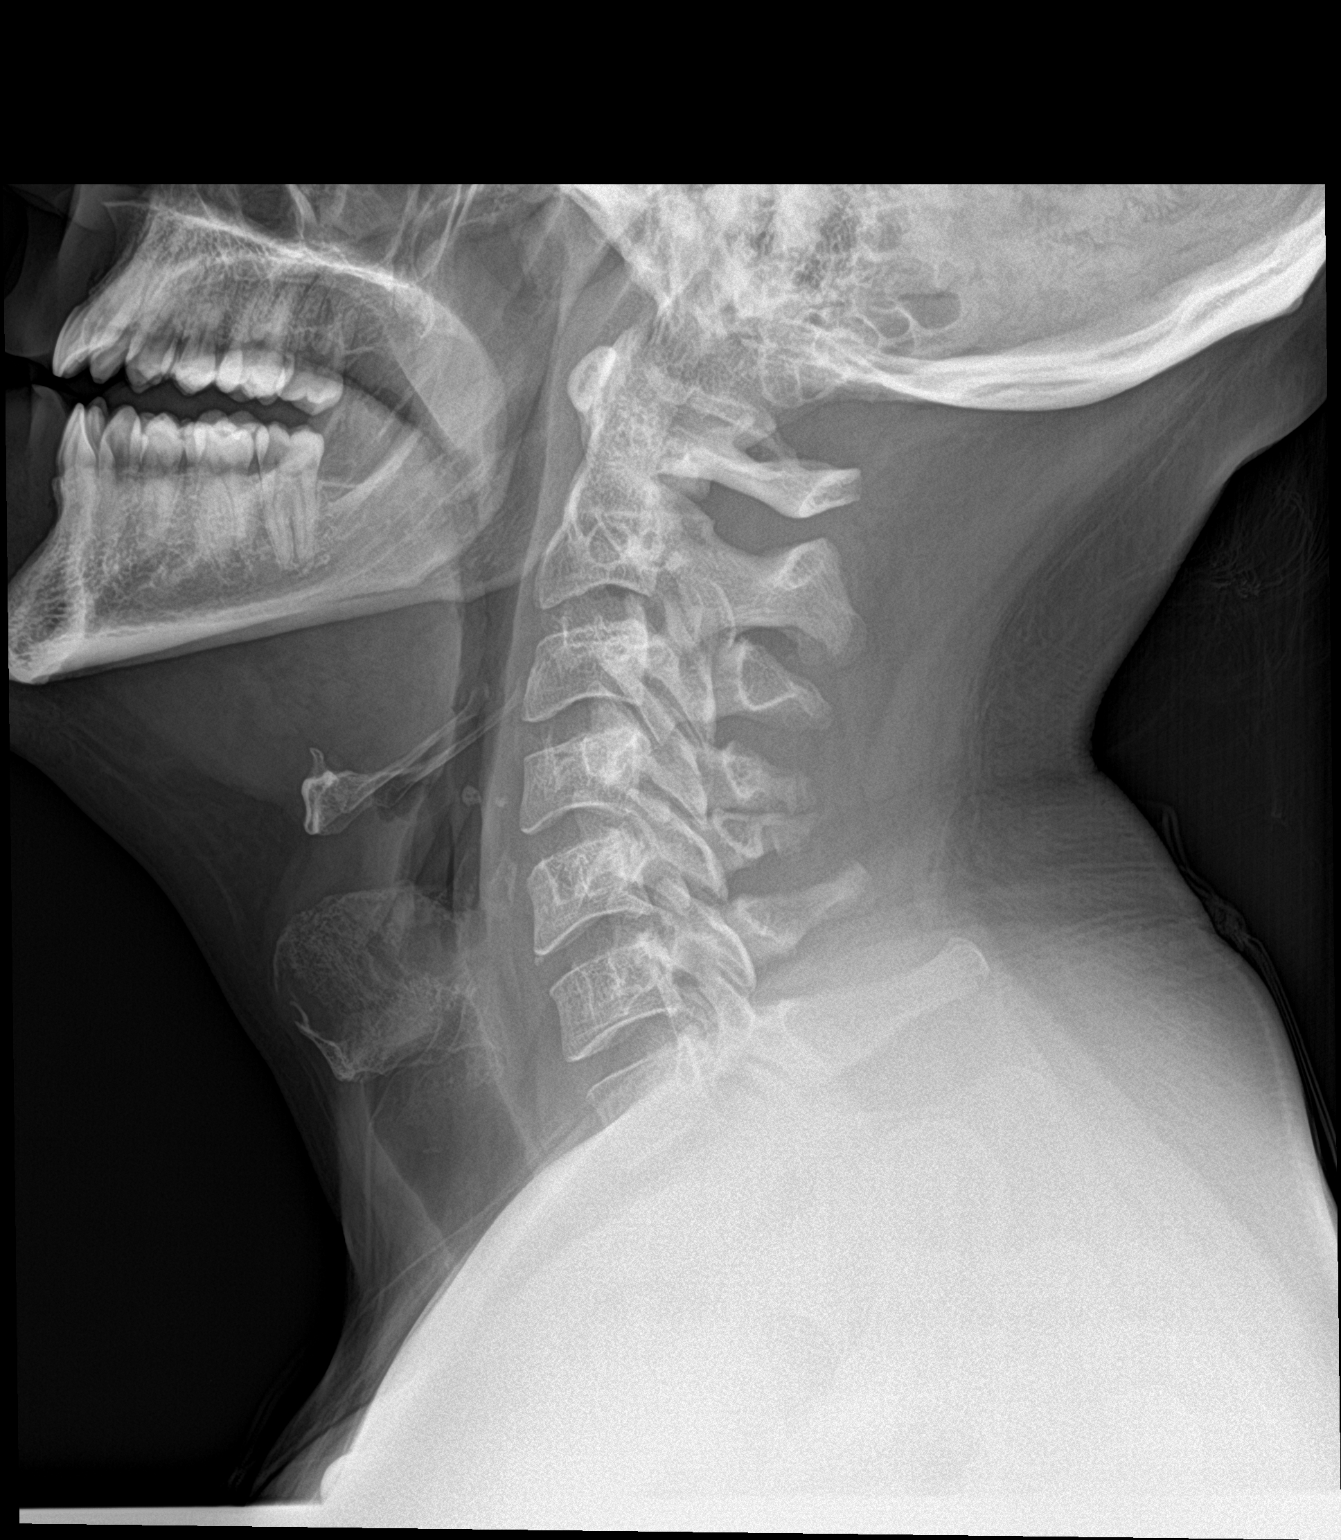

[neck ap]
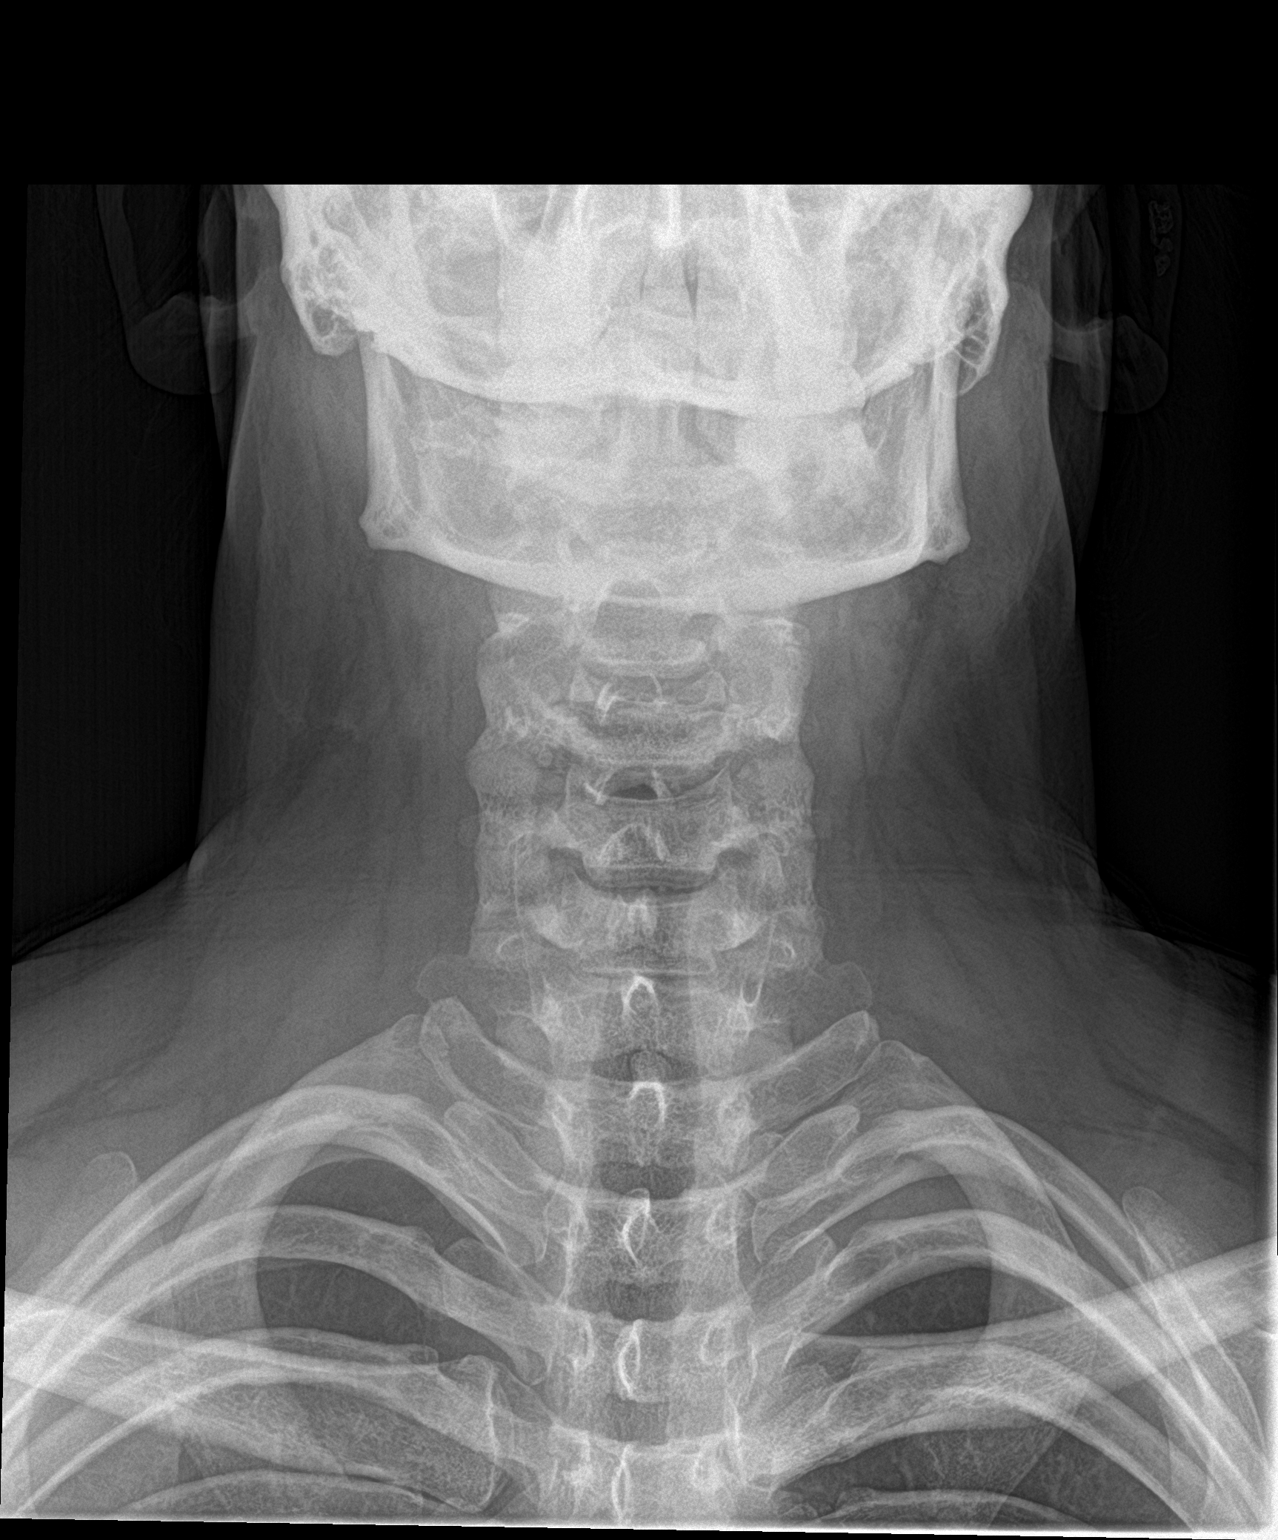

[2 of 2 positions shown; findings below may reference images not displayed]

FINDINGS: There is no evidence of retropharyngeal soft tissue swelling or
epiglottic enlargement. Abnormal appearance of thyroid cartilage
calcification. The cervical airway is unremarkable and no
radio-opaque foreign body identified.
IMPRESSION: Abnormal appearance of thyroid cartilage ossification, possibly
projectional. Consider CT of the neck with contrast for further
evaluation.

By: Zeinab Tiger M.D.

## 2021-01-07 IMAGING — US US ABDOMEN LIMITED
1 series · 14 of 25 positions shown · non-contrast
Comparison: CT abdomen and pelvis January 31, 2019

CLINICAL DATA: Upper abdominal pain

EXAM:
ULTRASOUND ABDOMEN LIMITED RIGHT UPPER QUADRANT

[Series 1: us abdomen limited · 0.18mm/px · 14 of 109 slices shown]
[im 1/109]
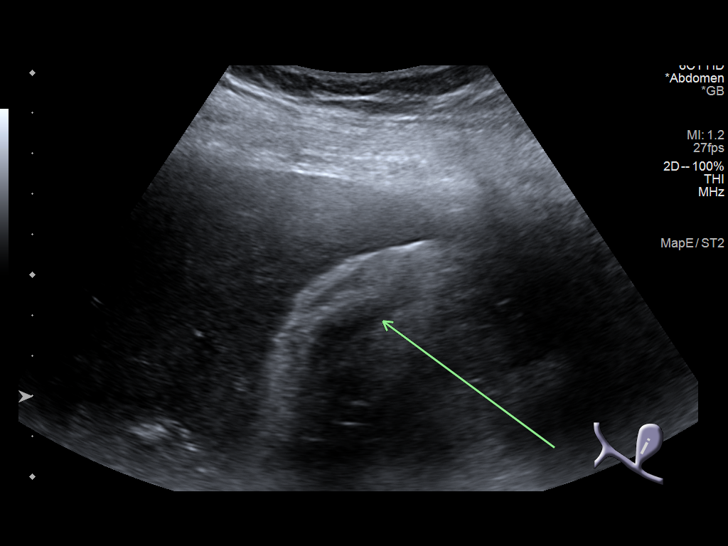
[im 10/109]
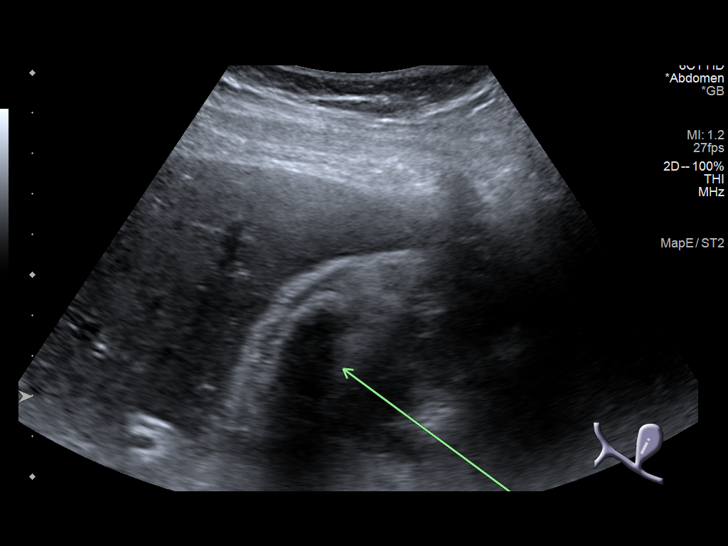
[im 19/109]
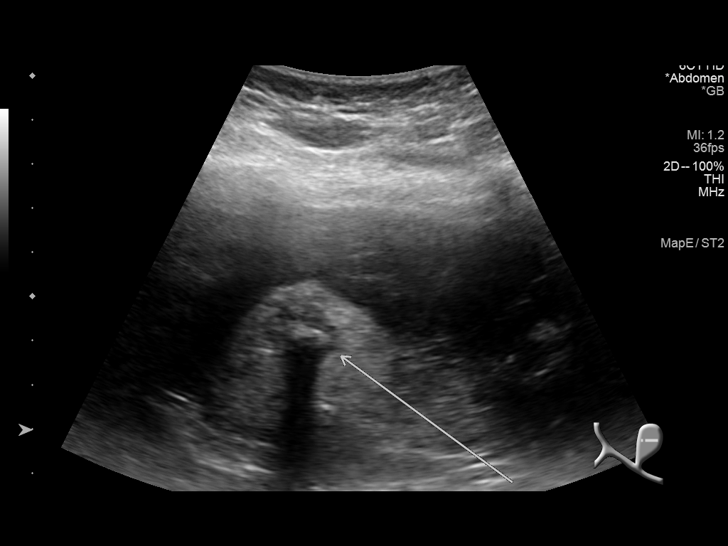
[im 28/109]
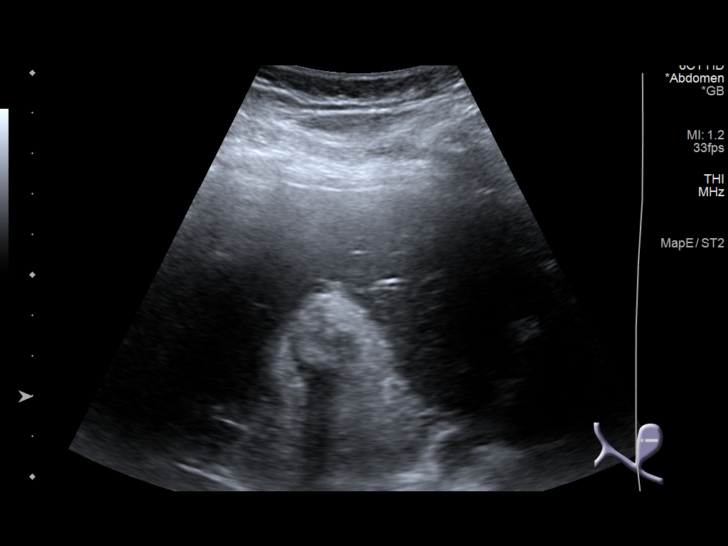
[im 37/109]
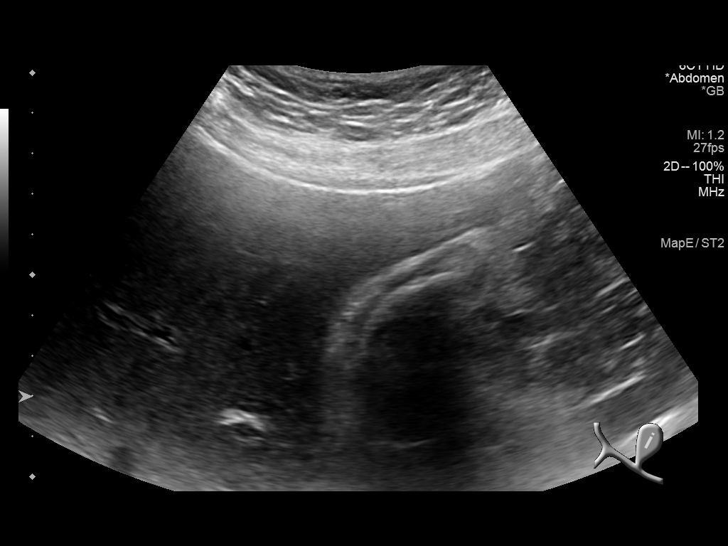
[im 41/109]
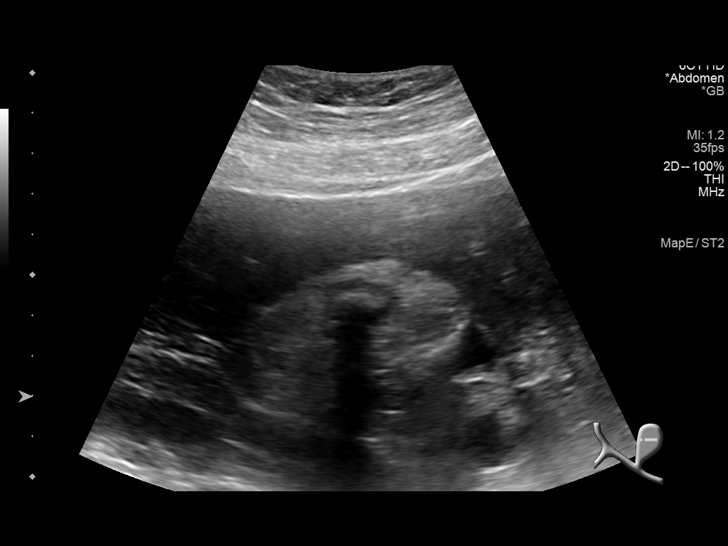
[im 50/109]
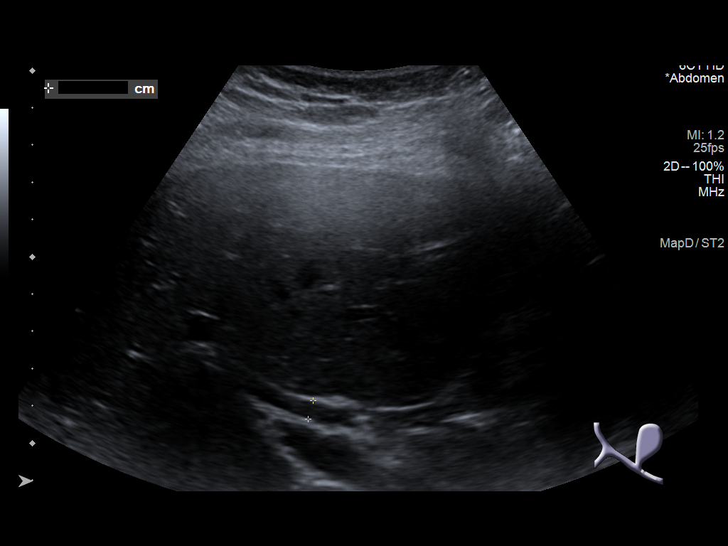
[im 59/109]
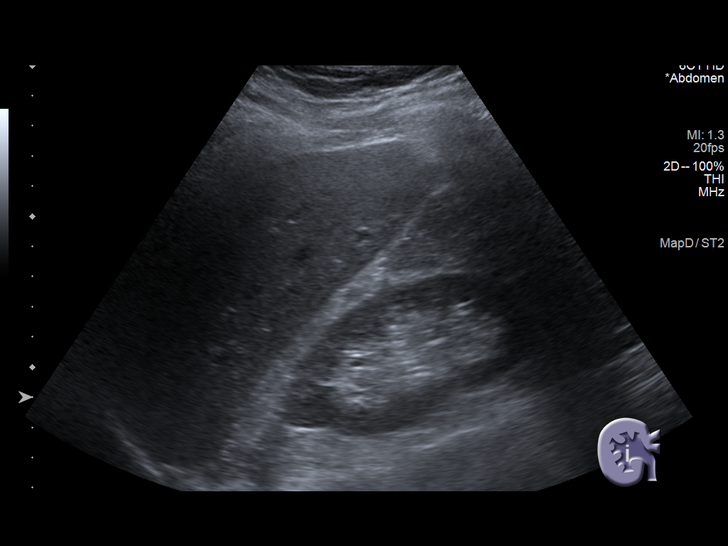
[im 68/109]
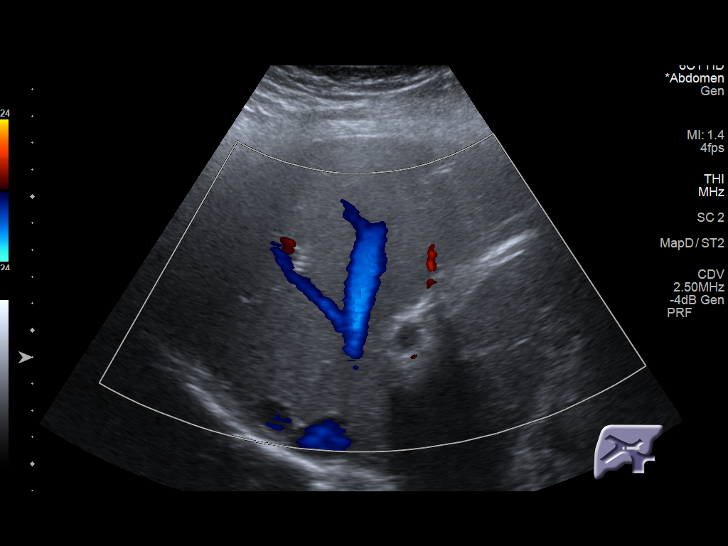
[im 73/109]
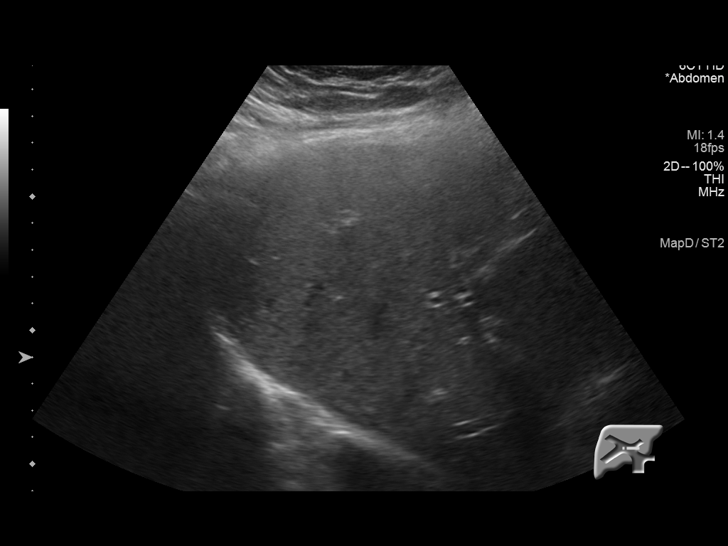
[im 82/109]
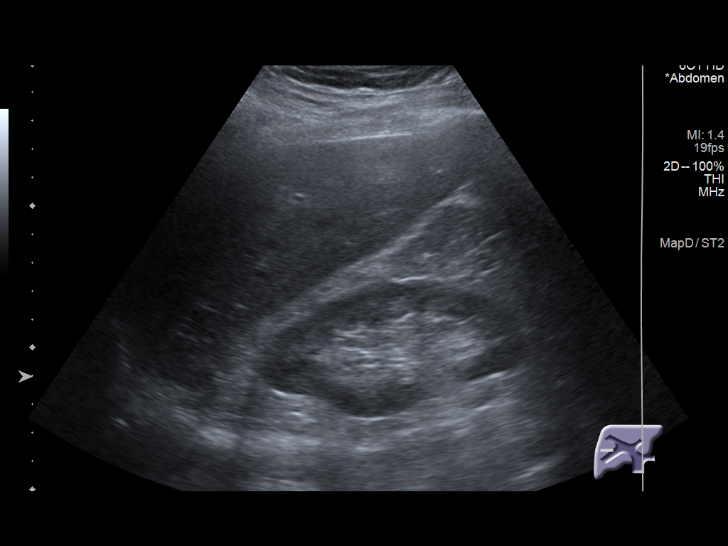
[im 91/109]
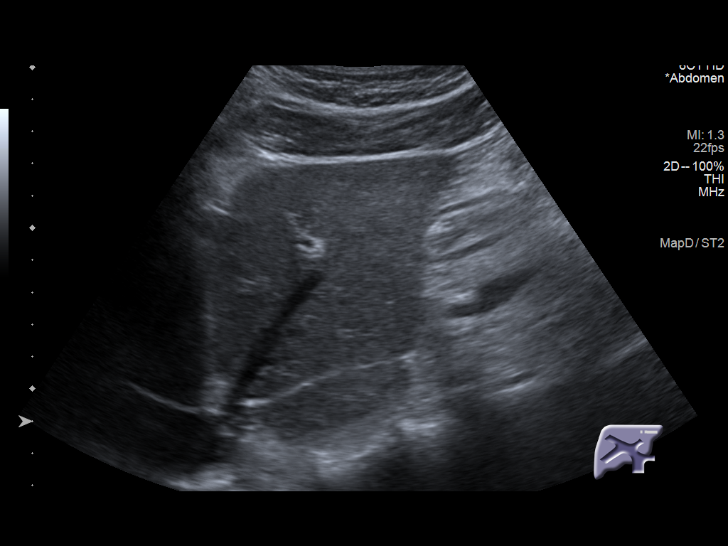
[im 100/109]
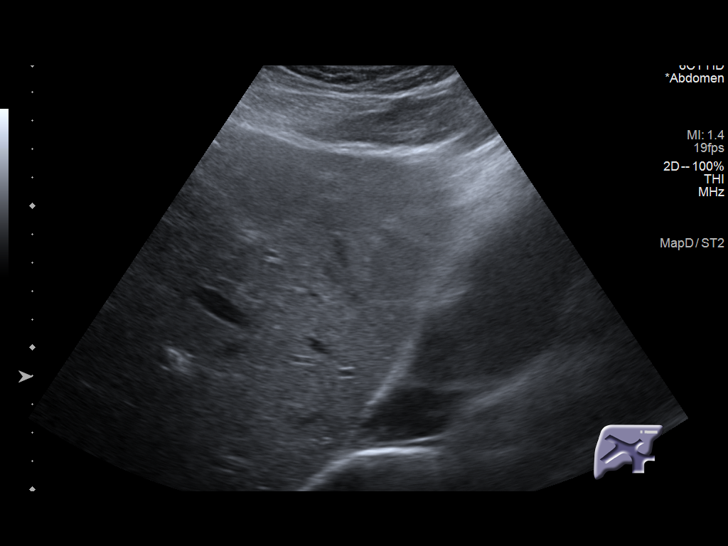
[im 109/109]
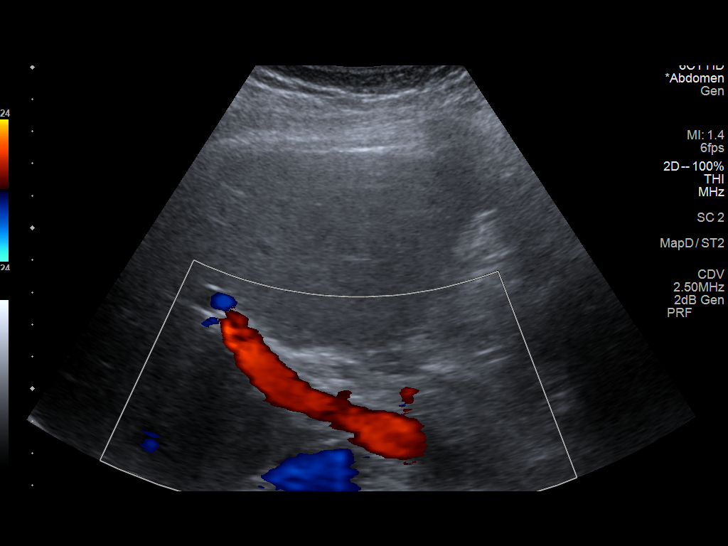

[14 of 25 positions shown; findings below may reference images not displayed]

FINDINGS: Gallbladder:

Within the gallbladder, there are echogenic foci which move and
shadow consistent with cholelithiasis. Largest individual gallstone
measures 7 mm. Two gallbladder wall appears thickened and somewhat
edematous. There is no appreciable pericholecystic fluid. No
sonographic Murphy sign noted by sonographer.

Common bile duct:

Diameter: 5 mm. No intrahepatic or extrahepatic biliary duct
dilatation.

Liver:

No focal lesion identified. Liver echogenicity overall mildly
increased. Portal vein is patent on color Doppler imaging with
normal direction of blood flow towards the liver.

Other: None.
IMPRESSION: 1. Cholelithiasis with gallbladder wall thickening. Concern for a
degree of acute cholecystitis. This finding may warrant correlation
with nuclear medicine hepatobiliary imaging study to assess for
cystic duct patency.

2. Suspect a degree of hepatic steatosis. No focal liver lesions
evident.

## 2021-05-29 ENCOUNTER — Ambulatory Visit
Admission: EM | Admit: 2021-05-29 | Discharge: 2021-05-29 | Disposition: A | Discharge status: Home / Self Care | Payer: BC Managed Care – PPO | Attending: Urgent Care | Admitting: Urgent Care

## 2021-05-29 ENCOUNTER — Other Ambulatory Visit: Payer: Self-pay

## 2021-05-29 DIAGNOSIS — N50812 Left testicular pain: Secondary | ICD-10-CM | POA: Diagnosis not present

## 2021-05-29 DIAGNOSIS — N451 Epididymitis: Secondary | ICD-10-CM | POA: Diagnosis not present

## 2021-05-29 MED ORDER — LEVOFLOXACIN 500 MG PO TABS: 500.0000 mg | ORAL_TABLET | Freq: Every day | ORAL | 0 refills | Status: DC

## 2021-05-29 NOTE — ED Provider Notes (Signed)
?[facility] ? ? ?MRN: [id number] DOB: [birth year] ? ?Subjective:  ? ?Gilbert Meyers is a 36 y.o. male presenting for 5-day history of acute onset persistent intermittent left testicle swelling with associated pain.  Cannot recall any particular traumas.  Does not play sports.  Denies dysuria, hematuria, urinary frequency, penile discharge, penile swelling, anal pain, groin pain.  No concern for an STI, does not have any sex partners and is not sexually active with any people but he does practice masturbation.  He does do a lot of heavy lifting for his work. ? ? ?No current facility-administered medications for this encounter. ? ?Current Outpatient Medications:  ?  benztropine (COGENTIN) 0.5 MG tablet, Take 1 tablet (0.5 mg total) by mouth 2 (two) times daily. For prevention of drug induced tremors, Disp: 60 tablet, Rfl: 0 ?  hydrOXYzine (ATARAX/VISTARIL) 25 MG tablet, Take 1 tablet (25 mg total) by mouth every 6 (six) hours as needed for anxiety., Disp: 60 tablet, Rfl: 0 ?  hydrOXYzine (ATARAX/VISTARIL) 50 MG tablet, Take 50 mg by mouth 3 (three) times daily as needed for anxiety., Disp: , Rfl:  ?  ondansetron (ZOFRAN) 4 MG tablet, Take 1 tablet (4 mg total) by mouth every 6 (six) hours as needed for nausea., Disp: 20 tablet, Rfl: 0 ?  oxyCODONE (OXY IR/ROXICODONE) 5 MG immediate release tablet, Take 1 tablet (5 mg total) by mouth every 4 (four) hours as needed for severe pain or breakthrough pain., Disp: 30 tablet, Rfl: 0 ?  risperiDONE (RISPERDAL) 0.5 MG tablet, Take 1 tablet (0.5 mg) by mouth in the morning & 2 tablets (1 mg) at bedtime: For mood control, Disp: 90 tablet, Rfl: 0 ?  traZODone (DESYREL) 100 MG tablet, Take 1 tablet (100 mg total) by mouth at bedtime as needed for sleep., Disp: 30 tablet, Rfl: 0  ? ?Allergies  ?Allergen Reactions  ? Cymbalta [Duloxetine Hcl]   ?  "makes him feel awful"  ? ? ?Past Medical History:  ?Diagnosis Date  ? Anxiety   ? Bipolar 1 disorder ([facility])   ?  Schizophrenia ([facility])   ?  ? ?Past Surgical History:  ?Procedure Laterality Date  ? CHOLECYSTECTOMY N/A 02/02/2019  ? Procedure: LAPAROSCOPIC CHOLECYSTECTOMY;  Surgeon: Lucretia Roers, MD;  Location: [facility];  Service: General;  Laterality: N/A;  ? ? ?Family History  ?Problem Relation Age of Onset  ? Prostate cancer Paternal Grandfather   ? Colon cancer Neg Hx   ? ? ?Social History  ? ?Tobacco Use  ? Smoking status: Former  ?  Packs/day: 0.10  ?  Types: E-cigarettes, Cigarettes  ? Smokeless tobacco: Never  ?Vaping Use  ? Vaping Use: Former  ?Substance Use Topics  ? Alcohol use: No  ?  Comment: former  ? Drug use: Yes  ?  Types: Marijuana  ? ? ?ROS ? ? ?Objective:  ? ?Vitals: ?BP 120/83   Pulse (!) 58   Temp 98 ?F (36.7 ?C)   Resp 20   SpO2 98%  ? ?Physical Exam ?Constitutional:   ?   General: He is not in acute distress. ?   Appearance: Normal appearance. He is well-developed and normal weight. He is not ill-appearing, toxic-appearing or diaphoretic.  ?HENT:  ?   Head: Normocephalic and atraumatic.  ?   Right Ear: External ear normal.  ?   Left Ear: External ear normal.  ?   Nose: Nose normal.  ?   Mouth/Throat:  ?   Pharynx: Oropharynx is clear.  ?  Eyes:  ?   General: No scleral icterus.    ?   Right eye: No discharge.     ?   Left eye: No discharge.  ?   Extraocular Movements: Extraocular movements intact.  ?Cardiovascular:  ?   Rate and Rhythm: Normal rate.  ?Pulmonary:  ?   Effort: Pulmonary effort is normal.  ?Abdominal:  ?   Hernia: There is no hernia in the left inguinal area or right inguinal area.  ?Genitourinary: ?   Penis: No phimosis, paraphimosis, hypospadias, erythema, tenderness, discharge, swelling or lesions.   ?   Testes:     ?   Right: Mass, tenderness, swelling, testicular hydrocele or varicocele not present. Right testis is descended. Cremasteric reflex is present.      ?   Left: Mass, tenderness, swelling, testicular hydrocele or varicocele not present. Left testis is descended. Cremasteric  reflex is present.   ?   Epididymis:  ?   Right: Not inflamed or enlarged. No mass or tenderness.  ?   Left: Not inflamed or enlarged. Tenderness present. No mass.  ?Musculoskeletal:  ?   Cervical back: Normal range of motion.  ?Lymphadenopathy:  ?   Lower Body: No right inguinal adenopathy. No left inguinal adenopathy.  ?Neurological:  ?   Mental Status: He is alert and oriented to person, place, and time.  ?Psychiatric:     ?   Mood and Affect: Mood normal.     ?   Behavior: Behavior normal.     ?   Thought Content: Thought content normal.     ?   Judgment: Judgment normal.  ? ? ?Assessment and Plan :  ? ?PDMP not reviewed this encounter. ? ?1. Epididymitis, left   ?2. Testicular pain, left   ? ?No signs of testicular torsion.  Counseled that we will manage him for epididymitis of the left testicle with levofloxacin.  Urine culture pending.  Recommended supportive care otherwise, limiting his lifting at work especially while on levofloxacin.  Follow-up with [facility] if swelling of the groin area persists, patient may need work-up for rule out of hernia given all the heavy lifting he does not work. Counseled patient on potential for adverse effects with medications prescribed/recommended today, ER and return-to-clinic precautions discussed, patient verbalized understanding. ? ?  ?Wallis Bamberg, PA-C ?05/29/21 1526 ? ?

## 2021-05-29 NOTE — ED Triage Notes (Signed)
Pt presents with left testicle pain since last Wednesday  ?

## 2021-05-31 LAB — URINE CULTURE: Culture: NO GROWTH

## 2021-06-06 ENCOUNTER — Emergency Department (HOSPITAL_COMMUNITY): Payer: BC Managed Care – PPO

## 2021-06-06 ENCOUNTER — Emergency Department (HOSPITAL_COMMUNITY)
Admission: EM | Admit: 2021-06-06 | Discharge: 2021-06-06 | Disposition: A | Discharge status: Home / Self Care | Payer: BC Managed Care – PPO | Attending: Emergency Medicine | Admitting: Emergency Medicine

## 2021-06-06 ENCOUNTER — Other Ambulatory Visit: Payer: Self-pay

## 2021-06-06 ENCOUNTER — Encounter (HOSPITAL_COMMUNITY): Payer: Self-pay

## 2021-06-06 DIAGNOSIS — Y92513 Shop (commercial) as the place of occurrence of the external cause: Secondary | ICD-10-CM | POA: Insufficient documentation

## 2021-06-06 DIAGNOSIS — Y990 Civilian activity done for income or pay: Secondary | ICD-10-CM | POA: Diagnosis not present

## 2021-06-06 DIAGNOSIS — N50812 Left testicular pain: Secondary | ICD-10-CM | POA: Insufficient documentation

## 2021-06-06 DIAGNOSIS — D72829 Elevated white blood cell count, unspecified: Secondary | ICD-10-CM | POA: Diagnosis not present

## 2021-06-06 DIAGNOSIS — Z87891 Personal history of nicotine dependence: Secondary | ICD-10-CM | POA: Diagnosis not present

## 2021-06-06 LAB — CBC WITH DIFFERENTIAL/PLATELET
Abs Immature Granulocytes: 0.03 10*3/uL (ref 0.00–0.07)
Basophils Absolute: 0.1 10*3/uL (ref 0.0–0.1)
Basophils Relative: 0 %
Eosinophils Absolute: 0 10*3/uL (ref 0.0–0.5)
Eosinophils Relative: 0 %
HCT: 46.9 % (ref 39.0–52.0)
Hemoglobin: 16.1 g/dL (ref 13.0–17.0)
Immature Granulocytes: 0 %
Lymphocytes Relative: 14 %
Lymphs Abs: 1.6 10*3/uL (ref 0.7–4.0)
MCH: 30.5 pg (ref 26.0–34.0)
MCHC: 34.3 g/dL (ref 30.0–36.0)
MCV: 88.8 fL (ref 80.0–100.0)
Monocytes Absolute: 0.4 10*3/uL (ref 0.1–1.0)
Monocytes Relative: 4 %
Neutro Abs: 9.7 10*3/uL — ABNORMAL HIGH (ref 1.7–7.7)
Neutrophils Relative %: 82 %
Platelets: 349 10*3/uL (ref 150–400)
RBC: 5.28 MIL/uL (ref 4.22–5.81)
RDW: 13.2 % (ref 11.5–15.5)
WBC: 11.8 10*3/uL — ABNORMAL HIGH (ref 4.0–10.5)
nRBC: 0 % (ref 0.0–0.2)

## 2021-06-06 LAB — URINALYSIS, ROUTINE W REFLEX MICROSCOPIC
Bilirubin Urine: NEGATIVE
Glucose, UA: NEGATIVE mg/dL
Hgb urine dipstick: NEGATIVE
Ketones, ur: 20 mg/dL — AB
Leukocytes,Ua: NEGATIVE
Nitrite: NEGATIVE
Protein, ur: NEGATIVE mg/dL
Specific Gravity, Urine: 1.033 — ABNORMAL HIGH (ref 1.005–1.030)
pH: 5 (ref 5.0–8.0)

## 2021-06-06 LAB — BASIC METABOLIC PANEL
Anion gap: 8 (ref 5–15)
BUN: 14 mg/dL (ref 6–20)
CO2: 23 mmol/L (ref 22–32)
Calcium: 9.3 mg/dL (ref 8.9–10.3)
Chloride: 109 mmol/L (ref 98–111)
Creatinine, Ser: 0.87 mg/dL (ref 0.61–1.24)
GFR, Estimated: >60 mL/min (ref >60)
Glucose, Bld: 116 mg/dL — ABNORMAL HIGH (ref 70–99)
Potassium: 3.8 mmol/L (ref 3.5–5.1)
Sodium: 140 mmol/L (ref 135–145)

## 2021-06-06 MED ORDER — IBUPROFEN 800 MG PO TABS: 800.0000 mg | ORAL_TABLET | Freq: Three times a day (TID) | ORAL | 0 refills | Status: DC

## 2021-06-06 NOTE — ED Triage Notes (Signed)
Pt reports L testicular pain x1 week. Seen last week at [facility] and was given levofloxacin and had some relief. Was told he needs a CT scan. ?

## 2021-06-06 NOTE — ED Notes (Signed)
I provided reinforced discharge education based off of discharge instructions. Pt acknowledged and understood my education. Pt had no further questions/concerns for provider/myself.  °

## 2021-06-06 NOTE — ED Provider Notes (Signed)
?Gilbert Meyers ?Provider Note ? ? ?CSN: [id number] ?Arrival date & time: 06/06/21  1456 ? ?  ? ?History ?Chief Complaint  ?Patient presents with  ? Testicle Pain  ? ? ?Gilbert Meyers is a 36 y.o. male who presents to the emergency department with left-sided testicular pain started roughly a week and a half ago.  Patient was initially seen evaluated urgent care was thought to be epididymitis.  Patient was started on levofloxacin which did not improve his pain.  Pain has been constant since onset.  Taking Tylenol with little improvement.  He denies any urinary complaints.  Patient does work at [employer] and does a lot of heavy lifting.  No STD risk factors.  No fever or chills. ? ? ?Testicle Pain ? ? ?  ? ?Home Medications ?Prior to Admission medications   ?Medication Sig Start Date End Date Taking? Authorizing Provider  ?ibuprofen (ADVIL) 800 MG tablet Take 1 tablet (800 mg total) by mouth 3 (three) times daily. 06/06/21  Yes Raul Del, Jazzelle Zhang M, PA-C  ?benztropine (COGENTIN) 0.5 MG tablet Take 1 tablet (0.5 mg total) by mouth 2 (two) times daily. For prevention of drug induced tremors 06/10/17   Lindell Spar I, NP  ?hydrOXYzine (ATARAX/VISTARIL) 25 MG tablet Take 1 tablet (25 mg total) by mouth every 6 (six) hours as needed for anxiety. 06/10/17   Lindell Spar I, NP  ?hydrOXYzine (ATARAX/VISTARIL) 50 MG tablet Take 50 mg by mouth 3 (three) times daily as needed for anxiety.    [provider]  ?levofloxacin (LEVAQUIN) 500 MG tablet Take 1 tablet (500 mg total) by mouth daily. 05/29/21   Jaynee Eagles, PA-C  ?ondansetron (ZOFRAN) 4 MG tablet Take 1 tablet (4 mg total) by mouth every 6 (six) hours as needed for nausea. 02/03/19   Virl Cagey, MD  ?oxyCODONE (OXY IR/ROXICODONE) 5 MG immediate release tablet Take 1 tablet (5 mg total) by mouth every 4 (four) hours as needed for severe pain or breakthrough pain. 02/03/19   Virl Cagey, MD  ?risperiDONE (RISPERDAL) 0.5 MG tablet Take 1  tablet (0.5 mg) by mouth in the morning & 2 tablets (1 mg) at bedtime: For mood control 06/10/17   Lindell Spar I, NP  ?traZODone (DESYREL) 100 MG tablet Take 1 tablet (100 mg total) by mouth at bedtime as needed for sleep. 06/10/17   Encarnacion Slates, NP  ?   ? ?Allergies    ?Cymbalta [duloxetine hcl]   ? ?Review of Systems   ?Review of Systems  ?Genitourinary:  Positive for testicular pain.  ?All other systems reviewed and are negative. ? ?Physical Exam ?Updated Vital Signs ?BP (!) 140/116 (BP Location: Right Arm)   Pulse 78   Temp 98.3 ?F (36.8 ?C) (Oral)   Resp 19   Ht 5\' 10"  (1.778 m)   Wt 88.9 kg   SpO2 98%   BMI 28.12 kg/m?  ?Physical Exam ?Vitals and nursing note reviewed.  ?Constitutional:   ?   General: He is not in acute distress. ?   Appearance: Normal appearance.  ?HENT:  ?   Head: Normocephalic and atraumatic.  ?Eyes:  ?   General:     ?   Right eye: No discharge.     ?   Left eye: No discharge.  ?Cardiovascular:  ?   Comments: Regular rate and rhythm.  S1/S2 are distinct without any evidence of murmur, rubs, or gallops.  Radial pulses are 2+ bilaterally.  Dorsalis pedis pulses are  2+ bilaterally.  No evidence of pedal edema. ?Pulmonary:  ?   Comments: Clear to auscultation bilaterally.  Normal effort.  No respiratory distress.  No evidence of wheezes, rales, or rhonchi heard throughout. ?Abdominal:  ?   General: Abdomen is flat. Bowel sounds are normal. There is no distension.  ?   Tenderness: There is no abdominal tenderness. There is no guarding or rebound.  ?Genitourinary: ?   Pubic Area: No rash.   ?   Penis: No phimosis, paraphimosis, hypospadias or tenderness.   ?   Testes: Normal.     ?   Right: Tenderness, swelling or testicular hydrocele not present.     ?   Left: Tenderness, swelling or testicular hydrocele not present.  ?Musculoskeletal:     ?   General: Normal range of motion.  ?   Cervical back: Neck supple.  ?Skin: ?   General: Skin is warm and dry.  ?   Findings: No rash.   ?Neurological:  ?   General: No focal deficit present.  ?   Mental Status: He is alert.  ?Psychiatric:     ?   Mood and Affect: Mood normal.     ?   Behavior: Behavior normal.  ? ? ?ED Results / Procedures / Treatments   ?Labs ?(all labs ordered are listed, but only abnormal results are displayed) ?Labs Reviewed  ?CBC WITH DIFFERENTIAL/PLATELET - Abnormal; Notable for the following components:  ?    Result Value  ? WBC 11.8 (*)   ? Neutro Abs 9.7 (*)   ? All other components within normal limits  ?BASIC METABOLIC PANEL - Abnormal; Notable for the following components:  ? Glucose, Bld 116 (*)   ? All other components within normal limits  ?URINALYSIS, ROUTINE W REFLEX MICROSCOPIC - Abnormal; Notable for the following components:  ? Specific Gravity, Urine 1.033 (*)   ? Ketones, ur 20 (*)   ? All other components within normal limits  ? ? ?EKG ?None ? ?Radiology ?US SCROTUM W/DOPPLER ? ?Result Date: 06/06/2021 ?CLINICAL DATA:  Left testicular pain EXAM: SCROTAL ULTRASOUND DOPPLER ULTRASOUND OF THE TESTICLES TECHNIQUE: Complete ultrasound examination of the testicles, epididymis, and other scrotal structures was performed. Color and spectral Doppler ultrasound were also utilized to evaluate blood flow to the testicles. COMPARISON:  None. FINDINGS: Right testicle Measurements: 3.6 x 2.6 x 2.9 cm. No mass or microlithiasis visualized. Left testicle Measurements: 3.9 x 2.1 x 2.9 cm. No mass or microlithiasis visualized. Right epididymis:  Normal in size and appearance. Left epididymis:  Normal in size and appearance. Hydrocele:  None visualized. Varicocele:  None visualized. Pulsed Doppler interrogation of both testes demonstrates normal low resistance arterial and venous waveforms bilaterally. IMPRESSION: No sonographic abnormality is seen in both testes. Electronically Signed   By: Elmer Picker M.D.   On: 06/06/2021 16:23   ? ?Procedures ?Procedures  ? ? ?Medications Ordered in ED ?Medications - No data to  display ? ?ED Course/ Medical Decision Making/ A&P ?  ?                        ?Medical Decision Making ?Amount and/or Complexity of Data Reviewed ?Labs: ordered. ?Radiology: ordered. ? ? ?This patient presents to the ED for concern of left testicular pain, this involves an extensive number of treatment options, and is a complaint that carries with it a high risk of complications and morbidity.  The differential diagnosis includes torsion versus varicocele versus UTI  versus hernia versus epididymitis/orchitis. ? ? ?Co morbidities that complicate the patient evaluation ? ?Past Medical History:  ?Diagnosis Date  ? Anxiety   ? Bipolar 1 disorder ([place])   ? Schizophrenia ([place])   ? ?Additional history obtained: ? ?Additional history obtained from nursing note  ?External records from outside source obtained and reviewed including urgent care visit today where he was seen evaluated and sent to the ED for further evaluation. ? ? ?Lab Tests: ? ?I Ordered, and personally interpreted labs.  The pertinent results include: CBC shows evidence of leukocytosis otherwise normal.  BMP is normal.  UA is normal. ? ? ?Imaging Studies ordered: ? ?I ordered imaging studies including ultrasound of the scrotum ?I independently visualized and interpreted imaging which showed no evidence of torsion or epididymitis.  No evidence of varicocele. ?I agree with the radiologist interpretation ? ? ?Cardiac Monitoring: ? ?The patient was maintained on a cardiac monitor.  I personally viewed and interpreted the cardiac monitored which showed an underlying rhythm of: normal sinus rhythm  ? ? ?Medicines ordered and prescription drug management: ? ?None ? ? ?Problem List / ED Course: ? ?Testicular pain.  Do not feel any hernias or masses on my exam.  No evidence of varicocele.  Ultrasound was negative for torsion and epididymitis.  Will prescribe him a high milligrams of ibuprofen and have him get a jockstrap and follow-up with urology for further  evaluation.  Strict return precautions were discussed with him.  He was amenable this plan.  Safe for discharge. ? ? ?Reevaluation: ? ?After the interventions noted above, I reevaluated the patient and found that they have :improved

## 2021-06-06 NOTE — ED Provider Triage Note (Signed)
Emergency Medicine Provider Triage Evaluation Note ? ?Gilbert Meyers , a 35 y.o. male  was evaluated in triage.  Pt complains of left testicular pain has been ongoing for about 4 to 5 days.  Was seen evaluated at urgent care and given levofloxacin but not getting better.  Was sent to the emergency department for evaluation. ? ?Review of Systems  ?Positive:  ?Negative: See above  ? ?Physical Exam  ?BP (!) 140/116 (BP Location: Right Arm)   Pulse 78   Temp 98.3 ?F (36.8 ?C) (Oral)   Resp 19   Ht 5\' 10"  (1.778 m)   Wt 88.9 kg   SpO2 98%   BMI 28.12 kg/m?  ?Gen:   Awake, no distress   ?Resp:  Normal effort  ?MSK:   Moves extremities without difficulty  ?Other:   ? ?Medical Decision Making  ?Medically screening exam initiated at 3:16 PM.  Appropriate orders placed.  Gilbert Meyers was informed that the remainder of the evaluation will be completed by another provider, this initial triage assessment does not replace that evaluation, and the importance of remaining in the ED until their evaluation is complete. ? ? ?  ?Gilbert Graham, PA-C ?06/06/21 1518 ? ?

## 2021-06-06 NOTE — Discharge Instructions (Addendum)
Please follow-up with urology for further evaluation.  Return to the emergency department for any worsening symptoms. ?

## 2022-05-29 ENCOUNTER — Other Ambulatory Visit: Payer: Self-pay

## 2022-05-29 ENCOUNTER — Emergency Department (EMERGENCY_DEPARTMENT_HOSPITAL)
Admission: EM | Admit: 2022-05-29 | Discharge: 2022-05-30 | Disposition: A | Discharge status: Home / Self Care | Payer: BC Managed Care – PPO | Source: Home / Self Care | Attending: Emergency Medicine | Admitting: Emergency Medicine

## 2022-05-29 DIAGNOSIS — Z79899 Other long term (current) drug therapy: Secondary | ICD-10-CM | POA: Insufficient documentation

## 2022-05-29 DIAGNOSIS — F209 Schizophrenia, unspecified: Secondary | ICD-10-CM | POA: Diagnosis not present

## 2022-05-29 DIAGNOSIS — Z20822 Contact with and (suspected) exposure to covid-19: Secondary | ICD-10-CM | POA: Insufficient documentation

## 2022-05-29 DIAGNOSIS — F29 Unspecified psychosis not due to a substance or known physiological condition: Secondary | ICD-10-CM | POA: Insufficient documentation

## 2022-05-29 LAB — CBC WITH DIFFERENTIAL/PLATELET
Abs Immature Granulocytes: 0.02 10*3/uL (ref 0.00–0.07)
Basophils Absolute: 0.1 10*3/uL (ref 0.0–0.1)
Basophils Relative: 1 %
Eosinophils Absolute: 0.1 10*3/uL (ref 0.0–0.5)
Eosinophils Relative: 1 %
HCT: 46 % (ref 39.0–52.0)
Hemoglobin: 15.5 g/dL (ref 13.0–17.0)
Immature Granulocytes: 0 %
Lymphocytes Relative: 26 %
Lymphs Abs: 1.8 10*3/uL (ref 0.7–4.0)
MCH: 30.3 pg (ref 26.0–34.0)
MCHC: 33.7 g/dL (ref 30.0–36.0)
MCV: 89.8 fL (ref 80.0–100.0)
Monocytes Absolute: 0.4 10*3/uL (ref 0.1–1.0)
Monocytes Relative: 6 %
Neutro Abs: 4.3 10*3/uL (ref 1.7–7.7)
Neutrophils Relative %: 66 %
Platelets: 263 10*3/uL (ref 150–400)
RBC: 5.12 MIL/uL (ref 4.22–5.81)
RDW: 12.6 % (ref 11.5–15.5)
WBC: 6.7 10*3/uL (ref 4.0–10.5)
nRBC: 0 % (ref 0.0–0.2)

## 2022-05-29 LAB — URINALYSIS, W/ REFLEX TO CULTURE (INFECTION SUSPECTED)
Bacteria, UA: NONE SEEN
Bilirubin Urine: NEGATIVE
Glucose, UA: NEGATIVE mg/dL
Hgb urine dipstick: NEGATIVE
Ketones, ur: NEGATIVE mg/dL
Leukocytes,Ua: NEGATIVE
Nitrite: NEGATIVE
Protein, ur: NEGATIVE mg/dL
Specific Gravity, Urine: 1.023 (ref 1.005–1.030)
pH: 6 (ref 5.0–8.0)

## 2022-05-29 LAB — RAPID URINE DRUG SCREEN, HOSP PERFORMED
Amphetamines: NOT DETECTED
Barbiturates: NOT DETECTED
Benzodiazepines: NOT DETECTED
Cocaine: NOT DETECTED
Opiates: NOT DETECTED
Tetrahydrocannabinol: POSITIVE — AB

## 2022-05-29 LAB — COMPREHENSIVE METABOLIC PANEL
ALT: 18 U/L (ref 0–44)
AST: 22 U/L (ref 15–41)
Albumin: 4.2 g/dL (ref 3.5–5.0)
Alkaline Phosphatase: 57 U/L (ref 38–126)
Anion gap: 10 (ref 5–15)
BUN: 12 mg/dL (ref 6–20)
CO2: 22 mmol/L (ref 22–32)
Calcium: 8.7 mg/dL — ABNORMAL LOW (ref 8.9–10.3)
Chloride: 105 mmol/L (ref 98–111)
Creatinine, Ser: 0.84 mg/dL (ref 0.61–1.24)
GFR, Estimated: >60 mL/min (ref >60)
Glucose, Bld: 114 mg/dL — ABNORMAL HIGH (ref 70–99)
Potassium: 3.7 mmol/L (ref 3.5–5.1)
Sodium: 137 mmol/L (ref 135–145)
Total Bilirubin: 1.2 mg/dL (ref 0.3–1.2)
Total Protein: 6.6 g/dL (ref 6.5–8.1)

## 2022-05-29 LAB — ETHANOL: Alcohol, Ethyl (B): <10 mg/dL (ref <10)

## 2022-05-29 MED ORDER — RISPERIDONE 1 MG PO TABS: 1.0000 mg | ORAL_TABLET | ORAL | Status: DC

## 2022-05-29 MED ORDER — BENZTROPINE MESYLATE 1 MG PO TABS
0.5000 mg | ORAL_TABLET | Freq: Two times a day (BID) | ORAL | Status: DC
  Administered 2022-05-29 – 2022-05-30 (×2): 0.5 mg via ORAL
  Filled 2022-05-29 (×2): qty 1

## 2022-05-29 MED ORDER — RISPERIDONE 1 MG PO TABS
1.0000 mg | ORAL_TABLET | Freq: Every day | ORAL | Status: DC
  Administered 2022-05-29: 1 mg via ORAL
  Filled 2022-05-29: qty 1

## 2022-05-29 MED ORDER — RISPERIDONE 1 MG PO TABS
1.0000 mg | ORAL_TABLET | Freq: Every day | ORAL | Status: DC
  Administered 2022-05-30: 1 mg via ORAL
  Filled 2022-05-29: qty 4

## 2022-05-29 MED ORDER — HYDROXYZINE HCL 25 MG PO TABS: 50.0000 mg | ORAL_TABLET | Freq: Three times a day (TID) | ORAL | Status: DC | PRN

## 2022-05-29 MED ORDER — IBUPROFEN 800 MG PO TABS
800.0000 mg | ORAL_TABLET | Freq: Three times a day (TID) | ORAL | Status: DC
  Administered 2022-05-29 – 2022-05-30 (×2): 800 mg via ORAL
  Filled 2022-05-29 (×2): qty 1

## 2022-05-29 MED ORDER — HYDROXYZINE HCL 25 MG PO TABS
25.0000 mg | ORAL_TABLET | Freq: Once | ORAL | Status: AC
  Administered 2022-05-29: 25 mg via ORAL
  Filled 2022-05-29: qty 1

## 2022-05-29 NOTE — ED Notes (Signed)
Pt dressed in appropriate psych apparel , psych room cleared of potentially harmful objects and door to cables and monitoring screen shut. Clothes, shoes, rubber bracelets put in pt belongings bag and placed in locker. Security wanded pt

## 2022-05-29 NOTE — ED Notes (Signed)
Per Fatima Sanger, PA-C, after speaking with father, he informed pt has been threatening to kill others online

## 2022-05-29 NOTE — ED Provider Notes (Signed)
[place] Provider Note   CSN: [id number] Arrival date & time: 05/29/22  1326     History  Chief Complaint  Patient presents with   Psychiatric Evaluation    Coleby Borghese is a 37 y.o. male, hx of schizophrenia, who presents to the ED after an argument with his father today. He states his father just wants him gone.  Denies SI/HI. Denies physical aggression towards father.     Home Medications Prior to Admission medications   Medication Sig Start Date End Date Taking? Authorizing Provider  hydrOXYzine (ATARAX/VISTARIL) 50 MG tablet Take 50 mg by mouth 3 (three) times daily as needed for anxiety.   Yes [provider]  ibuprofen (ADVIL) 800 MG tablet Take 1 tablet (800 mg total) by mouth 3 (three) times daily. 06/06/21  Yes Tacy Learn, PA-C  risperiDONE (RISPERDAL) 1 MG tablet Take 1 mg by mouth See admin instructions. 1 tablet in the morning and 1-2 every evening   Yes [provider]  benztropine (COGENTIN) 0.5 MG tablet Take 1 tablet (0.5 mg total) by mouth 2 (two) times daily. For prevention of drug induced tremors Patient not taking: Reported on 05/29/2022 06/10/17   Encarnacion Slates, NP      Allergies    Cymbalta [duloxetine hcl]    Review of Systems   Review of Systems  Psychiatric/Behavioral:  Negative for hallucinations and suicidal ideas.     Physical Exam Updated Vital Signs BP 100/87 (BP Location: Right Arm)   Pulse 88   Temp 97.7 F (36.5 C) (Oral)   Resp 16   Ht 5\' 10"  (1.778 m)   Wt 82.1 kg   SpO2 97%   BMI 25.97 kg/m  Physical Exam Vitals and nursing note reviewed.  Constitutional:      General: He is not in acute distress.    Appearance: He is well-developed.  HENT:     Head: Normocephalic and atraumatic.  Eyes:     Conjunctiva/sclera: Conjunctivae normal.  Cardiovascular:     Rate and Rhythm: Normal rate and regular rhythm.     Heart sounds: No murmur heard. Pulmonary:      Effort: Pulmonary effort is normal. No respiratory distress.     Breath sounds: Normal breath sounds.  Abdominal:     Palpations: Abdomen is soft.     Tenderness: There is no abdominal tenderness.  Musculoskeletal:        General: No swelling.     Cervical back: Neck supple.  Skin:    General: Skin is warm and dry.     Capillary Refill: Capillary refill takes less than 2 seconds.  Neurological:     Mental Status: He is alert.  Psychiatric:        Mood and Affect: Mood normal.     ED Results / Procedures / Treatments   Labs (all labs ordered are listed, but only abnormal results are displayed) Labs Reviewed  COMPREHENSIVE METABOLIC PANEL - Abnormal; Notable for the following components:      Result Value   Glucose, Bld 114 (*)    Calcium 8.7 (*)    All other components within normal limits  URINALYSIS, W/ REFLEX TO CULTURE (INFECTION SUSPECTED) - Abnormal; Notable for the following components:   APPearance HAZY (*)    All other components within normal limits  RAPID URINE DRUG SCREEN, HOSP PERFORMED - Abnormal; Notable for the following components:   Tetrahydrocannabinol POSITIVE (*)    All other  components within normal limits  SARS CORONAVIRUS 2 BY RT PCR  CBC WITH DIFFERENTIAL/PLATELET  ETHANOL    EKG None  Radiology No results found.  Procedures Procedures    Medications Ordered in ED Medications  hydrOXYzine (ATARAX) tablet 25 mg (has no administration in time range)  benztropine (COGENTIN) tablet 0.5 mg (has no administration in time range)  hydrOXYzine (ATARAX) tablet 50 mg (has no administration in time range)  ibuprofen (ADVIL) tablet 800 mg (has no administration in time range)  risperiDONE (RISPERDAL) tablet 1 mg (has no administration in time range)    ED Course/ Medical Decision Making/ A&P                             Medical Decision Making Patient is a 37 year old male, history of schizophrenia, brought in police for an argument with his  father.  Spoke with father on the phone, states that Karver has a history of schizophrenia, and that he has not been taking his medications last few weeks.  He states that he has become increasingly more volatile, and is hallucinating frequently.  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  Father also states that [place]wan has been talking to things that are not real, and he saw this on their ring doorbell cam.  States he called the police because he has not seen during this bad in 8 to 10 years.  Also states "I would have had him committed at the court house, but did not have time to do this" as he is taking care of his 63 year old father-in-law.  Amount and/or Complexity of Data Reviewed Labs: ordered.    Details: Positive for THC Discussion of management or test interpretation with external provider(s): Unremarkable labs, per father has been posting things about wanting to kill people on Facebook.  Has not also been taking meds.  Believe that he will benefit from psychiatric evaluation.  Medically cleared, to be evaluated by psychiatry.  Risk Prescription drug management.   Final Clinical Impression(s) / ED Diagnoses Final diagnoses:  Schizophrenia, unspecified type [facility])    Rx / DC Orders ED Discharge Orders     None         Kyan Giannone, Si Gaul, PA 05/29/22 2025    Milton Ferguson, MD 05/31/22 1059

## 2022-05-29 NOTE — ED Triage Notes (Signed)
Pt denies any SI or HI, states he and his dad had an argument and pt states dad called the police on him. Pt state he has been taking his meds for the most part.

## 2022-05-30 ENCOUNTER — Other Ambulatory Visit: Payer: Self-pay

## 2022-05-30 ENCOUNTER — Encounter (HOSPITAL_COMMUNITY): Payer: Self-pay | Admitting: Psychiatry

## 2022-05-30 ENCOUNTER — Inpatient Hospital Stay (HOSPITAL_COMMUNITY)
Admission: AD | Admit: 2022-05-30 | Discharge: 2022-06-04 | DRG: 885 | Disposition: A | Discharge status: Home / Self Care | Payer: BC Managed Care – PPO | Attending: Psychiatry | Admitting: Psychiatry

## 2022-05-30 DIAGNOSIS — F209 Schizophrenia, unspecified: Principal | ICD-10-CM | POA: Diagnosis present

## 2022-05-30 DIAGNOSIS — F411 Generalized anxiety disorder: Secondary | ICD-10-CM | POA: Diagnosis present

## 2022-05-30 DIAGNOSIS — G4700 Insomnia, unspecified: Secondary | ICD-10-CM | POA: Diagnosis present

## 2022-05-30 DIAGNOSIS — F319 Bipolar disorder, unspecified: Secondary | ICD-10-CM | POA: Diagnosis present

## 2022-05-30 DIAGNOSIS — Z79899 Other long term (current) drug therapy: Secondary | ICD-10-CM | POA: Diagnosis not present

## 2022-05-30 DIAGNOSIS — Z9151 Personal history of suicidal behavior: Secondary | ICD-10-CM

## 2022-05-30 DIAGNOSIS — F600 Paranoid personality disorder: Secondary | ICD-10-CM | POA: Diagnosis present

## 2022-05-30 DIAGNOSIS — F22 Delusional disorders: Secondary | ICD-10-CM | POA: Diagnosis present

## 2022-05-30 DIAGNOSIS — F1290 Cannabis use, unspecified, uncomplicated: Secondary | ICD-10-CM | POA: Insufficient documentation

## 2022-05-30 DIAGNOSIS — Z818 Family history of other mental and behavioral disorders: Secondary | ICD-10-CM | POA: Diagnosis not present

## 2022-05-30 DIAGNOSIS — Z1152 Encounter for screening for COVID-19: Secondary | ICD-10-CM | POA: Diagnosis not present

## 2022-05-30 DIAGNOSIS — Z560 Unemployment, unspecified: Secondary | ICD-10-CM | POA: Diagnosis not present

## 2022-05-30 DIAGNOSIS — Z716 Tobacco abuse counseling: Secondary | ICD-10-CM

## 2022-05-30 DIAGNOSIS — F203 Undifferentiated schizophrenia: Secondary | ICD-10-CM | POA: Diagnosis not present

## 2022-05-30 DIAGNOSIS — Z9049 Acquired absence of other specified parts of digestive tract: Secondary | ICD-10-CM | POA: Diagnosis not present

## 2022-05-30 DIAGNOSIS — F1210 Cannabis abuse, uncomplicated: Secondary | ICD-10-CM | POA: Diagnosis present

## 2022-05-30 DIAGNOSIS — F17290 Nicotine dependence, other tobacco product, uncomplicated: Secondary | ICD-10-CM | POA: Diagnosis present

## 2022-05-30 DIAGNOSIS — Z91148 Patient's other noncompliance with medication regimen for other reason: Secondary | ICD-10-CM

## 2022-05-30 DIAGNOSIS — Z888 Allergy status to other drugs, medicaments and biological substances status: Secondary | ICD-10-CM | POA: Diagnosis not present

## 2022-05-30 DIAGNOSIS — F17200 Nicotine dependence, unspecified, uncomplicated: Secondary | ICD-10-CM | POA: Insufficient documentation

## 2022-05-30 DIAGNOSIS — E8881 Metabolic syndrome: Secondary | ICD-10-CM | POA: Diagnosis present

## 2022-05-30 DIAGNOSIS — I10 Essential (primary) hypertension: Secondary | ICD-10-CM | POA: Diagnosis present

## 2022-05-30 LAB — SARS CORONAVIRUS 2 BY RT PCR: SARS Coronavirus 2 by RT PCR: NEGATIVE

## 2022-05-30 MED ORDER — ACETAMINOPHEN 325 MG PO TABS
650.0000 mg | ORAL_TABLET | Freq: Four times a day (QID) | ORAL | Status: DC | PRN
  Administered 2022-05-30 – 2022-06-03 (×7): 650 mg via ORAL
  Filled 2022-05-30 (×7): qty 2

## 2022-05-30 MED ORDER — LORAZEPAM 2 MG/ML IJ SOLN: 2.0000 mg | Freq: Three times a day (TID) | INTRAMUSCULAR | Status: DC | PRN

## 2022-05-30 MED ORDER — RISPERIDONE 1 MG PO TABS
1.0000 mg | ORAL_TABLET | Freq: Every day | ORAL | Status: DC
  Administered 2022-05-31: 1 mg via ORAL
  Filled 2022-05-30 (×3): qty 1

## 2022-05-30 MED ORDER — BENZTROPINE MESYLATE 0.5 MG PO TABS
0.5000 mg | ORAL_TABLET | Freq: Two times a day (BID) | ORAL | Status: DC
  Administered 2022-05-30 – 2022-06-04 (×10): 0.5 mg via ORAL
  Filled 2022-05-30 (×17): qty 1

## 2022-05-30 MED ORDER — LORAZEPAM 1 MG PO TABS: 2.0000 mg | ORAL_TABLET | Freq: Three times a day (TID) | ORAL | Status: DC | PRN

## 2022-05-30 MED ORDER — TRAZODONE HCL 50 MG PO TABS
50.0000 mg | ORAL_TABLET | Freq: Every evening | ORAL | Status: DC | PRN
  Administered 2022-05-31 – 2022-06-02 (×3): 50 mg via ORAL
  Filled 2022-05-30 (×4): qty 1

## 2022-05-30 MED ORDER — MAGNESIUM HYDROXIDE 400 MG/5ML PO SUSP: 30.0000 mL | Freq: Every day | ORAL | Status: DC | PRN

## 2022-05-30 MED ORDER — HYDROXYZINE HCL 50 MG PO TABS
50.0000 mg | ORAL_TABLET | Freq: Three times a day (TID) | ORAL | Status: DC | PRN
  Administered 2022-05-31 – 2022-06-04 (×5): 50 mg via ORAL
  Filled 2022-05-30 (×5): qty 1

## 2022-05-30 MED ORDER — NICOTINE 21 MG/24HR TD PT24
21.0000 mg | MEDICATED_PATCH | Freq: Once | TRANSDERMAL | Status: DC
  Administered 2022-05-30: 21 mg via TRANSDERMAL
  Filled 2022-05-30: qty 1

## 2022-05-30 MED ORDER — ALUM & MAG HYDROXIDE-SIMETH 200-200-20 MG/5ML PO SUSP: 30.0000 mL | ORAL | Status: DC | PRN

## 2022-05-30 NOTE — ED Notes (Signed)
Pt signed voluntary paperwork at this time.

## 2022-05-30 NOTE — Progress Notes (Signed)
Admission Note:   Patient arrived from [facility] via safe transport voluntarily. Reports that he got into an argument with his dad. His dad then called the police on him. Police brought him to the hospital. Patient has a history of schizophrenia. Reported by parents that patient has not been taking his medication for a weeks now. Patient also admitted that he has not been taking his medication. States the reason is because he forgets to take it. Patient denies SI/HI/AVH. Patient reports smoking 8 cigarettes per day. Reports vaping marijuana and drinking alcohol occasionally. Patient reports he lives with his parents but does not get along with his dad.

## 2022-05-30 NOTE — ED Notes (Addendum)
Pt to be accepted to [facility] and LCSW told this RN that pt needed to be swabbed and voluntary paperwork be faxed over. This RN told patient the plan of inpatient admission and that he would have to sign the voluntary paperwork in order to go. Pt stated that he "worked at [employer] and did not want to lose his job" and asked this RN to call his mother to inform her on this. This RN called the mother and mother stated that pt work was informed on this matter. Pt talking to mother via telephone at this time. No voluntary paperwork signed at this time.

## 2022-05-30 NOTE — Progress Notes (Addendum)
UPDATE: Per [facility] AC bed will be ready at 1500  Pt was accepted to [facility] 05/30/22; Bed Assignment 504-1 PENDING Discharges at [facility] -BED IS NOT READY  DX: Schizophrenia   Nursing has sent signed vol consent to [facility] AC.  Pt meets inpatient criteria per Ricky Ala, NP  Attending Physician will be Dr. Janine Limbo  Report can be called to: -Adult unit: [phone]  Pt can arrive after: BED IS NOT READY-[facility] AC will coordinate with care team.  Per [facility] please ensure original vol consent accompanies patient at transport when the time comes.   Care Team notified: [facility] Leonia Reader, RN, Meyer Russel, RN, Ricky Ala, NP, Adalberto Ill, RN, Meta Hatchet, RN, Celedonio Savage, RN  Nadara Mode, [place] 05/30/2022 @ 10:36 AM

## 2022-05-30 NOTE — Progress Notes (Signed)
   05/30/22 1645  Psych Admission Type (Psych Patients Only)  Admission Status Voluntary  Psychosocial Assessment  Patient Complaints Anxiety;Helplessness;Restlessness  Eye Contact Fair  Facial Expression Worried  Affect Appropriate to circumstance  Speech Logical/coherent  Interaction Assertive  Motor Activity Fidgety  Appearance/Hygiene In scrubs  Behavior Characteristics Cooperative;Appropriate to situation  Mood Anxious  Thought Process  Coherency Circumstantial  Content Blaming others;Blaming self  Delusions None reported or observed  Perception WDL  Hallucination None reported or observed  Judgment Impaired  Confusion WDL  Danger to Self  Current suicidal ideation? Denies  Danger to Others  Danger to Others Reported or observed  Danger to Others Abnormal  Harmful Behavior to others Threats of violence towards other people observed or expressed   Destructive Behavior No threats or harm toward property  Description of Harmful Behavior Altercation with father

## 2022-05-30 NOTE — ED Notes (Signed)
Report given to [facility] and stated pt can arrive after 3pm

## 2022-05-30 NOTE — BH Assessment (Signed)
Comprehensive Clinical Assessment (CCA) Note  05/30/2022 Gilbert Meyers [id number]  Disposition: Clinical report given to Evette Georges, NP who states Pt meet criteria for inpatient admission. RN Celedonio Savage and Dr. Dayna Barker informed of the recommendation.  The patient demonstrates the following risk factors for suicide: Chronic risk factors for suicide include: psychiatric disorder of schizophrenia . Acute risk factors for suicide include: family or marital conflict. Protective factors for this patient include: positive therapeutic relationship. Considering these factors, the overall suicide risk at this point appears to be none. Patient is not appropriate for outpatient follow up.  Gilbert Meyers is a 37 year-old single male who presents to [facility] ED via law enforcement, following an argument with his father. Per chart, Pt has a diagnosis of schizophrenia. Pt reports yesterday, he was having a difficult time and getting upset about the circumstances in his life, when his father called the police.  Pt would not elaborate with details or the reason his father felt the police needed to be contacted. Pt denies SI, HI, auditory or visual hallucinations. Pt reports he does vape and that he had a small amount of THC in his vape.  Pt denies additional substance use.  Pt identifies his primary stressor as his relationship with his father. Pt lives with his parents and works at [employer]. Pt identifies his parents and brother as his primary supports. Pt denies any history of abuse. Pt denies legal problems. Pt states he is currently receiving medication management with [facility]. He says he is prescribed Risperidone and takes it regularly. Per chart review, Pt hospitalized at [facility] 06/03/2017-06/10/2017 due to psychosis.  Pt is dressed in scrubs, alert and oriented x4 with normal speech. Pt has a flat affect and is guarded, providing vague answer to questions. Pt's thought process is coherent and relevant. There is no  indication patient is responding to internal stimuli. Pt is cooperative throughout the assessment.  Pt provided permission for his mother Lockwood Schnur [id number]) to be contacted for collateral. Pt's mother reports she woke patient up yesterday to tell him she was leaving the home, when Pt stated, "I'm not going to work today because they're going to kill me". She says she contacted Pt father, and he asked Pt to take his medication, which Pt declined. Pt's mother reports Pt has been off his medication for a while. She says they observed Pt screaming and running around in the yard, via there ring camera. Additionally, Pt's mother states Pt has been Water engineer bizarre post on Facebook. One post referenced stabbing someone with a sword. Pt's mother reports the family has reached out to [employer] as a resource for patient to get additional services.    Chief Complaint:  Chief Complaint  Patient presents with   Psychiatric Evaluation   Visit Diagnosis:     CCA Screening, Triage and Referral (STR)  Patient Reported Information How did you hear about [place]? Legal System  What Is the Reason for Your Visit/Call Today? Gilbert Meyers is a 37 year-old single male who presents voluntarily to [facility] ED via Event organiser. Per chart, Pt has a diagnosis of schizophrenia. Pt states he was having difficulty with his father yesterday, which led to his father contacting police. Pt states he vaped THC yesterday and denies additional substance use. Pt reports he takes risperidone, however his family contradict this, stating Pt is not taking his medications. Pt denies SI, HI, auditory or visual hallucinations.  How Long Has This Been Causing You Problems? 1 wk - 1  month  What Do You Feel Would Help You the Most Today? Treatment for Depression or other mood problem   Have You Recently Had Any Thoughts About Hurting Yourself? No  Are You Planning to Commit Suicide/Harm Yourself At This time? No   Flowsheet  Row ED from [facility]/29/2022 in [facility] Emergency Department at [facility] ED from 06/06/2021 in [facility] Emergency Department at [place] No Risk No Risk       Have you Recently Had Thoughts About [place]? No  Are You Planning to Harm Someone at This Time? No  Explanation: N/A   Have You Used Any Alcohol or Drugs in the Past 24 Hours? Yes  What Did You Use and How Much? Pt reports vaping THC   Do You Currently Have a Therapist/Psychiatrist? No  Name of Therapist/Psychiatrist: Name of Therapist/Psychiatrist: N/A   Have You Been Recently Discharged From Any Office Practice or Programs? No  Explanation of Discharge From Practice/Program: N/A     CCA Screening Triage Referral Assessment Type of Contact: Tele-Assessment  Telemedicine Service Delivery: Telemedicine service delivery: This service was provided via telemedicine using a 2-way, interactive audio and video technology  Is this Initial or Reassessment? Is this Initial or Reassessment?: Initial Assessment  Date Telepsych consult ordered in [facility]:  Date Telepsych consult ordered in [facility]: 05/30/22  Time Telepsych consult ordered in [facility]:  Time Telepsych consult ordered in [facility]: 0145  Location of Assessment: [facility] ED  Provider Location: [facility] Assessment Services   Collateral Involvement: Makoa Sarra (mom) [phone]   Does Patient Have a [place]? No  Legal Guardian Contact Information: N/A  Copy of Legal Guardianship Form: -- (N/A)  Legal Guardian Notified of Arrival: -- (N/A)  Legal Guardian Notified of Pending Discharge: -- (N/A)  If Minor and Not Living with Parent(s), Who has Custody? N/A  Is CPS involved or ever been involved? Never  Is APS involved or ever been involved? Never   Patient Determined To Be At Risk for Harm To Self or Others Based on Review of Patient Reported Information or Presenting Complaint? No (denies  SI/HI)  Method: No Plan (denies SI/HI)  Availability of Means: No access or NA (denies SI/HI)  Intent: Vague intent or NA (denies SI/HI)  Notification Required: No need or identified person (denies SI/HI)  Additional Information for Danger to Others Potential: -- (denies SI/HI)  Additional Comments for Danger to Others Potential: N/A  Are There Guns or Other Weapons in Your Home? No  Types of Guns/Weapons: N/A  Are These Weapons Safely Secured?                            -- (N/A)  Who Could Verify You Are Able To Have These Secured: N/A  Do You Have any Outstanding Charges, Pending Court Dates, Parole/Probation? Pt denies  Contacted To Inform of Risk of Harm To Self or Others: -- (N/A)    Does Patient Present under Involuntary Commitment? No    [place] of Residence: [place]   Patient Currently Receiving the Following Services: Not Receiving Services   Determination of Need: Emergent (2 hours)   Options For Referral: Inpatient Hospitalization; Medication Management     CCA Biopsychosocial Patient Reported Schizophrenia/Schizoaffective Diagnosis in Past: Yes   Strengths: Pt is employed, which he enjoys.   Mental Health Symptoms Depression:   None   Duration of Depressive symptoms:  Mania:   None   Anxiety:    Worrying   Psychosis:  No data recorded  Duration of Psychotic symptoms:    Trauma:   None   Obsessions:   None   Compulsions:   None   Inattention:   None   Hyperactivity/Impulsivity:   None   Oppositional/Defiant Behaviors:   None   Emotional Irregularity:   N/A   Other Mood/Personality Symptoms:   N/A    Mental Status Exam Appearance and self-care  Stature:   Average   Weight:   Average weight   Clothing:   -- (hosptial scrubs)   Grooming:   Normal   Cosmetic use:   None   Posture/gait:   Rigid   Motor activity:   Not Remarkable   Sensorium  Attention:   Normal   Concentration:   Normal    Orientation:   X5   Recall/memory:   Normal   Affect and Mood  Affect:   Anxious   Mood:   Angry   Relating  Eye contact:   Normal   Facial expression:   Responsive   Attitude toward examiner:   Cooperative   Thought and Language  Speech flow:  Normal   Thought content:   Appropriate to Mood and Circumstances   Preoccupation:   None   Hallucinations:   None   Organization:   Coherent   [employer] of Knowledge:   Average   Intelligence:   Average   Abstraction:   Normal   Judgement:   Normal   Reality Testing:   Adequate   Insight:   Lacking   Decision Making:   Impulsive   Social Functioning  Social Maturity:   Isolates   Social Judgement:   Normal   Stress  Stressors:   Family conflict   Coping Ability:   Normal   Skill Deficits:   None   Supports:   Family     Religion: Religion/Spirituality Are You A Religious Person?: Yes What is Your Religious Affiliation?: International aid/development worker: Leisure / Recreation Do You Have Hobbies?: Yes Leisure and Hobbies: Golf  Exercise/Diet: Exercise/Diet Do You Exercise?: Yes How Many Times a Week Do You Exercise?: 1-3 times a week Have You Gained or Lost A Significant Amount of Weight in the Past Six Months?: No Do You Follow a Special Diet?: No Do You Have Any Trouble Sleeping?: No   CCA Employment/Education Employment/Work Situation: Employment / Work Situation Employment Situation: Employed Work Stressors: None identified Patient's Job has Been Impacted by Current Illness: No Has Patient ever Been in Passenger transport manager?: No  Education: Education Is Patient Currently Attending School?: No Last Grade Completed: 12 Did You Nutritional therapist?: Yes What Type of College Degree Do you Have?: Masters of science Did You Have An Individualized Education Program (IIEP): No Did You Have Any Difficulty At [employer]?: No Patient's Education Has Been Impacted by  Current Illness: No   CCA Family/Childhood History Family and Relationship History: Family history Marital status: Single Does patient have children?: No  Childhood History:  Childhood History By whom was/is the patient raised?: Both parents Did patient suffer any verbal/emotional/physical/sexual abuse as a child?: No Did patient suffer from severe childhood neglect?: No Has patient ever been sexually abused/assaulted/raped as an adolescent or adult?: No Was the patient ever a victim of a crime or a disaster?: No Witnessed domestic violence?: No Has patient been affected by domestic violence as an adult?: No Description of domestic violence: N/A  CCA Substance Use Alcohol/Drug Use: Alcohol / Drug Use Pain Medications: See MAR Prescriptions: See MAR Over the Counter: See MAR History of alcohol / drug use?: No history of alcohol / drug abuse Longest period of sobriety (when/how long): N/A Withdrawal Symptoms:  (N/A)                         ASAM's:  Six Dimensions of Multidimensional Assessment  Dimension 1:  Acute Intoxication and/or Withdrawal Potential:      Dimension 2:  Biomedical Conditions and Complications:      Dimension 3:  Emotional, Behavioral, or Cognitive Conditions and Complications:     Dimension 4:  Readiness to Change:     Dimension 5:  Relapse, Continued use, or Continued Problem Potential:     Dimension 6:  Recovery/Living Environment:     ASAM Severity Score:    ASAM Recommended Level of Treatment:     Substance use Disorder (SUD)    Recommendations for Services/Supports/Treatments:    Discharge Disposition:    DSM5 Diagnoses: Patient Active Problem List   Diagnosis Date Noted   Acute cholecystitis 02/01/2019   Schizophrenia ([place]) 06/03/2017   Hypertension 02/19/2017   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

## 2022-05-30 NOTE — ED Notes (Signed)
Paperwork attempted to be faxed to the [place] with the number provided with no success. This RN attempted to call [place] with no answer. Message sent to LCSW with no response.

## 2022-05-30 NOTE — Group Note (Signed)
Date:  05/30/2022 Time:  9:33 PM  Group Topic/Focus:  Wrap-Up Group:   The focus of this group is to help patients review their daily goal of treatment and discuss progress on daily workbooks.    Participation Level:  Active  Participation Quality:  Appropriate  Affect:  Appropriate  Cognitive:  Appropriate  Insight: Appropriate  Engagement in Group:  Engaged  Modes of Intervention:  Education  Additional Comments:  Patient attended and participated in group tonight. He reports that he came in the hospital today and the best thing was he received a room.  Salley Scarlet [facility] 05/30/2022, 9:33 PM

## 2022-05-30 NOTE — Consult Note (Signed)
Telepsych Consultation   Reason for Consult: "Threatening to kill others online per father" Referring Physician: Fatima Sanger PA Location of Patient: [place] [facility] [id number] Location of Provider: Other: [facility] urgent care  Patient Identification: Gilbert Meyers MRN:  [id number] Principal Diagnosis: Schizophrenia ([place]) Diagnosis:  Principal Problem:   Schizophrenia ([place])   Total Time spent with patient: 15 minutes  Subjective:  Gilbert Meyers was seen and evaluated via teleassessment.  He continues to deny suicidal or homicidal ideation.  Reported that he was in a verbal altercation between he and his dad.  States he is unable to recall the reason for the argument.  Reports he is currently followed by [facility] for medication management.  Denies that he has been medication compliant.  States he is supposed to be prescribed respite all however takes medications sporadically.  States his last outpatient visit was 2 months ago.  He reports mild paranoia. "  Only sometimes when I vape."  Patient presents very minimally throughout this assessment.  Patient was initially recommended for inpatient admission.    Per charted collateral "Pt provided permission for his mother Gilbert Meyers [phone]) to be contacted for collateral. Pt's mother reports she woke patient up yesterday to tell him she was leaving the home, when Pt stated, "I'm not going to work today because they're going to kill me". She says she contacted Pt father, and he asked Pt to take his medication, which Pt declined. Pt's mother reports Pt has been off his medication for a while. She says they observed Pt screaming and running around in the yard, via there ring camera. Additionally, Pt's mother states Pt has been Water engineer bizarre post on Facebook. One post referenced stabbing someone with a sword. Pt's mother reports the family has reached out to [employer] as a resource for patient to get additional services."  HPI: Per initial admission  assessment note: "Gilbert Meyers is a 37 year-old single male who presents to [facility] ED via Event organiser, following an argument with his father. Per chart, Pt has a diagnosis of schizophrenia. Pt reports yesterday, he was having a difficult time and getting upset about the circumstances in his life, when his father called the police.  Pt would not elaborate with details or the reason his father felt the police needed to be contacted. Pt denies SI, HI, auditory or visual hallucinations. Pt reports he does vape and that he had a small amount of THC in his vape.  Pt denies additional substance use.  Pt identifies his primary stressor as his relationship with his father. Pt lives with his parents and works at [employer]. Pt identifies his parents and brother as his primary supports. Pt denies any history of abuse. Pt denies legal problems."   Past Psychiatric History: Schizophrenia.  Currently prescribed Resporal 1 mg nightly.  States he is followed by [facility] for medication management.  Previous inpatient admissions  Risk to Self:   Risk to Others:   Prior Inpatient Therapy:   Prior Outpatient Therapy:   Work 2 Past Medical History:  Past Medical History:  Diagnosis Date   Anxiety    Bipolar 1 disorder ([place])    Schizophrenia ([place])     Past Surgical History:  Procedure Laterality Date   CHOLECYSTECTOMY N/A 02/02/2019   Procedure: LAPAROSCOPIC CHOLECYSTECTOMY;  Surgeon: Virl Cagey, MD;  Location: [facility];  Service: General;  Laterality: N/A;   Family History:  Family History  Problem Relation Age of Onset   Prostate cancer Paternal Grandfather  Colon cancer Neg Hx    Family Psychiatric  History:  Social History:  Social History   Substance and Sexual Activity  Alcohol Use No   Comment: former     Social History   Substance and Sexual Activity  Drug Use Yes   Types: Marijuana    Social History   Socioeconomic History   Marital status: Single    Spouse name: Not on  file   Number of children: 0   Years of education: Not on file   Highest education level: Not on file  Occupational History   Occupation: Unemployed   Tobacco Use   Smoking status: Former    Packs/day: .1    Types: E-cigarettes, Cigarettes   Smokeless tobacco: Never  Vaping Use   Vaping Use: Former  Substance and Sexual Activity   Alcohol use: No    Comment: former   Drug use: Yes    Types: Marijuana   Sexual activity: Not Currently    Birth control/protection: Condom  Other Topics Concern   Not on file  Social History Narrative   Not on file   Social Determinants of Health   Financial Resource Strain: Not on file  Food Insecurity: Not on file  Transportation Needs: Not on file  Physical Activity: Not on file  Stress: Not on file  Social Connections: Not on file   Additional Social History:    Allergies:   Allergies  Allergen Reactions   Cymbalta [Duloxetine Hcl]     "makes him feel awful"    Labs:  Results for orders placed or performed during the hospital encounter of 05/29/22 (from the past 48 hour(s))  Comprehensive metabolic panel     Status: Abnormal   Collection Time: 05/29/22  3:54 PM  Result Value Ref Range   Sodium 137 135 - 145 mmol/L   Potassium 3.7 3.5 - 5.1 mmol/L   Chloride 105 98 - 111 mmol/L   CO2 22 22 - 32 mmol/L   Glucose, Bld 114 (H) 70 - 99 mg/dL    Comment: Glucose reference range applies only to samples taken after fasting for at least 8 hours.   BUN 12 6 - 20 mg/dL   Creatinine, Ser 0.84 0.61 - 1.24 mg/dL   Calcium 8.7 (L) 8.9 - 10.3 mg/dL   Total Protein 6.6 6.5 - 8.1 g/dL   Albumin 4.2 3.5 - 5.0 g/dL   AST 22 15 - 41 U/L   ALT 18 0 - 44 U/L   Alkaline Phosphatase 57 38 - 126 U/L   Total Bilirubin 1.2 0.3 - 1.2 mg/dL   GFR, Estimated >60 >60 mL/min    Comment: (NOTE) Calculated using the CKD-EPI Creatinine Equation (2021)    Anion gap 10 5 - 15    Comment: Performed at [facility], [address] [place], [place]  [address]  CBC with Differential     Status: None   Collection Time: 05/29/22  3:54 PM  Result Value Ref Range   WBC 6.7 4.0 - 10.5 K/uL   RBC 5.12 4.22 - 5.81 MIL/uL   Hemoglobin 15.5 13.0 - 17.0 g/dL   HCT 46.0 39.0 - 52.0 %   MCV 89.8 80.0 - 100.0 fL   MCH 30.3 26.0 - 34.0 pg   MCHC 33.7 30.0 - 36.0 g/dL   RDW 12.6 11.5 - 15.5 %   Platelets 263 150 - 400 K/uL   nRBC 0.0 0.0 - 0.2 %   Neutrophils Relative % 66 %  Neutro Abs 4.3 1.7 - 7.7 K/uL   Lymphocytes Relative 26 %   Lymphs Abs 1.8 0.7 - 4.0 K/uL   Monocytes Relative 6 %   Monocytes Absolute 0.4 0.1 - 1.0 K/uL   Eosinophils Relative 1 %   Eosinophils Absolute 0.1 0.0 - 0.5 K/uL   Basophils Relative 1 %   Basophils Absolute 0.1 0.0 - 0.1 K/uL   Immature Granulocytes 0 %   Abs Immature Granulocytes 0.02 0.00 - 0.07 K/uL    Comment: Performed at [facility], [address], [place], [place] [address]  Ethanol     Status: None   Collection Time: 05/29/22  3:54 PM  Result Value Ref Range   Alcohol, Ethyl (B) <10 <10 mg/dL    Comment: (NOTE) Lowest detectable limit for serum alcohol is 10 mg/dL.  For medical purposes only. Performed at [facility], [address], [place], [place] [address]   Urinalysis, w/ Reflex to Culture (Infection Suspected) -Urine, Clean Catch     Status: Abnormal   Collection Time: 05/29/22  4:24 PM  Result Value Ref Range   Specimen Source URINE, CLEAN CATCH    Color, Urine YELLOW YELLOW   APPearance HAZY (A) CLEAR   Specific Gravity, Urine 1.023 1.005 - 1.030   pH 6.0 5.0 - 8.0   Glucose, UA NEGATIVE NEGATIVE mg/dL   Hgb urine dipstick NEGATIVE NEGATIVE   Bilirubin Urine NEGATIVE NEGATIVE   Ketones, ur NEGATIVE NEGATIVE mg/dL   Protein, ur NEGATIVE NEGATIVE mg/dL   Nitrite NEGATIVE NEGATIVE   Leukocytes,Ua NEGATIVE NEGATIVE   RBC / HPF 0-5 0 - 5 RBC/hpf   WBC, UA 0-5 0 - 5 WBC/hpf    Comment:        Reflex urine culture not performed if WBC <=10, OR if Squamous epithelial cells >5. If  Squamous epithelial cells >5 suggest recollection.    Bacteria, UA NONE SEEN NONE SEEN   Squamous Epithelial / HPF 0-5 0 - 5 /HPF   Mucus PRESENT     Comment: Performed at [facility], [address] [place], [place] [address]  Rapid urine drug screen (hospital performed)     Status: Abnormal   Collection Time: 05/29/22  4:24 PM  Result Value Ref Range   Opiates NONE DETECTED NONE DETECTED   Cocaine NONE DETECTED NONE DETECTED   Benzodiazepines NONE DETECTED NONE DETECTED   Amphetamines NONE DETECTED NONE DETECTED   Tetrahydrocannabinol POSITIVE (A) NONE DETECTED   Barbiturates NONE DETECTED NONE DETECTED    Comment: (NOTE) DRUG SCREEN FOR MEDICAL PURPOSES ONLY.  IF CONFIRMATION IS NEEDED FOR ANY PURPOSE, NOTIFY LAB WITHIN 5 DAYS.  LOWEST DETECTABLE LIMITS FOR URINE DRUG SCREEN Drug Class                     Cutoff (ng/mL) Amphetamine and metabolites    1000 Barbiturate and metabolites    200 Benzodiazepine                 200 Opiates and metabolites        300 Cocaine and metabolites        300 THC                            50 Performed at [facility], [address], [place], [place] [address]   SARS Coronavirus 2 by RT PCR (hospital order, performed in [facility] hospital lab) *cepheid single result test* Anterior Nasal  Swab     Status: None   Collection Time: 05/30/22  2:38 AM   Specimen: Anterior Nasal Swab  Result Value Ref Range   SARS Coronavirus 2 by RT PCR NEGATIVE NEGATIVE    Comment: (NOTE) SARS-CoV-2 target nucleic acids are NOT DETECTED.  The SARS-CoV-2 RNA is generally detectable in upper and lower respiratory specimens during the acute phase of infection. The lowest concentration of SARS-CoV-2 viral copies this assay can detect is 250 copies / mL. A negative result does not preclude SARS-CoV-2 infection and should not be used as the sole basis for treatment or other patient management decisions.  A negative result may occur with improper specimen  collection / handling, submission of specimen other than nasopharyngeal swab, presence of viral mutation(s) within the areas targeted by this assay, and inadequate number of viral copies (<250 copies / mL). A negative result must be combined with clinical observations, patient history, and epidemiological information.  Fact Sheet for Patients:   https://www.patel.info/  Fact Sheet for Healthcare Providers: https://hall.com/  This test is not yet approved or  cleared by the [place] FDA and has been authorized for detection and/or diagnosis of SARS-CoV-2 by FDA under an Emergency Use Authorization (EUA).  This EUA will remain in effect (meaning this test can be used) for the duration of the COVID-19 declaration under Section 564(b)(1) of the Act, 21 U.S.C. section 360bbb-3(b)(1), unless the authorization is terminated or revoked sooner.  Performed at [facility], [address] [place], [place] [address]     Medications:  Current Facility-Administered Medications  Medication Dose Route Frequency Provider Last Rate Last Admin   benztropine (COGENTIN) tablet 0.5 mg  0.5 mg Oral BID Small, Brooke L, PA   0.5 mg at 05/30/22 0940   hydrOXYzine (ATARAX) tablet 50 mg  50 mg Oral TID PRN Small, Brooke L, PA       ibuprofen (ADVIL) tablet 800 mg  800 mg Oral TID Small, Brooke L, PA   800 mg at 05/30/22 0940   nicotine (NICODERM CQ - dosed in mg/24 hours) patch 21 mg  21 mg Transdermal Once Mesner, Jason, MD   21 mg at 05/30/22 0259   risperiDONE (RISPERDAL) tablet 1 mg  1 mg Oral Daily Reome, Earle J, RPH   1 mg at 05/30/22 0941   risperiDONE (RISPERDAL) tablet 1-2 mg  1-2 mg Oral QHS Reome, Earle J, RPH   1 mg at 05/29/22 2109   Current Outpatient Medications  Medication Sig Dispense Refill   hydrOXYzine (ATARAX/VISTARIL) 50 MG tablet Take 50 mg by mouth 3 (three) times daily as needed for anxiety.     ibuprofen (ADVIL) 800 MG tablet Take 1  tablet (800 mg total) by mouth 3 (three) times daily. 21 tablet 0   risperiDONE (RISPERDAL) 1 MG tablet Take 1 mg by mouth See admin instructions. 1 tablet in the morning and 1-2 every evening     benztropine (COGENTIN) 0.5 MG tablet Take 1 tablet (0.5 mg total) by mouth 2 (two) times daily. For prevention of drug induced tremors (Patient not taking: Reported on 05/29/2022) 60 tablet 0    Musculoskeletal:   Psychiatric Specialty Exam:  Presentation  General Appearance: Appropriate for Environment  Eye Contact:Good  Speech:Clear and Coherent  Speech Volume:Normal  Handedness:Right   Mood and Affect  Mood:Depressed  Affect:Congruent   Thought Process  Thought Processes:Coherent  Descriptions of Associations:Intact  Orientation:Full (Time, Place and Person)  Thought Content:Logical; Paranoid Ideation  History of Schizophrenia/Schizoaffective disorder:Yes  Duration of Psychotic Symptoms:No data recorded Hallucinations:Hallucinations: None  Ideas of Reference:None  Suicidal Thoughts:Suicidal Thoughts: No  Homicidal Thoughts:Homicidal Thoughts: No   Sensorium  Memory:Immediate Fair; Remote Fair  Judgment:Fair  Insight:Fair   Executive Functions  Concentration:Fair  Attention Span:Good  [place] of Knowledge:Good  Language:Good   Psychomotor Activity  Psychomotor Activity:Psychomotor Activity: Normal   Assets  Assets:No data recorded  Sleep  Sleep:Sleep: Fair    Physical Exam: Physical Exam Vitals and nursing note reviewed.  Neurological:     Mental Status: He is alert and oriented to person, place, and time.  Psychiatric:        Mood and Affect: Mood normal.        Thought Content: Thought content normal.    Review of Systems  Psychiatric/Behavioral:  Positive for depression. Negative for hallucinations, substance abuse and suicidal ideas. The patient is not nervous/anxious.   All other systems reviewed and are  negative.  Blood pressure 100/87, pulse 88, temperature 97.7 F (36.5 C), temperature source Oral, resp. rate 16, height 5\' 10"  (1.778 m), weight 82.1 kg, SpO2 97 %. Body mass index is 25.97 kg/m.    Disposition: Recommend psychiatric Inpatient admission when medically cleared. Patient accepted to [facility]  This service was provided via telemedicine using a 2-way, interactive audio and video technology.  Names of all persons participating in this telemedicine service and their role in this encounter. Name: Gilbert Meyers  Role: patient   Name: T.Harshitha Fretz  Role: NP           Derrill [place], NP 05/30/2022 10:37 AM

## 2022-05-30 NOTE — BH Assessment (Signed)
Patient provided permission for his mother Svanik Geiser [phone] to be contacted for collateral. Pt's mother requested a Social Worker reach out to assist the family with additional resources.   Darra Lis, MSW, LCSW Triage Specialist

## 2022-05-30 NOTE — Progress Notes (Signed)
   05/30/22 2000  Psych Admission Type (Psych Patients Only)  Admission Status Voluntary  Psychosocial Assessment  Patient Complaints Restlessness;Worrying  Eye Contact Fair  Facial Expression Worried  Affect Appropriate to circumstance  Catering manager Activity Fidgety  Appearance/Hygiene In scrubs  Behavior Characteristics Cooperative;Appropriate to situation;Fidgety  Mood Anxious;Pleasant  Thought Process  Coherency Circumstantial  Content Blaming others  Delusions None reported or observed  Perception WDL  Hallucination None reported or observed  Judgment Impaired  Confusion None  Danger to Self  Current suicidal ideation? Denies  Danger to Others  Danger to Others Reported or observed (see admission notes)  Danger to Others Abnormal  Harmful Behavior to others Threats of violence towards other people observed or expressed  (see admission notes)  Destructive Behavior No threats or harm toward property

## 2022-05-30 NOTE — ED Notes (Signed)
The family of this pt came to retreive his belongings, his parents took the whole belongings bag with them.  Pt is aware

## 2022-05-30 NOTE — ED Notes (Signed)
Pt being evaluated by TTS at this time

## 2022-05-31 DIAGNOSIS — F209 Schizophrenia, unspecified: Secondary | ICD-10-CM | POA: Diagnosis not present

## 2022-05-31 DIAGNOSIS — F17200 Nicotine dependence, unspecified, uncomplicated: Secondary | ICD-10-CM | POA: Insufficient documentation

## 2022-05-31 DIAGNOSIS — F1290 Cannabis use, unspecified, uncomplicated: Secondary | ICD-10-CM | POA: Insufficient documentation

## 2022-05-31 MED ORDER — LORAZEPAM 1 MG PO TABS: 2.0000 mg | ORAL_TABLET | Freq: Three times a day (TID) | ORAL | Status: DC | PRN

## 2022-05-31 MED ORDER — LORAZEPAM 2 MG/ML IJ SOLN: 2.0000 mg | Freq: Three times a day (TID) | INTRAMUSCULAR | Status: DC | PRN

## 2022-05-31 MED ORDER — HALOPERIDOL LACTATE 5 MG/ML IJ SOLN: 5.0000 mg | Freq: Three times a day (TID) | INTRAMUSCULAR | Status: DC | PRN

## 2022-05-31 MED ORDER — DIPHENHYDRAMINE HCL 50 MG/ML IJ SOLN: 50.0000 mg | Freq: Three times a day (TID) | INTRAMUSCULAR | Status: DC | PRN

## 2022-05-31 MED ORDER — RISPERIDONE 1 MG PO TABS
1.0000 mg | ORAL_TABLET | Freq: Two times a day (BID) | ORAL | Status: DC
  Administered 2022-05-31 – 2022-06-04 (×8): 1 mg via ORAL
  Filled 2022-05-31 (×14): qty 1

## 2022-05-31 MED ORDER — DIPHENHYDRAMINE HCL 25 MG PO CAPS: 50.0000 mg | ORAL_CAPSULE | Freq: Three times a day (TID) | ORAL | Status: DC | PRN

## 2022-05-31 MED ORDER — NICOTINE 21 MG/24HR TD PT24
21.0000 mg | MEDICATED_PATCH | Freq: Every day | TRANSDERMAL | Status: DC
  Administered 2022-05-31 – 2022-06-04 (×5): 21 mg via TRANSDERMAL
  Filled 2022-05-31 (×10): qty 1

## 2022-05-31 MED ORDER — NICOTINE 21 MG/24HR TD PT24
21.0000 mg | MEDICATED_PATCH | Freq: Every day | TRANSDERMAL | Status: DC
  Filled 2022-05-31 (×2): qty 1

## 2022-05-31 MED ORDER — HALOPERIDOL 5 MG PO TABS: 5.0000 mg | ORAL_TABLET | Freq: Three times a day (TID) | ORAL | Status: DC | PRN

## 2022-05-31 NOTE — Group Note (Signed)
Date:  05/31/2022 Time:  6:39 PM  Group Topic/Focus:  Wellness Toolbox:   The focus of this group is to discuss various aspects of wellness, balancing those aspects and exploring ways to increase the ability to experience wellness.  Patients will create a wellness toolbox for use upon discharge.    Participation Level:  Did Not Attend  Participation Quality:    Affect:    Cognitive:    Insight:   Engagement in Group:   Modes of Intervention:    Additional Comments:    Garvin Fila 05/31/2022, 6:39 PM

## 2022-05-31 NOTE — Progress Notes (Addendum)
Pt A & O X3 with brief eye contact, linear speech, minimal and guarded with blank affect on interactions. Denies SI, HI, AVH and pain when assessed. Confirms he slept well last night with good appetite. PRN Vistaril given for anxiety 6/10 with desired effect when reassessed at 0855. Compliant with medications when offered, denies adverse drug reactions. Safety checks maintained at Q 15 minutes intervals without outburst. Tolerates meals and fluids well. Attended some groups, engaged minimally in activities but was pleasant and redirectable. Safety maintained on and off unit.

## 2022-05-31 NOTE — Group Note (Signed)
Recreation Therapy Group Note   Group Topic:Goal Setting  Group Date: 05/31/2022 Start Time: 1000 End Time: 1035 Facilitators: Affie Gasner-McCall, LRT,CTRS Location: [address] Dayroom   Goal Area(s) Addresses:  Patient will successfully identify goals they was to achieve. Patient will discuss what prevents them from reaching their goals.   Group Description: DREAM. Patients were given a worksheet with dream spelled in large block letters.  Patients were to write their goals inside inside of the letters that spell out dream.  Patients were to then write the things that prevent them from reaching their goals outside of the letters.  Patients were provided markers to complete the activity.   Affect/Mood: N/A   Participation Level: Did not attend    Clinical Observations/Individualized Feedback:     Plan: Continue to engage patient in RT group sessions 2-3x/week.   Celsey Asselin-McCall, LRT,CTRS  05/31/2022 12:36 PM

## 2022-05-31 NOTE — Progress Notes (Signed)
   05/31/22 0600  15 Minute Checks  Location Bedroom  Visual Appearance Calm  Behavior Sleeping  Sleep (Behavioral Health Patients Only)  Calculate sleep? (Click Yes once per 24 hr at 0600 safety check) Yes  Documented sleep last 24 hours 7.25

## 2022-05-31 NOTE — Group Note (Signed)
Occupational Therapy Group Note  Group Topic:Coping Skills  Group Date: 05/31/2022 Start Time: 1400 End Time: 30 Facilitators: Brantley Stage, OT   Group Description: Group encouraged increased engagement and participation through discussion and activity focused on "Coping Ahead." Patients were split up into teams and selected a card from a stack of positive coping strategies. Patients were instructed to act out/charade the coping skill for other peers to guess and receive points for their team. Discussion followed with a focus on identifying additional positive coping strategies and patients shared how they were going to cope ahead over the weekend while continuing hospitalization stay.  Therapeutic Goal(s): Identify positive vs negative coping strategies. Identify coping skills to be used during hospitalization vs coping skills outside of hospital/at home Increase participation in therapeutic group environment and promote engagement in treatment   Participation Level: Engaged   Participation Quality: Minimal Cues   Behavior: Appropriate   Speech/Thought Process: Coherent   Affect/Mood: Flat   Insight: Fair   Judgement: Fair   Individualization: pt was engaged in their participation of group discussion/activity. New skills were identified  Modes of Intervention: Education  Patient Response to Interventions:  Attentive   Plan: Continue to engage patient in OT groups 2 - 3x/week.  05/31/2022  Brantley Stage, OT [name], OT

## 2022-05-31 NOTE — [facility] Suicide Risk Assessment (Signed)
Suicide Risk Assessment  Admission Assessment    [facility] Admission Suicide Risk Assessment   Nursing information obtained from:  Patient Demographic factors:  Male, Caucasian Current Mental Status:  NA Loss Factors:  Financial problems / change in socioeconomic status Historical Factors:  NA Risk Reduction Factors:  Living with another person, especially a relative, Positive social support  Total Time spent with patient: 45 minutes Principal Problem: Schizophrenia ([place]) Diagnosis:  Principal Problem:   Schizophrenia ([place])   Subjective Data:   CC: "I just got upset"   Gilbert Meyers is a 37 yo male with a PPHx of schizophrenia,GAD, and cannabis use disorder with 4 prior inpatient hospitalizations, presenting voluntarily to [facility] for worsening agitation, paranoia, and psychosis in the setting of medication nonadherence and cannabis use.  Admitted on 05/30/2022.   PRN medication prior to evaluation:  Tylenol 1X Atarax 1X     Collateral Information: Mother,  Gilbert Meyers at [phone]: Reports the patient began exhibiting paranoia and bizarre behavior on Tuesday (05/29/2022) morning, told mom he was not going to go to work that day because people were out to kill him.  Mother discovered that patient had been posting violent and graphic post about killing people on Facebook.  Mother had to leave for an appointment, witnessed from the house camera that the patient was pacing around the house, screaming.  [facility] was contacted and assisted in transporting patient to [facility] ED.   Reports patient has not been himself since December, they have a lot of trouble keeping the patient compliant on his psychiatric medications.  Patient reports that he does not need the medication, complains of sedation.  Mother reports that patient is much calmer on Risperdal, has not appreciated any adverse side effects.  Also reports the patient has poor sleep hygiene, often staying awake throughout the night.  Reports  the patient will often vape 2 pens at a time.  They were working on getting the patient set up with an Materials engineer ACTT team.  They report there are no weapons in the house, patient has no history of violence, homicidal ideation, or suicidal ideation.     HPI:  Patient evaluated at bedside, reports that Tuesday morning (05/29/2022), he got into an argument with his parents.  Describes feeling frustrated with how his life is going, feels that he is not making enough money working at [employer], and feels frustrated that he is still living with his parents at his age.  He also admits to missing several doses of his Risperdal prior to this altercation.  Patient vaguely alludes to paranoid ideations at this time, clarified with collateral call above.  Denies any threats of violence, reports "I just got upset, it was a misunderstanding".  On assessment, patient denies any auditory or visual hallucinations.  He denies thought insertion, thought withdrawal, and ideas of reference.  Denies any paranoid ideations presently.  He does endorse a history of paranoia, reports that he feels worried about being targeted while he is at work.  He often drives home feeling like he is being followed.  Otherwise denies any other perceptual disturbances.  He reports he smokes delta 8/delta 9 synthetic cannabis on a daily basis, does not believe his cannabis use contributes to his psychosis/paranoia in any way.   Regarding his history of medication noncompliance, patient reports that he has a hard time taking his medications because of his busy schedule.  Discussed option for LAI, states "I rather just keep taking my pills".  Per collateral call  with mom, patient has made complaints in the past about feeling sedated on Risperdal.   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  Explains that he is able to do this  because of a friend that changed baseball teams.  Believes this hypermobility may kill him in the future.   Patient denies suicidal ideation.  Denies any history of suicide attempts or suicidal ideation   On psychiatric review of systems, patient denies any symptoms indicative of depression, when assessing for guilt, anhedonia, diminished concentration, changes in appetite or sleep, he simply states "I do not know".  Answers same when screening for history of mania.  Regarding his diagnosis of GAD, he reports "I worry about some things".  Denies any difficulty controlling his worries.   Past Psychiatric Hx: Current Psychiatrist: DR. Hoyle Barr at [facility], last visit 1 month ago Current Therapist: Denies Previous Psych Diagnoses: Schizophrenia, GAD Current psychiatric medications: Risperdal 1 mg every 12 hours, Cogentin 0.5 mg twice daily Psychiatric medication history: Trazodone-reports currently not taking for sleep Cymbalta-reports it has made him "violently suicidal" in the past Seroquel-reports it is too sedating Prior inpatient treatment: 4 prior hospitalizations Found on chart review: 03/24/2015-03/29/2015 (5 days) at [facility], [facility] Admitted for worsening psychosis in the context of medication noncompliance Started on Risperdal with plan to bridge to LAI 06/03/2017- 06/10/2017 (7 days) at [facility] "Worsening symptoms of psychosis including AH, poor intake of food and water for the past 5 weeks, anxiety, and somatic delusions regarding his jaw, throat and arm. " "3 previous inpatient stays with last being in 2017 to [facility]" History of suicide: as described in the HPI History of homicide or aggression: as described in the HPI Psychiatric medication compliance history: As described in HPI     Substance Abuse Hx: Alcohol: Reports he drinks 3-4 times a year Tobacco: Daily vape use Cannabis: Reports smoking delta 8/delta 9 synthetic cannabis daily for over 10 years.   Past Medical  History: PCP: Denies Diagnoses on chart review: Metabolic syndrome, dysphagia, cholecystitis, epididymitis 05/29/2021, HTN Surg: Lap chole 02/02/2019 ALL: Denies Trauma: Denies Seizures: Denies   Family History: Paternal grandfather- prostate cancer "3 maternal cousins have bipolar disorder, maternal cousin has history of overdose of methamphetamine, and maternal grandmother has history of unknown mental illness. "   Social History: Lives in [place] with mother, father and paternal grandfather. Education: Completed bachelor's in sports communications Work: Working at [employer] for the past 3 years Marital Status: Single Children: Denies Abuse: None except as detailed in HPI Legal: Denies Nature conservation officer: Denies  Continued Clinical Symptoms:  Alcohol Use Disorder Identification Test Final Score (AUDIT): 3 The "Alcohol Use Disorders Identification Test", Guidelines for Use in Primary Care, Second Edition.  World Pharmacologist [facility]). Score between 0-7:  no or low risk or alcohol related problems. Score between 8-15:  moderate risk of alcohol related problems. Score between 16-19:  high risk of alcohol related problems. Score 20 or above:  warrants further diagnostic evaluation for alcohol dependence and treatment.   CLINICAL FACTORS:   Alcohol/Substance Abuse/Dependencies Schizophrenia:   Less than 36 years old More than one psychiatric diagnosis Unstable or Poor Therapeutic Relationship Previous Psychiatric Diagnoses and Treatments   Musculoskeletal: Strength & Muscle Tone: within normal limits Gait & Station: normal Patient leans: N/A  Psychiatric Specialty Exam: See H&P  Physical Exam: See H&P   COGNITIVE FEATURES THAT CONTRIBUTE TO RISK:  None  SUICIDE RISK:   Moderate:  Frequent suicidal ideation with limited intensity, and duration, some specificity in terms of plans, no associated intent, good self-control, limited dysphoria/symptomatology, some risk  factors present, and identifiable protective factors, including available and accessible social support.  PLAN OF CARE: See H&P for assessment and plan.   I certify that inpatient services furnished can reasonably be expected to improve the patient's condition.   Christene Slates, MD 05/31/2022, 7:28 AM

## 2022-05-31 NOTE — Hospital Course (Addendum)
Brought in for worsening agitation, paranoia,  psychosis,. Threatening to kill others online per father" , medication non-adherence (father says patient takes meds sporadically)  UDS + cannabis   Darnay Schwamberger is a 37 yo male with a PPHx of schizophrenia and GAD, with multiple inpatient hospitalizations (4 prior), presenting voluntarily    Legal Guardian: No  Fhx:    PMHx: Dx: Metabolic syndrome, dysphagia, cholecystitis, epididymitis 05/29/2021, HTN Surg: Lap chole 02/02/2019   PPHx: [facility]ed [facility]- for management of psych meds.  ""  Meds: Risperdal 0.5 mg daily, trazodone 50 mg, hydroxyzine, cogentin  Cymbalta makes him feel awful   Previous Hospitalizations:   SH: "Pt was born and raised in [place] area. He lives with his parens. He completed a BS in sports management. He does not work currently. He has never been married and he has no children. He denies legal and trauma history. "

## 2022-05-31 NOTE — [facility] Suicide Risk Assessment (Signed)
[place] INPATIENT:  Family/Significant Other Suicide Prevention Education  Suicide Prevention Education:  Education Completed; Gilbert Meyers,  [phone] (name of family member/significant other) has been identified by the patient as the family member/significant other with whom the patient will be residing, and identified as the person(s) who will aid the patient in the event of a mental health crisis (suicidal ideations/suicide attempt).  With written consent from the patient, the family member/significant other has been provided the following suicide prevention education, prior to the and/or following the discharge of the patient.  CSW spoke to patient mother who states that he has not been taking meds reliably.  She states that he doesn't sleep much or eat well.  She reports that he has been more fearful at his job and that he said he was going to [employer] on social media.  Reports that anxiety has increased and that he "Vapes all the time." Mother states they are working on getting him connected with [facility] and agreed to have the application sent to social work office.  No guns/weapons.  Patient not normally violent and she does not have additional safety concerns.   The suicide prevention education provided includes the following: Suicide risk factors Suicide prevention and interventions National Suicide Hotline telephone number [facility] assessment telephone number [facility] [place] and/or Residential Mobile Crisis Unit telephone number  Request made of family/significant other to: Remove weapons (e.g., guns, rifles, knives), all items previously/currently identified as safety concern.   Remove drugs/medications (over-the-counter, prescriptions, illicit drugs), all items previously/currently identified as a safety concern.  The family member/significant other verbalizes understanding of the suicide prevention education information provided.   The family member/significant other agrees to remove the items of safety concern listed above.  Claudell Wohler E Chea Malan 05/31/2022, 3:17 PM

## 2022-05-31 NOTE — Group Note (Signed)
Date:  05/31/2022 Time:  9:50 PM  Group Topic/Focus:  Wrap-Up Group:   The focus of this group is to help patients review their daily goal of treatment and discuss progress on daily workbooks.    Participation Level:  Active  Participation Quality:  Appropriate  Affect:  Appropriate  Cognitive:  Appropriate  Insight: Appropriate  Engagement in Group:  Engaged  Modes of Intervention:  Education and Exploration  Additional Comments:  Patient attended and participated in group tonight. He reports that his day was a 9 because he has been working with his doctor to get medication right, He stated that hje like that he is the life jof the party.  Gilbert Meyers [facility] 05/31/2022, 9:50 PM

## 2022-05-31 NOTE — Progress Notes (Signed)
   05/31/22 2100  Psych Admission Type (Psych Patients Only)  Admission Status Voluntary  Psychosocial Assessment  Patient Complaints Anxiety  Eye Contact Fair  Facial Expression Anxious  Affect Appropriate to circumstance;Anxious  Speech Logical/coherent  Interaction Assertive  Motor Activity Fidgety  Appearance/Hygiene In scrubs  Behavior Characteristics Cooperative;Calm  Mood Anxious;Pleasant  Aggressive Behavior  Effect No apparent injury  Thought Process  Coherency Circumstantial  Content Blaming others  Delusions None reported or observed  Perception WDL  Hallucination None reported or observed  Judgment Impaired  Confusion None  Danger to Self  Current suicidal ideation? Denies  Danger to Others  Danger to Others None reported or observed  Danger to Others Abnormal  Harmful Behavior to others No threats or harm toward other people  Destructive Behavior No threats or harm toward property

## 2022-05-31 NOTE — Group Note (Signed)
Date:  05/31/2022 Time:  6:28 PM  Group Topic/Focus:  Goals Group:   The focus of this group is to help patients establish daily goals to achieve during treatment and discuss how the patient can incorporate goal setting into their daily lives to aide in recovery. Orientation:   The focus of this group is to educate the patient on the purpose and policies of crisis stabilization and provide a format to answer questions about their admission.  The group details unit policies and expectations of patients while admitted.    Participation Level:  Active  Participation Quality:  Appropriate  Affect:  Appropriate  Cognitive:  Appropriate  Insight: Appropriate  Engagement in Group:  Engaged  Modes of Intervention:  Discussion  Additional Comments:  pt wants to work on being a better person and being patient with people  Garvin Fila 05/31/2022, 6:28 PM

## 2022-05-31 NOTE — H&P (Addendum)
Psychiatric Admission Assessment Adult  Patient Identification: Gilbert Meyers MRN:  [id number] Date of Evaluation:  05/31/2022 Chief Complaint:  Schizophrenia ([place]) [F20.9] Principal Diagnosis: Schizophrenia ([place]) Diagnosis:  Principal Problem:   Schizophrenia ([place])    CC: "I just got upset"  Gilbert Meyers is a 37 yo male with a PPHx of schizophrenia,GAD, and cannabis use disorder with 4 prior inpatient hospitalizations, presenting voluntarily to [facility] for worsening agitation, paranoia, and psychosis in the setting of medication nonadherence and cannabis use.  Admitted on 05/30/2022.  PRN medication prior to evaluation:  Tylenol 1X Atarax 1X   Collateral Information: Mother,  Gilbert Meyers at [phone]: Reports the patient began exhibiting paranoia and bizarre behavior on Tuesday (05/29/2022) morning, told mom he was not going to go to work that day because people were out to kill him.  Mother discovered that patient had been posting violent and graphic post about killing people on Facebook.  Mother had to leave for an appointment, witnessed from the house camera that the patient was pacing around the house, screaming.  [facility] was contacted and assisted in transporting patient to [facility] ED.  Reports patient has not been himself since December, they have a lot of trouble keeping the patient compliant on his psychiatric medications.  Patient reports that he does not need the medication, complains of sedation.  Mother reports that patient is much calmer on Risperdal, has not appreciated any adverse side effects.  Also reports the patient has poor sleep hygiene, often staying awake throughout the night.  Reports the patient will often vape 2 pens at a time.  They were working on getting the patient set up with the [employer] [facility] team.  They report there are no weapons in the house, patient has no history of violence, homicidal ideation, or suicidal ideation.   HPI:  Patient evaluated at  bedside, reports that Tuesday morning (05/29/2022), he got into an argument with his parents.  Describes feeling frustrated with how his life is going, feels that he is not making enough money working at [employer], and feels frustrated that he is still living with his parents at his age.  He also admits to missing several doses of his Risperdal prior to this altercation.  Patient vaguely alludes to paranoid ideations at this time, clarified with collateral call above.  Denies any threats of violence, reports "I just got upset, it was a misunderstanding".  On assessment, patient denies any auditory or visual hallucinations.  He denies thought insertion, thought withdrawal, and ideas of reference.  Denies any paranoid ideations presently.  He does endorse a history of paranoia, reports that he feels worried about being targeted while he is at work.  He often drives home feeling like he is being followed.  Otherwise denies any other perceptual disturbances.  He reports he smokes delta 8/delta 9 synthetic cannabis on a daily basis, does not believe his cannabis use contributes to his psychosis/paranoia in any way.  Regarding his history of medication noncompliance, patient reports that he has a hard time taking his medications because of his busy schedule.  Discussed option for LAI, states "I rather just keep taking my pills".  Per collateral call with mom, patient has made complaints in the past about feeling sedated on Risperdal.  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  Explains that he is able to do this because of a friend that changed baseball  teams.  Believes this hypermobility may kill him in the future.  Patient denies suicidal ideation.  Denies any history of suicide attempts or suicidal ideation  On psychiatric review of systems, patient denies any symptoms indicative of  depression, when assessing for guilt, anhedonia, diminished concentration, changes in appetite or sleep, he simply states "I do not know".  Answers same when screening for history of mania.  Regarding his diagnosis of GAD, he reports "I worry about some things".  Denies any difficulty controlling his worries.  Past Psychiatric Hx: Current Psychiatrist: DR. Hoyle Barr at [facility], last visit 1 month ago Current Therapist: Denies Previous Psych Diagnoses: Schizophrenia, GAD Current psychiatric medications: Risperdal 1 mg every 12 hours, Cogentin 0.5 mg twice daily Psychiatric medication history: Trazodone-reports currently not taking for sleep Cymbalta-reports it has made him "violently suicidal" in the past Seroquel-reports it is too sedating Prior inpatient treatment: 4 prior hospitalizations Found on chart review: 03/24/2015-03/29/2015 (5 days) at [facility], [facility] Admitted for worsening psychosis in the context of medication noncompliance Started on Risperdal with plan to bridge to LAI 06/03/2017- 06/10/2017 (7 days) at [facility] "Worsening symptoms of psychosis including AH, poor intake of food and water for the past 5 weeks, anxiety, and somatic delusions regarding his jaw, throat and arm. " "3 previous inpatient stays with last being in 2017 to [facility]" History of suicide: as described in the HPI History of homicide or aggression: as described in the HPI Psychiatric medication compliance history: As described in HPI   Substance Abuse Hx: Alcohol: Reports he drinks 3-4 times a year Tobacco: Daily vape use Cannabis: Reports smoking delta 8/delta 9 synthetic cannabis daily for over 10 years.  Past Medical History: PCP: Denies Diagnoses on chart review: Metabolic syndrome, dysphagia, cholecystitis, epididymitis 05/29/2021, HTN Surg: Lap chole 02/02/2019 ALL: Denies Trauma: Denies Seizures: Denies  Family History: Paternal grandfather- prostate cancer "3 maternal cousins have bipolar  disorder, maternal cousin has history of overdose of methamphetamine, and maternal grandmother has history of unknown mental illness. "  Social History: Lives in [place] with mother, father and paternal grandfather. Education: Completed bachelor's in sports communications Work: Working at [employer] for the past 3 years Marital Status: Single Children: Denies Abuse: None except as detailed in HPI Legal: Denies Nature conservation officer: Denies     Total Time spent with patient: 45 minutes  Is the patient at risk to self? Yes.    Has the patient been a risk to self in the past 6 months? Yes.    Has the patient been a risk to self within the distant past? Yes.    Is the patient a risk to others? No.  Has the patient been a risk to others in the past 6 months? No.  Has the patient been a risk to others within the distant past? No.   [place] Scale:  [facility]mission (Current) from 05/30/2022 in [facility] ED from 05/29/2022 in [facility] Emergency Department at [facility] ED from 06/06/2021 in [facility] Emergency Department at [place] No Risk No Risk No Risk        Tobacco Screening:  Social History   Tobacco Use  Smoking Status Former   Packs/day: .1   Types: E-cigarettes, Cigarettes  Smokeless Tobacco Never    BH Tobacco Counseling     Are you interested in Tobacco Cessation Medications?  No, patient refused Counseled patient on smoking cessation:  Refused/Declined practical counseling Reason Tobacco Screening Not Completed:  Patient Refused Screening       Social History:  Social History   Substance and Sexual Activity  Alcohol Use No   Comment: former     Social History   Substance and Sexual Activity  Drug Use Yes   Types: Marijuana    Additional Social History: Marital status: Single Are you sexually active?: No What is your sexual orientation?: Heterosexual Has your sexual activity  been affected by drugs, alcohol, medication, or emotional stress?: None reported  Does patient have children?: No                         Allergies:   Allergies  Allergen Reactions   Cymbalta [Duloxetine Hcl]     "makes him feel awful"   Lab Results:  Results for orders placed or performed during the hospital encounter of 05/29/22 (from the past 48 hour(s))  Urinalysis, w/ Reflex to Culture (Infection Suspected) -Urine, Clean Catch     Status: Abnormal   Collection Time: 05/29/22  4:24 PM  Result Value Ref Range   Specimen Source URINE, CLEAN CATCH    Color, Urine YELLOW YELLOW   APPearance HAZY (A) CLEAR   Specific Gravity, Urine 1.023 1.005 - 1.030   pH 6.0 5.0 - 8.0   Glucose, UA NEGATIVE NEGATIVE mg/dL   Hgb urine dipstick NEGATIVE NEGATIVE   Bilirubin Urine NEGATIVE NEGATIVE   Ketones, ur NEGATIVE NEGATIVE mg/dL   Protein, ur NEGATIVE NEGATIVE mg/dL   Nitrite NEGATIVE NEGATIVE   Leukocytes,Ua NEGATIVE NEGATIVE   RBC / HPF 0-5 0 - 5 RBC/hpf   WBC, UA 0-5 0 - 5 WBC/hpf    Comment:        Reflex urine culture not performed if WBC <=10, OR if Squamous epithelial cells >5. If Squamous epithelial cells >5 suggest recollection.    Bacteria, UA NONE SEEN NONE SEEN   Squamous Epithelial / HPF 0-5 0 - 5 /HPF   Mucus PRESENT     Comment: Performed at [facility], [address], [place], [place] [address]  Rapid urine drug screen (hospital performed)     Status: Abnormal   Collection Time: 05/29/22  4:24 PM  Result Value Ref Range   Opiates NONE DETECTED NONE DETECTED   Cocaine NONE DETECTED NONE DETECTED   Benzodiazepines NONE DETECTED NONE DETECTED   Amphetamines NONE DETECTED NONE DETECTED   Tetrahydrocannabinol POSITIVE (A) NONE DETECTED   Barbiturates NONE DETECTED NONE DETECTED    Comment: (NOTE) DRUG SCREEN FOR MEDICAL PURPOSES ONLY.  IF CONFIRMATION IS NEEDED FOR ANY PURPOSE, NOTIFY LAB WITHIN 5 DAYS.  LOWEST DETECTABLE LIMITS FOR URINE DRUG  SCREEN Drug Class                     Cutoff (ng/mL) Amphetamine and metabolites    1000 Barbiturate and metabolites    200 Benzodiazepine                 200 Opiates and metabolites        300 Cocaine and metabolites        300 THC                            50 Performed at [facility], [address] [place], [place] [address]   SARS Coronavirus 2 by RT PCR (hospital order, performed in [facility] hospital lab) *cepheid single result test* Anterior Nasal Swab  Status: None   Collection Time: 05/30/22  2:38 AM   Specimen: Anterior Nasal Swab  Result Value Ref Range   SARS Coronavirus 2 by RT PCR NEGATIVE NEGATIVE    Comment: (NOTE) SARS-CoV-2 target nucleic acids are NOT DETECTED.  The SARS-CoV-2 RNA is generally detectable in upper and lower respiratory specimens during the acute phase of infection. The lowest concentration of SARS-CoV-2 viral copies this assay can detect is 250 copies / mL. A negative result does not preclude SARS-CoV-2 infection and should not be used as the sole basis for treatment or other patient management decisions.  A negative result may occur with improper specimen collection / handling, submission of specimen other than nasopharyngeal swab, presence of viral mutation(s) within the areas targeted by this assay, and inadequate number of viral copies (<250 copies / mL). A negative result must be combined with clinical observations, patient history, and epidemiological information.  Fact Sheet for Patients:   https://www.patel.info/  Fact Sheet for Healthcare Providers: https://hall.com/  This test is not yet approved or  cleared by the [place] FDA and has been authorized for detection and/or diagnosis of SARS-CoV-2 by FDA under an Emergency Use Authorization (EUA).  This EUA will remain in effect (meaning this test can be used) for the duration of the COVID-19 declaration under Section 564(b)(1) of  the Act, 21 U.S.C. section 360bbb-3(b)(1), unless the authorization is terminated or revoked sooner.  Performed at [facility], [address], [place], [place] [address]     Blood Alcohol level:  Lab Results  Component Value Date   [facility] <10 05/29/2022   ETH <10 [id number]    Metabolic Disorder Labs:  Lab Results  Component Value Date   HGBA1C 5.4 06/05/2017   MPG 108.28 06/05/2017   Lab Results  Component Value Date   PROLACTIN 53.0 (H) 06/05/2017   PROLACTIN 22.9 (H) 03/25/2015   Lab Results  Component Value Date   CHOL 142 06/05/2017   TRIG 163 (H) 06/05/2017   HDL 35 (L) 06/05/2017   CHOLHDL 4.1 06/05/2017   VLDL 33 06/05/2017   LDLCALC 74 06/05/2017   LDLCALC 136 (H) 03/25/2015    Current Medications: Current Facility-Administered Medications  Medication Dose Route Frequency Provider Last Rate Last Admin   acetaminophen (TYLENOL) tablet 650 mg  650 mg Oral Q6H PRN Derrill Center, NP   650 mg at 05/30/22 1835   alum & mag hydroxide-simeth (MAALOX/MYLANTA) 200-200-20 MG/5ML suspension 30 mL  30 mL Oral Q4H PRN Derrill Center, NP       benztropine (COGENTIN) tablet 0.5 mg  0.5 mg Oral BID Derrill Center, NP   0.5 mg at 05/31/22 [id number]   haloperidol (HALDOL) tablet 5 mg  5 mg Oral TID PRN Janine Limbo, MD       And   LORazepam (ATIVAN) tablet 2 mg  2 mg Oral TID PRN Janine Limbo, MD       And   diphenhydrAMINE (BENADRYL) capsule 50 mg  50 mg Oral TID PRN Massengill, Ovid Curd, MD       haloperidol lactate (HALDOL) injection 5 mg  5 mg Intramuscular TID PRN Massengill, Ovid Curd, MD       And   LORazepam (ATIVAN) injection 2 mg  2 mg Intramuscular TID PRN Massengill, Ovid Curd, MD       And   diphenhydrAMINE (BENADRYL) injection 50 mg  50 mg Intramuscular TID PRN Massengill, Ovid Curd, MD       hydrOXYzine (ATARAX) tablet 50 mg  50 mg Oral TID PRN Derrill Center, NP   50 mg at 05/31/22 0758   magnesium hydroxide (MILK OF MAGNESIA) suspension 30 mL  30 mL Oral  Daily PRN Derrill Center, NP       nicotine (NICODERM CQ - dosed in mg/24 hours) patch 21 mg  21 mg Transdermal Daily Massengill, Ovid Curd, MD   21 mg at 05/31/22 1300   risperiDONE (RISPERDAL) tablet 1 mg  1 mg Oral Q12H Carrion-Carrero, Ionna Avis, MD       traZODone (DESYREL) tablet 50 mg  50 mg Oral QHS PRN Derrill Center, NP       PTA Medications: Medications Prior to Admission  Medication Sig Dispense Refill Last Dose   benztropine (COGENTIN) 0.5 MG tablet Take 1 tablet (0.5 mg total) by mouth 2 (two) times daily. For prevention of drug induced tremors (Patient not taking: Reported on 05/29/2022) 60 tablet 0    hydrOXYzine (ATARAX/VISTARIL) 50 MG tablet Take 50 mg by mouth 3 (three) times daily as needed for anxiety.      ibuprofen (ADVIL) 800 MG tablet Take 1 tablet (800 mg total) by mouth 3 (three) times daily. 21 tablet 0    risperiDONE (RISPERDAL) 1 MG tablet Take 1 mg by mouth See admin instructions. 1 tablet in the morning and 1-2 every evening       Musculoskeletal: Strength & Muscle Tone: within normal limits Gait & Station: normal Patient leans: N/A  Psychiatric Specialty Exam:  Presentation  General Appearance:  Appropriate for Environment; Casual; Fairly Groomed  Eye Contact: Fair  Speech: Clear and Coherent; Normal Rate  Speech Volume: Normal  Handedness: Right   Mood and Affect  Mood: Euthymic  Affect: Appropriate; Full Range; Congruent   Thought Process  Thought Processes: Coherent; Goal Directed; Linear  Descriptions of Associations:Intact  Orientation:Full (Time, Place and Person)  Thought Content:Logical; WDL  History of Schizophrenia/Schizoaffective disorder:Yes  Duration of Psychotic Symptoms: Greater than 6 months Hallucinations:Hallucinations: None  Ideas of Reference:None  Suicidal Thoughts:Suicidal Thoughts: No  Homicidal Thoughts:Homicidal Thoughts: No   Sensorium  Memory: Immediate Fair; Remote Poor; Recent  Poor  Judgment: Poor  Insight: Poor   Executive Functions  Concentration: Fair  Attention Span: Fair  Recall: Fair  Fund of Knowledge: Fair  Language: Fair   Psychomotor Activity  Psychomotor Activity: Psychomotor Activity: Normal   Assets  Assets:Communication Skills; Desire for Improvement; Resilience   Sleep  Sleep: Sleep: Good    Physical Exam: Physical Exam Vitals and nursing note reviewed.  Constitutional:      Appearance: Normal appearance. He is normal weight.  Pulmonary:     Effort: Pulmonary effort is normal. No respiratory distress.  Neurological:     Mental Status: He is alert.    Review of Systems  Respiratory:  Negative for shortness of breath.   Cardiovascular:  Negative for chest pain.  Gastrointestinal:  Negative for abdominal pain, constipation and diarrhea.  Psychiatric/Behavioral:  Negative for depression, hallucinations, memory loss, substance abuse and suicidal ideas. The patient is not nervous/anxious and does not have insomnia.    Blood pressure 112/64, pulse 88, temperature 98.5 F (36.9 C), temperature source Oral, resp. rate 18, height 5\' 10"  (1.778 m), weight 79.8 kg, SpO2 98 %. Body mass index is 25.25 kg/m.   Treatment Plan Summary: Daily contact with patient to assess and evaluate symptoms and progress in treatment and Medication management   ASSESSMENT: Gilbert Meyers is a 37 yo male with a PPHx of schizophrenia,GAD, and  cannabis use disorder with 4 prior inpatient hospitalizations, presenting voluntarily to [facility] for worsening agitation, paranoia, and psychosis in the setting of medication nonadherence and cannabis use.  Admitted on 05/30/2022.  Diagnoses / Active Problems: Schizophrenia Cannabis use disorder Tobacco use disorder   PLAN: Safety and Monitoring:  -- VOLUNTARY admission to inpatient psychiatric unit for safety, stabilization and treatment  -- Daily contact with patient to assess and evaluate  symptoms and progress in treatment  -- Patient's case to be discussed in multi-disciplinary team meeting  -- Observation Level : q15 minute checks  -- Vital signs:  q12 hours  -- Precautions: suicide, elopement, and assault  2. Psychiatric Diagnoses and Treatment:  Restart home Risperdal 1 mg Q12Hrs for paranoia and psychosis Ideally, transitioning to Almedia would be appropriate given patient's hx of noncompliance. Restart home Cogentin 0.5 mg BID for EPS ppx Plan is to taper down and d/c this medication during his hospitalization -- The risks/benefits/side-effects/alternatives to this medication were discussed in detail with the patient and time was given for questions. The patient consents to medication trial.              -- Metabolic profile and EKG monitoring obtained while on an atypical antipsychotic  BMI: 25.25 kg/m TSH: pending Lipid Panel: pending  HbgA1c: pending QTc: 396             -- Encouraged patient to participate in unit milieu and in scheduled group therapies   -- Short Term Goals: Ability to identify changes in lifestyle to reduce recurrence of condition will improve and Ability to verbalize feelings will improve  -- Long Term Goals: Improvement in symptoms so as ready for discharge    3. Medical Issues Being Addressed:   Tobacco Use Disorder Nicotine patch 21mg /24 hours ordered Smoking cessation encouraged  4. Discharge Planning:   -- Social work and case management to assist with discharge planning and identification of hospital follow-up needs prior to discharge  -- Estimated LOS: 5-7 days  -- Discharge Concerns: Need to establish a safety plan; Medication compliance and effectiveness  -- Discharge Goals: Return home with outpatient referrals for mental health follow-up including medication management/psychotherapy   I certify that inpatient services furnished can reasonably be expected to improve the patient's condition.    Signed: Dr. Jacques Navy, MD PGY-1, Psychiatry Residency  3/28/20244:21 PM

## 2022-05-31 NOTE — [facility] Counselor (Signed)
Adult Comprehensive Assessment  Patient ID: Gilbert Meyers, male   DOB: 1985/03/28, 37 y.o.   MRN: [id number]  Information Source: Information source: Patient  Current Stressors:  Patient states their primary concerns and needs for treatment are:: Pt states that he was arguing with the sherriff Patient states their goals for this hospitilization and ongoing recovery are:: "I want to leave here with my own mind" Educational / Learning stressors: no stressors Employment / Job issues: patient working at [employer] for 3 years, he states that the work can be stressful at times as well as driving home at night Family Relationships: patient reports good relationship with family.  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 Social relationships: patient states that he has supportive friends Substance abuse: occassional alcohol and marijuana use Bereavement / Loss: no stressors  Living/Environment/Situation:  Living Arrangements: Parent Living conditions (as described by patient or guardian): patient states that it is comfortable but stressful Who else lives in the home?: mother, father and grandfather How long has patient lived in current situation?: patient states most of his life What is atmosphere in current home: Comfortable, Other (Comment) (stressful)  Family History:  Marital status: Single Are you sexually active?: No What is your sexual orientation?: Heterosexual Has your sexual activity been affected by drugs, alcohol, medication, or emotional stress?: None reported  Does patient have children?: No  Childhood History:  By whom was/is the patient raised?: Both parents Additional childhood history  information: patient states he has a good relationship with parents and had a good childhood Description of patient's relationship with caregiver when they were a child: Good relationship Patient's description of current relationship with people who raised him/her: patient states he has a great relationship with his mother, he reports that he can sometimes get into fights with his dad because dad is always telling him what to do How were you disciplined when you got in trouble as a child/adolescent?: groundings Does patient have siblings?: Yes Number of Siblings: 2 Description of patient's current relationship with siblings: good relationship with siblings Did patient suffer any verbal/emotional/physical/sexual abuse as a child?: No Did patient suffer from severe childhood neglect?: No Has patient ever been sexually abused/assaulted/raped as an adolescent or adult?: No Was the patient ever a victim of a crime or a disaster?: No Witnessed domestic violence?: No Has patient been affected by domestic violence as an adult?: No Description of domestic violence: N/A  Education:  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 Currently a Ship broker?: No Learning disability?: No  Employment/Work Situation:   Work Stressors: 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 Patient's Job has Been Impacted by Current Illness: No What is the Longest Time Patient has Held a Job?: 3 years Where was the Patient Employed at that Time?: [employer] Has Patient ever Been in the [employer]?: No  Financial Resources:   Museum/gallery curator resources: Income from employment, Support from parents / caregiver Does patient have a Programmer, applications or guardian?: No  Alcohol/Substance Abuse:   What has been your use of drugs/alcohol within the last 12 months?: occassional marijuana and alcohol use If attempted suicide, did drugs/alcohol play a role in this?: No Alcohol/Substance Abuse Treatment Hx: Denies past  history Has alcohol/substance abuse ever caused legal  problems?: No  Social Support System:   Pensions consultant Support System: Fair Dietitian Support System: Family  Leisure/Recreation:   Do You Have Hobbies?: Yes Leisure and Hobbies: Golf  Strengths/Needs:   What is the patient's perception of their strengths?: patient states he is good at sports Patient states they can use these personal strengths during their treatment to contribute to their recovery: yes Patient states these barriers may affect/interfere with their treatment: none Patient states these barriers may affect their return to the community: none Other important information patient would like considered in planning for their treatment: none  Discharge Plan:   Currently receiving community mental health services: Yes (From Whom) [facility]) Patient states concerns and preferences for aftercare planning are: patient open to changing providers Patient states they will know when they are safe and ready for discharge when: patient states that he will have a good head on his shoulders and can handle stressors in his environment. Does patient have access to transportation?: Yes Does patient have financial barriers related to discharge medications?: No Patient description of barriers related to discharge medications: none Will patient be returning to same living situation after discharge?: Yes  Summary/Recommendations:   Summary and Recommendations (to be completed by the evaluator): Gilbert Meyers is a 37 year old male who was admitted to [facility] for acting bizarre.  He reports that he got in an altercation with his father and then yelled at the police that came.  He reports that he has some stressors including working at [employer] and the fact that he still lives with his parents.  He reports that the home environment is comfortable and stressful.  He reports some disagreements with his father at times.  Patient reports that he will  occassionally go to [facility] for med management, however he is interested in changing providers. While here, Cher can benefit from crisis stabilization, medication management, therapeutic milieu, and referrals for services.   Dorenda Pfannenstiel E Shyra Emile. 05/31/2022

## 2022-06-01 ENCOUNTER — Encounter (HOSPITAL_COMMUNITY): Payer: Self-pay

## 2022-06-01 DIAGNOSIS — F209 Schizophrenia, unspecified: Secondary | ICD-10-CM | POA: Diagnosis not present

## 2022-06-01 LAB — LIPID PANEL
Cholesterol: 187 mg/dL (ref 0–200)
HDL: 57 mg/dL (ref >40)
LDL Cholesterol: 110 mg/dL — ABNORMAL HIGH (ref 0–99)
Total CHOL/HDL Ratio: 3.3 RATIO
Triglycerides: 99 mg/dL (ref <150)
VLDL: 20 mg/dL (ref 0–40)

## 2022-06-01 LAB — TSH: TSH: 4.244 u[IU]/mL (ref 0.350–4.500)

## 2022-06-01 LAB — HEMOGLOBIN A1C
Hgb A1c MFr Bld: 5.5 % (ref 4.8–5.6)
Mean Plasma Glucose: 111 mg/dL

## 2022-06-01 MED ORDER — RISPERIDONE ER 50 MG/0.14ML ~~LOC~~ SUSY
50.0000 mg | PREFILLED_SYRINGE | Freq: Once | SUBCUTANEOUS | Status: AC
  Administered 2022-06-02: 50 mg via SUBCUTANEOUS
  Filled 2022-06-01: qty 1

## 2022-06-01 NOTE — Progress Notes (Signed)
Pt transferred from 500 hall.Adjusting well on 400 hall.  Pt appears to be calm and no issues are noted at this time.

## 2022-06-01 NOTE — Progress Notes (Signed)
[facility] MD Progress Note  06/01/2022 2:11 PM Gilbert Meyers  MRN:  [id number]  Subjective:   Gilbert Meyers is a 37 yo male with a PPHx of schizophrenia,GAD, and cannabis use disorder with 4 prior inpatient hospitalizations, presenting voluntarily to [facility] for worsening agitation, paranoia, and psychosis in the setting of medication nonadherence and cannabis use.   On admission, we confirmed the above diagnoses.  We also decided to restart the Risperdal and increase the dose to 1 mg twice daily (unclear what home dose actually is, I talked to the patient's parents today who stated that his home dose could been 1 mg twice daily or 1 mg in the morning and 2 mg at bedtime, nonetheless patient was not taking the Risperdal at home for some time).  On evaluation today, the patient is calm.  He is lying bed.  He is logical and linear speech.  He denies feeling paranoid or suspicious.  Reports anxiety level is low.  Denies any AH or VH.  Denies any SI or HI.  Reports his mood is calm.  Denies feeling depressed.  Denies any side effects to restarting Restoril.  No EPS on physical exam today.  He is agreeable to LAI.  Reports sleep is okay.  Appetite is okay.  See my separate progress note, about my conversation with the patient's parents, with the patient's permission.  They are both LAI.  I will reach out to the pharmacy to order the Risperdal Uzedy 50 mg IM LAI and administer when it is available from the pharmacy.       Principal Problem: Schizophrenia Diagnosis: Principal Problem:   Schizophrenia ([place]) Active Problems:   Cannabis use disorder   Tobacco use disorder  Total Time spent with patient: 20 minutes  Past Psychiatric History:  Current Psychiatrist: DR. Hoyle Barr at [facility], last visit 1 month ago Current Therapist: Denies Previous Psych Diagnoses: Schizophrenia, GAD Current psychiatric medications: Risperdal 1 mg every 12 hours, Cogentin 0.5 mg twice daily Psychiatric medication  history: Trazodone-reports currently not taking for sleep Cymbalta-reports it has made him "violently suicidal" in the past Seroquel-reports it is too sedating Prior inpatient treatment: 4 prior hospitalizations Found on chart review: 03/24/2015-03/29/2015 (5 days) at [facility], [facility] Admitted for worsening psychosis in the context of medication noncompliance Started on Risperdal with plan to bridge to LAI 06/03/2017- 06/10/2017 (7 days) at [facility] "Worsening symptoms of psychosis including AH, poor intake of food and water for the past 5 weeks, anxiety, and somatic delusions regarding his jaw, throat and arm. " "3 previous inpatient stays with last being in 2017 to [place]" History of suicide: as described in the HPI History of homicide or aggression: as described in the HPI Psychiatric medication compliance history: As described in HPI   Past Medical History:  Past Medical History:  Diagnosis Date   Anxiety    Bipolar 1 disorder ([place])    Schizophrenia ([place])     Past Surgical History:  Procedure Laterality Date   CHOLECYSTECTOMY N/A 02/02/2019   Procedure: LAPAROSCOPIC CHOLECYSTECTOMY;  Surgeon: Virl Cagey, MD;  Location: [facility];  Service: General;  Laterality: N/A;   Family History:  Family History  Problem Relation Age of Onset   Prostate cancer Paternal Grandfather    Colon cancer Neg Hx    Family Psychiatric  History: See H&P  Social History:  Social History   Substance and Sexual Activity  Alcohol Use No   Comment: former     Social History   Substance and Sexual  Activity  Drug Use Yes   Types: Marijuana    Social History   Socioeconomic History   Marital status: Single    Spouse name: Not on file   Number of children: 0   Years of education: Not on file   Highest education level: Not on file  Occupational History   Occupation: Unemployed   Tobacco Use   Smoking status: Former    Packs/day: .1    Types: E-cigarettes, Cigarettes    Smokeless tobacco: Never  Vaping Use   Vaping Use: Former  Substance and Sexual Activity   Alcohol use: No    Comment: former   Drug use: Yes    Types: Marijuana   Sexual activity: Not Currently    Birth control/protection: Condom  Other Topics Concern   Not on file  Social History Narrative   Not on file   Social Determinants of Health   Financial Resource Strain: Not on file  Food Insecurity: No Food Insecurity (05/30/2022)   Hunger Vital Sign    Worried About Running Out of Food in the Last Year: Never true    Ran Out of Food in the Last Year: Never true  Transportation Needs: No Transportation Needs (05/30/2022)   PRAPARE - Hydrologist (Medical): No    Lack of Transportation (Non-Medical): No  Physical Activity: Not on file  Stress: Not on file  Social Connections: Not on file   Additional Social History:                           Current Medications: Current Facility-Administered Medications  Medication Dose Route Frequency Provider Last Rate Last Admin   acetaminophen (TYLENOL) tablet 650 mg  650 mg Oral Q6H PRN Derrill Center, NP   650 mg at 06/01/22 1129   alum & mag hydroxide-simeth (MAALOX/MYLANTA) 200-200-20 MG/5ML suspension 30 mL  30 mL Oral Q4H PRN Derrill Center, NP       benztropine (COGENTIN) tablet 0.5 mg  0.5 mg Oral BID Derrill Center, NP   0.5 mg at 06/01/22 0746   haloperidol (HALDOL) tablet 5 mg  5 mg Oral TID PRN Janine Limbo, MD       And   LORazepam (ATIVAN) tablet 2 mg  2 mg Oral TID PRN Janine Limbo, MD       And   diphenhydrAMINE (BENADRYL) capsule 50 mg  50 mg Oral TID PRN Daryl Quiros, Ovid Curd, MD       haloperidol lactate (HALDOL) injection 5 mg  5 mg Intramuscular TID PRN Gloria Lambertson, Ovid Curd, MD       And   LORazepam (ATIVAN) injection 2 mg  2 mg Intramuscular TID PRN Syniyah Bourne, Ovid Curd, MD       And   diphenhydrAMINE (BENADRYL) injection 50 mg  50 mg Intramuscular TID PRN Gilbert Narain,  Ovid Curd, MD       hydrOXYzine (ATARAX) tablet 50 mg  50 mg Oral TID PRN Derrill Center, NP   50 mg at 05/31/22 2035   magnesium hydroxide (MILK OF MAGNESIA) suspension 30 mL  30 mL Oral Daily PRN Derrill Center, NP       nicotine (NICODERM CQ - dosed in mg/24 hours) patch 21 mg  21 mg Transdermal Daily Gera Inboden, Ovid Curd, MD   21 mg at 06/01/22 0747   risperiDONE (RISPERDAL) tablet 1 mg  1 mg Oral Q12H Carrion-Carrero, Margely, MD   1 mg at 06/01/22 [phone]  traZODone (DESYREL) tablet 50 mg  50 mg Oral QHS PRN Derrill Center, NP   50 mg at 05/31/22 2034    Lab Results:  Results for orders placed or performed during the hospital encounter of 05/30/22 (from the past 48 hour(s))  Lipid panel     Status: Abnormal   Collection Time: 06/01/22  6:27 AM  Result Value Ref Range   Cholesterol 187 0 - 200 mg/dL   Triglycerides 99 <150 mg/dL   HDL 57 >40 mg/dL   Total CHOL/HDL Ratio 3.3 RATIO   VLDL 20 0 - 40 mg/dL   LDL Cholesterol 110 (H) 0 - 99 mg/dL    Comment:        Total Cholesterol/HDL:CHD Risk Coronary Heart Disease Risk Table                     Men   Women  1/2 Average Risk   3.4   3.3  Average Risk       5.0   4.4  2 X Average Risk   9.6   7.1  3 X Average Risk  23.4   11.0        Use the calculated Patient Ratio above and the CHD Risk Table to determine the patient's CHD Risk.        ATP III CLASSIFICATION (LDL):  <100     mg/dL   Optimal  100-129  mg/dL   Near or Above                    Optimal  130-159  mg/dL   Borderline  160-189  mg/dL   High  >190     mg/dL   Very High Performed at [facility] [address] [place], [place] [address]   TSH     Status: None   Collection Time: 06/01/22  6:27 AM  Result Value Ref Range   TSH 4.244 0.350 - 4.500 uIU/mL    Comment: Performed by a 3rd Generation assay with a functional sensitivity of <=0.01 uIU/mL. Performed at [facility], [place] [address], [place], [place] [address]      Blood Alcohol level:  Lab Results  Component Value Date   [facility] <10 05/29/2022   ETH <10 123XX123    Metabolic Disorder Labs: Lab Results  Component Value Date   HGBA1C 5.4 06/05/2017   MPG 108.28 06/05/2017   Lab Results  Component Value Date   PROLACTIN 53.0 (H) 06/05/2017   PROLACTIN 22.9 (H) 03/25/2015   Lab Results  Component Value Date   CHOL 187 06/01/2022   TRIG 99 06/01/2022   HDL 57 06/01/2022   CHOLHDL 3.3 06/01/2022   VLDL 20 06/01/2022   LDLCALC 110 (H) 06/01/2022   LDLCALC 74 06/05/2017    Physical Findings: AIMS:  , ,  ,  ,    CIWA:    COWS:     Aims score zero on my exam. No eps on my exam.  Musculoskeletal: Strength & Muscle Tone: within normal limits Gait & Station: normal Patient leans: N/A  Psychiatric Specialty Exam:  Presentation  General Appearance:  Appropriate for Environment; Casual; Fairly Groomed  Eye Contact: Fair  Speech: Clear and Coherent; Normal Rate  Speech Volume: Normal  Handedness: Right   Mood and Affect  Mood: Euthymic  Affect: Appropriate; Full Range; Congruent   Thought Process  Thought Processes: Coherent; Goal Directed; Linear  Descriptions of Associations:Intact  Orientation:Full (Time,  Place and Person)  Thought Content:Logical; WDL  History of Schizophrenia/Schizoaffective disorder:Yes  Duration of Psychotic Symptoms:No data recorded more than months  Hallucinations:Hallucinations: None  Ideas of Reference:None Parents have concern for paranoia, but patient denies  Suicidal Thoughts:Suicidal Thoughts: No  Homicidal Thoughts:Homicidal Thoughts: No   Sensorium  Memory: Immediate Fair; Remote Poor; Recent Poor  Judgment: Poor  Insight: Poor   Executive Functions  Concentration: Fair  Attention Span: Fair  Recall: [place] of Knowledge: Fair  Language: Fair   Psychomotor Activity  Psychomotor Activity: Psychomotor Activity: Normal   Assets   Assets: Communication Skills; Desire for Improvement; Resilience   Sleep  Sleep: Sleep: Good    Physical Exam: Physical Exam Vitals reviewed.  Constitutional:      General: He is not in acute distress.    Appearance: He is normal weight. He is not toxic-appearing.  Pulmonary:     Effort: Pulmonary effort is normal.  Neurological:     Mental Status: He is alert.     Motor: No weakness.     Gait: Gait normal.    Review of Systems  Constitutional:  Negative for chills and fever.  Cardiovascular:  Negative for chest pain and palpitations.  Neurological:  Negative for dizziness, tingling, tremors and headaches.  Psychiatric/Behavioral:  Negative for depression and suicidal ideas. The patient is nervous/anxious.   All other systems reviewed and are negative.  Blood pressure 102/66, pulse 91, temperature 97.7 F (36.5 C), temperature source Oral, resp. rate 16, height 5\' 10"  (1.778 m), weight 79.8 kg, SpO2 98 %. Body mass index is 25.25 kg/m.   Treatment Plan Summary: Daily contact with patient to assess and evaluate symptoms and progress in treatment and Medication management  ASSESSMENT: Gilbert Meyers is a 37 yo male with a PPHx of schizophrenia,GAD, and cannabis use disorder with 4 prior inpatient hospitalizations, presenting voluntarily to [facility] for worsening agitation, paranoia, and psychosis in the setting of medication nonadherence and cannabis use.  Admitted on 05/30/2022.   Diagnoses / Active Problems: Schizophrenia Cannabis use disorder Tobacco use disorder     PLAN: Safety and Monitoring:             -- VOLUNTARY admission to inpatient psychiatric unit for safety, stabilization and treatment             -- Daily contact with patient to assess and evaluate symptoms and progress in treatment             -- Patient's case to be discussed in multi-disciplinary team meeting             -- Observation Level : q15 minute checks             -- Vital signs:  q12  hours             -- Precautions: suicide, elopement, and assault   2. Psychiatric Diagnoses and Treatment:  Continue Risperdal 1 mg Q12Hrs for paranoia and psychosis.  Recommend decreasing the oral dose of Risperdal over the weekend, after patient receives the Risperdal LAI Order Risperdal Uzedy 50 mg IM LAI (2 mg conversion to 1 month) for schizophrenia.  Patient decompensation in context of medication noncompliance and also substance use. Continue home Cogentin 0.5 mg BID for EPS ppx Plan is to taper down and d/c this medication during his hospitalization   -- The risks/benefits/side-effects/alternatives to this medication were discussed in detail with the patient and time was given for questions. The patient consents to medication trial.              --  Metabolic profile and EKG monitoring obtained while on an atypical antipsychotic  BMI: 25.25 kg/m TSH: pending Lipid Panel: pending  HbgA1c: pending QTc: 396             -- Encouraged patient to participate in unit milieu and in scheduled group therapies              -- Short Term Goals: Ability to identify changes in lifestyle to reduce recurrence of condition will improve and Ability to verbalize feelings will improve             -- Long Term Goals: Improvement in symptoms so as ready for discharge                3. Medical Issues Being Addressed:    Tobacco Use Disorder Nicotine patch 21mg /24 hours ordered Smoking cessation encouraged   4. Discharge Planning:              -- Social work and case management to assist with discharge planning and identification of hospital follow-up needs prior to discharge             -- Estimated LOS: 3-4 more days              -- Discharge Concerns: Need to establish a safety plan; Medication compliance and effectiveness             -- Discharge Goals: Return home with outpatient referrals for mental health follow-up including medication management/psychotherapy    Christoper Allegra,  MD 06/01/2022, 2:11 PM  Total Time Spent in Direct Patient Care:  I personally spent 40 minutes on the unit in direct patient care. The direct patient care time included face-to-face time with the patient, reviewing the patient's chart, communicating with other professionals, and coordinating care. Greater than 50% of this time was spent in counseling or coordinating care with the patient regarding goals of hospitalization, psycho-education, and discharge planning needs.   Janine Limbo, MD Psychiatrist

## 2022-06-01 NOTE — Progress Notes (Signed)
Kaia, [place] Mother [phone]  [phone]  I called the person listed above, with the pt's permission.  She confirms information in the H&P and ED notes.  Seems a lot calmer. Was more conversational with mother. Anxiety is less.  Less paranoia but not totally resolved since restarting medications.  Did avoid some eye contact.   They agree that LAI would be in pt's best interest. We discussed converting current oral dose to LAI, and that the LAI dose could be incr as outpatient.   We discussed that if pt continues to be stable over the weekend, we will consider discharge as early as Monday.   They state that pt has both medicaid and BCBS.   At the end of the call, the mother and father (who joined the call) had no further questions.

## 2022-06-01 NOTE — Progress Notes (Signed)
Recreation Therapy Notes  INPATIENT RECREATION THERAPY ASSESSMENT  Patient Details Name: Gilbert Meyers MRN: [id number] DOB: 05-31-1985 Today's Date: 06/01/2022       Information Obtained From: Patient  Able to Participate in Assessment/Interview: Yes  Patient Presentation: Alert  Reason for Admission (Per Patient): Other (Comments) (Pt stated he was arrested by the sheriff's office.)  Patient Stressors: Other (Comment) ("life in general")  Coping Skills:   Isolation, TV, Sports, Deep Breathing, Hot Bath/Shower, Music, Exercise, Art, Talk, Prayer, Avoidance, Read  Leisure Interests (2+):  Individual - TV, Individual - Phone, Petra Kuba - Other (Comment)  Frequency of Recreation/Participation: Other (Comment) (Daily)  Awareness of Community Resources:  Yes  Community Resources:  Engineer, drilling, Patent examiner, [employer]  Current Use: Yes  If no, Barriers?:    Expressed Interest in [place]: No  [employer] of Residence:  Investment banker, corporate  Patient Main Form of Transportation: Musician  Patient Strengths:  Scientist, research (physical sciences); Love people  Patient Identified Areas of Improvement:  "handling situations when I get upset better; take meds better, look for God better"  Patient Goal for Hospitalization:  "to get better stablilized as a person before leaving here"  Current SI (including self-harm):  No  Current HI:  No  Current AVH: No  Staff Intervention Plan: Group Attendance, Collaborate with Interdisciplinary Treatment Team  Consent to Intern Participation: N/A   Kolette Vey-McCall, LRT,CTRS Deepti Gunawan A Katrenia Alkins-McCall 06/01/2022, 8:47 AM

## 2022-06-01 NOTE — Group Note (Signed)
Date:  06/01/2022 Time:  9:47 AM  Group Topic/Focus:  Goals Group:   The focus of this group is to help patients establish daily goals to achieve during treatment and discuss how the patient can incorporate goal setting into their daily lives to aide in recovery. Orientation:   The focus of this group is to educate the patient on the purpose and policies of crisis stabilization and provide a format to answer questions about their admission.  The group details unit policies and expectations of patients while admitted.    Participation Level:  Did Not Attend  Participation Quality:    Affect:    Cognitive:    Insight:   Engagement in Group:    Modes of Intervention:    Additional Comments:    Garvin Fila 06/01/2022, 9:47 AM

## 2022-06-01 NOTE — BH IP Treatment Plan (Signed)
Interdisciplinary Treatment and Diagnostic Plan New  06/01/2022 Time of Session: [place] MRN: [id number]  Principal Diagnosis: Schizophrenia  Secondary Diagnoses: Principal Problem:   Schizophrenia ([place]) Active Problems:   Cannabis use disorder   Tobacco use disorder   Current Medications:  Current Facility-Administered Medications  Medication Dose Route Frequency Provider Last Rate Last Admin   acetaminophen (TYLENOL) tablet 650 mg  650 mg Oral Q6H PRN Derrill Center, NP   650 mg at 06/01/22 1129   alum & mag hydroxide-simeth (MAALOX/MYLANTA) 200-200-20 MG/5ML suspension 30 mL  30 mL Oral Q4H PRN Derrill Center, NP       benztropine (COGENTIN) tablet 0.5 mg  0.5 mg Oral BID Derrill Center, NP   0.5 mg at 06/01/22 0746   haloperidol (HALDOL) tablet 5 mg  5 mg Oral TID PRN Janine Limbo, MD       And   LORazepam (ATIVAN) tablet 2 mg  2 mg Oral TID PRN Janine Limbo, MD       And   diphenhydrAMINE (BENADRYL) capsule 50 mg  50 mg Oral TID PRN Massengill, Ovid Curd, MD       haloperidol lactate (HALDOL) injection 5 mg  5 mg Intramuscular TID PRN Massengill, Ovid Curd, MD       And   LORazepam (ATIVAN) injection 2 mg  2 mg Intramuscular TID PRN Massengill, Ovid Curd, MD       And   diphenhydrAMINE (BENADRYL) injection 50 mg  50 mg Intramuscular TID PRN Massengill, Ovid Curd, MD       hydrOXYzine (ATARAX) tablet 50 mg  50 mg Oral TID PRN Derrill Center, NP   50 mg at 05/31/22 2035   magnesium hydroxide (MILK OF MAGNESIA) suspension 30 mL  30 mL Oral Daily PRN Derrill Center, NP       nicotine (NICODERM CQ - dosed in mg/24 hours) patch 21 mg  21 mg Transdermal Daily Massengill, Ovid Curd, MD   21 mg at 06/01/22 0747   risperiDONE (RISPERDAL) tablet 1 mg  1 mg Oral Q12H Carrion-Carrero, Margely, MD   1 mg at 06/01/22 0746   traZODone (DESYREL) tablet 50 mg  50 mg Oral QHS PRN Derrill Center, NP   50 mg at 05/31/22 2034   PTA Medications: Medications Prior to Admission   Medication Sig Dispense Refill Last Dose   benztropine (COGENTIN) 0.5 MG tablet Take 1 tablet (0.5 mg total) by mouth 2 (two) times daily. For prevention of drug induced tremors (Patient not taking: Reported on 05/29/2022) 60 tablet 0    hydrOXYzine (ATARAX/VISTARIL) 50 MG tablet Take 50 mg by mouth 3 (three) times daily as needed for anxiety.      ibuprofen (ADVIL) 800 MG tablet Take 1 tablet (800 mg total) by mouth 3 (three) times daily. 21 tablet 0    risperiDONE (RISPERDAL) 1 MG tablet Take 1 mg by mouth See admin instructions. 1 tablet in the morning and 1-2 every evening       Patient Stressors:    Patient Strengths:    Treatment Modalities: Medication Management, Group therapy, Case management,  1 to 1 session with clinician, Psychoeducation, Recreational therapy.   Physician Treatment Plan for Primary Diagnosis: Schizophrenia Long Term Goal(s): Improvement in symptoms so as ready for discharge   Short Term Goals: Ability to identify changes in lifestyle to reduce recurrence of condition will improve Ability to verbalize feelings will improve  Medication Management: Evaluate patient's response, side effects, and tolerance of medication regimen.  Therapeutic Interventions: 1 to 1 sessions, Unit Group sessions and Medication administration.  Evaluation of Outcomes: Progressing  Physician Treatment Plan for Secondary Diagnosis: Principal Problem:   Schizophrenia ([place]) Active Problems:   Cannabis use disorder   Tobacco use disorder  Long Term Goal(s): Improvement in symptoms so as ready for discharge   Short Term Goals: Ability to identify changes in lifestyle to reduce recurrence of condition will improve Ability to verbalize feelings will improve     Medication Management: Evaluate patient's response, side effects, and tolerance of medication regimen.  Therapeutic Interventions: 1 to 1 sessions, Unit Group sessions and Medication administration.  Evaluation of  Outcomes: Progressing   RN Treatment Plan for Primary Diagnosis: Schizophrenia Long Term Goal(s): Knowledge of disease and therapeutic regimen to maintain health will improve  Short Term Goals: Ability to remain free from injury will improve, Ability to verbalize frustration and anger appropriately will improve, Ability to demonstrate self-control, Ability to participate in decision making will improve, Ability to verbalize feelings will improve, Ability to disclose and discuss suicidal ideas, Ability to identify and develop effective coping behaviors will improve, and Compliance with prescribed medications will improve  Medication Management: RN will administer medications as ordered by provider, will assess and evaluate patient's response and provide education to patient for prescribed medication. RN will report any adverse and/or side effects to prescribing provider.  Therapeutic Interventions: 1 on 1 counseling sessions, Psychoeducation, Medication administration, Evaluate responses to treatment, Monitor vital signs and CBGs as ordered, Perform/monitor CIWA, COWS, AIMS and Fall Risk screenings as ordered, Perform wound care treatments as ordered.  Evaluation of Outcomes: Progressing   LCSW Treatment Plan for Primary Diagnosis: Schizophrenia Long Term Goal(s): Safe transition to appropriate next level of care at discharge, Engage patient in therapeutic group addressing interpersonal concerns.  Short Term Goals: Engage patient in aftercare planning with referrals and resources, Increase social support, Increase ability to appropriately verbalize feelings, Increase emotional regulation, Facilitate acceptance of mental health diagnosis and concerns, Facilitate patient progression through stages of change regarding substance use diagnoses and concerns, Identify triggers associated with mental health/substance abuse issues, and Increase skills for wellness and recovery  Therapeutic Interventions:  Assess for all discharge needs, 1 to 1 time with Social worker, Explore available resources and support systems, Assess for adequacy in community support network, Educate family and significant other(s) on suicide prevention, Complete Psychosocial Assessment, Interpersonal group therapy.  Evaluation of Outcomes: Progressing   Progress in Treatment: Attending groups: Yes. Participating in groups: Yes. Taking medication as prescribed: Yes. Toleration medication: Yes. Family/Significant other contact made: Yes, individual(s) contacted:  Butch Penny (mother) [phone] Patient understands diagnosis: Yes. Discussing patient identified problems/goals with staff: Yes. Medical problems stabilized or resolved: Yes. Denies suicidal/homicidal ideation: Yes. Issues/concerns per patient self-inventory: Yes. Other:   New problem(s) identified: No, Describe:  None Reported  New Short Term/Long Term Goal(s):medication stabilization, elimination of SI thoughts, development of comprehensive mental wellness plan.   Patient Goals:  Medication Stabilization  Discharge Plan or Barriers: CSW will continue to follow and assess for appropriate referrals and possible discharge planning.   Reason for Continuation of Hospitalization: Delusions  Hallucinations Medication stabilization Withdrawal symptoms  Estimated Length of Stay: 3-7 Days  Last [place] Suicide Severity Risk Score: Flowsheet Row Admission (Current) from 05/30/2022 in [place] [facility] ED from 05/29/2022 in [facility] Department at [facility] ED from 06/06/2021 in [facility] Emergency Department at [place] No Risk  No Risk No Risk       Last PHQ 2/9 Scores:    02/19/2017   10:11 AM  Depression screen PHQ 2/9  Decreased Interest 0  Down, Depressed, Hopeless 0  PHQ - 2 Score 0  Altered sleeping 1  Tired, decreased energy 0  Change in appetite 0  Feeling  bad or failure about yourself  0  Trouble concentrating 0  Moving slowly or fidgety/restless 0  Suicidal thoughts 0  PHQ-9 Score 1   detox, medication management for mood stabilization; elimination of SI thoughts; development of comprehensive mental wellness/sobriety plan     Scribe for Treatment Team: Windle Guard, LCSW 06/01/2022 12:17 PM

## 2022-06-01 NOTE — Progress Notes (Signed)
   06/01/22 0622  15 Minute Checks  Location Hallway  Visual Appearance Calm  Behavior Composed  Sleep (Behavioral Health Patients Only)  Calculate sleep? (Click Yes once per 24 hr at 0600 safety check) Yes  Documented sleep last 24 hours 9

## 2022-06-01 NOTE — Group Note (Deleted)
Recreation Therapy Group Note   Group Topic:Stress Management  Group Date: 06/01/2022 Start Time: 0930 End Time: 0955 Facilitators: Shermaine Rivet-McCall, Denese Killings, NT Location: [address] Dayroom       Affect/Mood: {RT BHH Affect/Mood:26271}   Participation Level: {RT BHH Participation [name]   Participation Quality: {RT BHH Participation Quality:26268}   Behavior: {RT BHH Group Behavior:26269}   Speech/Thought Process: {RT BHH Speech/Thought:26276}   Insight: {RT BHH Insight:26272}   Judgement: {RT BHH Judgement:26278}   Modes of Intervention: {RT BHH Modes of Intervention:26277}   Patient Response to Interventions:  {RT BHH Patient Response to Intervention:26274}   Education Outcome:  {RT [place] Education Outcome:26279}   Clinical Observations/Individualized Feedback: *** was *** in their participation of session activities and group discussion. Pt identified ***   Plan: {RT BHH Tx Plan:26280}   Gilbert Meyers A Gilbert Meyers, NT,  06/01/2022 12:22 PM

## 2022-06-01 NOTE — BHH Group Notes (Signed)
Adult Psychoeducational Group Note  Date:  06/01/2022 Time:  11:53 AM  Group Topic/Focus:  Goals Group:   The focus of this group is to help patients establish daily goals to achieve during treatment and discuss how the patient can incorporate goal setting into their daily lives to aide in recovery.  Participation Level:  Did Not Attend  Additional Comments:  Pt did not attend group after being encouraged to go.   Victorino December 06/01/2022, 11:53 AM

## 2022-06-02 DIAGNOSIS — F209 Schizophrenia, unspecified: Secondary | ICD-10-CM

## 2022-06-02 NOTE — BHH Group Notes (Signed)
Pt did not attend goals group. 

## 2022-06-02 NOTE — Progress Notes (Signed)
D. Pt presented anxious with poor eye contact, but friendly upon initial approach. Pt currently denies SI/HI and AVH  A. Labs and vitals monitored. Pt received risperidone ER 50 mg. subq  . Pt supported emotionally and encouraged to express concerns and ask questions.   R. Pt remains safe with 15 minute checks. Will continue POC.

## 2022-06-02 NOTE — BHH Group Notes (Signed)
[place] Group Notes:  (Nursing/MHT/Case Management/Adjunct)  Date:  06/02/2022  Time:  9:18 PM  Type of Therapy:  Group Therapy  Participation Level:  Active  Participation Quality:  Appropriate  Affect:  Appropriate  Cognitive:  Appropriate  Insight:  Appropriate  Engagement in Group:  Engaged  Modes of Intervention:  Education  Summary of Progress/Problems: Goal to work on Radiographer, therapeutic. Day 8/10. Participated in anger management exercise.   Orvan Falconer 06/02/2022, 9:18 PM

## 2022-06-02 NOTE — Progress Notes (Signed)
   06/02/22 0000  Psych Admission Type (Psych Patients Only)  Admission Status Voluntary  Psychosocial Assessment  Patient Complaints Anxiety  Eye Contact Fair  Facial Expression Animated  Affect Appropriate to circumstance  Speech Logical/coherent  Interaction Assertive  Motor Activity Rigidity  Appearance/Hygiene Unremarkable  Behavior Characteristics Cooperative;Appropriate to situation  Mood Pleasant  Thought Process  Coherency Circumstantial  Content WDL  Delusions None reported or observed  Perception WDL  Hallucination None reported or observed  Judgment Impaired  Confusion None  Danger to Self  Current suicidal ideation? Denies  Danger to Others  Danger to Others None reported or observed  Danger to Others Abnormal  Harmful Behavior to others No threats or harm toward other people  Destructive Behavior No threats or harm toward property

## 2022-06-02 NOTE — Progress Notes (Signed)
[facility] MD Progress Note  06/02/2022 4:39 PM Gilbert Meyers  MRN:  [id number]  Subjective:   HPI: Gilbert Meyers is a 37 yo male with a PPHx of schizophrenia,GAD, and cannabis use disorder with 4 prior inpatient hospitalizations, presenting voluntarily to [facility] for worsening agitation, paranoia, and psychosis in the setting of medication nonadherence and cannabis use. On admission pt restarted on Risperdal and increased dose to 1 mg twice daily.    24 hour chart review: Staff report patient has been in his room majority of the shift not attending any unit groups or activities; was heard overnight talking to himself and observed responding to external/internal stimuli. UDS+THC. Patient received Risperdal Uzedy 50 mg IM LAI today with plan to discharge over next few days. He remains medication compliant with PRN Acetaminophen 650 mg given for mild pain, PRN Trazodone 50 mg given last night for insomnia.    Assessment: Patient was assessed in his room where he presented laying in bed. He responded to his name and rolled over but kept his eyes closed throughout the assessment. He was oriented to person, place, time; partial to situation. Calm and cooperative. Euthymic mood, withdrawn, guarded affect. He describes his mood as 'pretty good' says he changed from 500 hall to his current room on yesterday. Reports reason for admission as 'I got arrested by the sherrif. My dad called them. I was mad I guess. I little angry'. He reports attending groups despite staff reporting him remaining in his room this shift. States he currently feels safe on unit. Denies any SI/HI/AVH or ideas of reference despite staff report. Minimal insight noted throughout assessment. Contracts for safety.   Principal Problem: Schizophrenia Diagnosis: Principal Problem:   Schizophrenia ([place]) Active Problems:   Cannabis use disorder   Tobacco use disorder  Total Time spent with patient: 20 minutes  Past Psychiatric History:  Current  Psychiatrist: DR. Hoyle Barr at [facility], last visit 1 month ago Current Therapist: Denies Previous Psych Diagnoses: Schizophrenia, GAD Current psychiatric medications: Risperdal 1 mg every 12 hours, Cogentin 0.5 mg twice daily Psychiatric medication history: Trazodone-reports currently not taking for sleep Cymbalta-reports it has made him "violently suicidal" in the past Seroquel-reports it is too sedating Prior inpatient treatment: 4 prior hospitalizations Found on chart review: 03/24/2015-03/29/2015 (5 days) at [facility] Admitted for worsening psychosis in the context of medication noncompliance Started on Risperdal with plan to bridge to LAI 06/03/2017- 06/10/2017 (7 days) at [facility] "Worsening symptoms of psychosis including AH, poor intake of food and water for the past 5 weeks, anxiety, and somatic delusions regarding his jaw, throat and arm. " "3 previous inpatient stays with last being in 2017 to [place]" History of suicide: as described in the HPI History of homicide or aggression: as described in the HPI Psychiatric medication compliance history: As described in HPI   Past Medical History:  Past Medical History:  Diagnosis Date   Anxiety    Bipolar 1 disorder ([place])    Schizophrenia ([place])     Past Surgical History:  Procedure Laterality Date   CHOLECYSTECTOMY N/A 02/02/2019   Procedure: LAPAROSCOPIC CHOLECYSTECTOMY;  Surgeon: Virl Cagey, MD;  Location: [facility];  Service: General;  Laterality: N/A;   Family History:  Family History  Problem Relation Age of Onset   Prostate cancer Paternal Grandfather    Colon cancer Neg Hx    Family Psychiatric  History: See H&P  Social History:  Social History   Substance and Sexual Activity  Alcohol Use No  Comment: former     Social History   Substance and Sexual Activity  Drug Use Yes   Types: Marijuana    Social History   Socioeconomic History   Marital status: Single    Spouse name: Not on file    Number of children: 0   Years of education: Not on file   Highest education level: Not on file  Occupational History   Occupation: Unemployed   Tobacco Use   Smoking status: Former    Packs/day: .1    Types: E-cigarettes, Cigarettes   Smokeless tobacco: Never  Vaping Use   Vaping Use: Former  Substance and Sexual Activity   Alcohol use: No    Comment: former   Drug use: Yes    Types: Marijuana   Sexual activity: Not Currently    Birth control/protection: Condom  Other Topics Concern   Not on file  Social History Narrative   Not on file   Social Determinants of Health   Financial Resource Strain: Not on file  Food Insecurity: No Food Insecurity (05/30/2022)   Hunger Vital Sign    Worried About Running Out of Food in the Last Year: Never true    Ran Out of Food in the Last Year: Never true  Transportation Needs: No Transportation Needs (05/30/2022)   PRAPARE - Hydrologist (Medical): No    Lack of Transportation (Non-Medical): No  Physical Activity: Not on file  Stress: Not on file  Social Connections: Not on file   Additional Social History:   Current Medications: Current Facility-Administered Medications  Medication Dose Route Frequency Provider Last Rate Last Admin   acetaminophen (TYLENOL) tablet 650 mg  650 mg Oral Q6H PRN Derrill Center, NP   650 mg at 06/02/22 0952   alum & mag hydroxide-simeth (MAALOX/MYLANTA) 200-200-20 MG/5ML suspension 30 mL  30 mL Oral Q4H PRN Derrill Center, NP       benztropine (COGENTIN) tablet 0.5 mg  0.5 mg Oral BID Derrill Center, NP   0.5 mg at 06/02/22 [id number]   haloperidol (HALDOL) tablet 5 mg  5 mg Oral TID PRN Janine Limbo, MD       And   LORazepam (ATIVAN) tablet 2 mg  2 mg Oral TID PRN Janine Limbo, MD       And   diphenhydrAMINE (BENADRYL) capsule 50 mg  50 mg Oral TID PRN Massengill, Ovid Curd, MD       haloperidol lactate (HALDOL) injection 5 mg  5 mg Intramuscular TID PRN Massengill,  Ovid Curd, MD       And   LORazepam (ATIVAN) injection 2 mg  2 mg Intramuscular TID PRN Massengill, Ovid Curd, MD       And   diphenhydrAMINE (BENADRYL) injection 50 mg  50 mg Intramuscular TID PRN Massengill, Ovid Curd, MD       hydrOXYzine (ATARAX) tablet 50 mg  50 mg Oral TID PRN Derrill Center, NP   50 mg at 06/02/22 0118   magnesium hydroxide (MILK OF MAGNESIA) suspension 30 mL  30 mL Oral Daily PRN Derrill Center, NP       nicotine (NICODERM CQ - dosed in mg/24 hours) patch 21 mg  21 mg Transdermal Daily Massengill, Ovid Curd, MD   21 mg at 06/02/22 0833   risperiDONE (RISPERDAL) tablet 1 mg  1 mg Oral Q12H Carrion-Carrero, Margely, MD   1 mg at 06/02/22 0833   traZODone (DESYREL) tablet 50 mg  50 mg Oral QHS  PRN Derrill Center, NP   50 mg at 06/02/22 0118    Lab Results:  Results for orders placed or performed during the hospital encounter of 05/30/22 (from the past 48 hour(s))  Lipid panel     Status: Abnormal   Collection Time: 06/01/22  6:27 AM  Result Value Ref Range   Cholesterol 187 0 - 200 mg/dL   Triglycerides 99 <150 mg/dL   HDL 57 >40 mg/dL   Total CHOL/HDL Ratio 3.3 RATIO   VLDL 20 0 - 40 mg/dL   LDL Cholesterol 110 (H) 0 - 99 mg/dL    Comment:        Total Cholesterol/HDL:CHD Risk Coronary Heart Disease Risk Table                     Men   Women  1/2 Average Risk   3.4   3.3  Average Risk       5.0   4.4  2 X Average Risk   9.6   7.1  3 X Average Risk  23.4   11.0        Use the calculated Patient Ratio above and the CHD Risk Table to determine the patient's CHD Risk.        ATP III CLASSIFICATION (LDL):  <100     mg/dL   Optimal  100-129  mg/dL   Near or Above                    Optimal  130-159  mg/dL   Borderline  160-189  mg/dL   High  >190     mg/dL   Very High Performed at [facility] [address] [place], [place] [address]   TSH     Status: None   Collection Time: 06/01/22  6:27 AM  Result Value Ref Range   TSH 4.244 0.350 -  4.500 uIU/mL    Comment: Performed by a 3rd Generation assay with a functional sensitivity of <=0.01 uIU/mL. Performed at [facility], [place] [address], [place], [place] [address]   Hemoglobin A1c     Status: None   Collection Time: 06/01/22  6:27 AM  Result Value Ref Range   Hgb A1c MFr Bld 5.5 4.8 - 5.6 %    Comment: (NOTE)         Prediabetes: 5.7 - 6.4         Diabetes: >6.4         Glycemic control for adults with diabetes: <7.0    Mean Plasma Glucose 111 mg/dL    Comment: (NOTE) Performed At: [facility] [place], [place] [id number] Rush Farmer MD [id number]     Blood Alcohol level:  Lab Results  Component Value Date   [facility] <10 05/29/2022   ETH <10 [id number]    Metabolic Disorder Labs: Lab Results  Component Value Date   HGBA1C 5.5 06/01/2022   MPG 111 06/01/2022   MPG 108.28 06/05/2017   Lab Results  Component Value Date   PROLACTIN 53.0 (H) 06/05/2017   PROLACTIN 22.9 (H) 03/25/2015   Lab Results  Component Value Date   CHOL 187 06/01/2022   TRIG 99 06/01/2022   HDL 57 06/01/2022   CHOLHDL 3.3 06/01/2022   VLDL 20 06/01/2022   LDLCALC 110 (H) 06/01/2022   LDLCALC 74 06/05/2017    Physical Findings: AIMS:  , ,  ,  ,    CIWA:  COWS:     Aims score zero on my exam. No eps on my exam.  Musculoskeletal: Strength & Muscle Tone: within normal limits Gait & Station: normal Patient leans: N/A  Psychiatric Specialty Exam:  Presentation  General Appearance:  Casual; Disheveled  Eye Contact: None  Speech: Normal Rate  Speech Volume: Normal  Handedness: Right  Mood and Affect  Mood: Dysphoric  Affect: Constricted  Thought Process  Thought Processes: Linear  Descriptions of Associations:Intact  Orientation:Full (Time, Place and Person)  Thought Content:Logical  History of Schizophrenia/Schizoaffective disorder:Yes  Duration of Psychotic Symptoms:No data recorded more than  months  Hallucinations:Hallucinations: None (pt denies)  Ideas of Reference:None Parents have concern for paranoia, but patient denies  Suicidal Thoughts:Suicidal Thoughts: No  Homicidal Thoughts:Homicidal Thoughts: No  Sensorium  Memory: Immediate Fair; Recent Fair  Judgment: Poor  Insight: Shallow  Executive Functions  Concentration: Poor  Attention Span: Poor  Recall: [employer] of Knowledge: Fair  Language: Fair  Psychomotor Activity  Psychomotor Activity: Psychomotor Activity: Normal  Assets  Assets: Resilience; Physical Health; Social Support  Sleep  Sleep: Sleep: Good  Physical Exam: Physical Exam Vitals reviewed.  Constitutional:      General: He is not in acute distress.    Appearance: He is normal weight. He is not toxic-appearing.  Pulmonary:     Effort: Pulmonary effort is normal.  Neurological:     Mental Status: He is alert.     Motor: No weakness.     Gait: Gait normal.  Psychiatric:        Attention and Perception: He does not perceive auditory or visual hallucinations.        Mood and Affect: Affect is flat and inappropriate.        Speech: Speech is delayed.        Behavior: Behavior is withdrawn.        Thought Content: Thought content does not include homicidal or suicidal ideation. Thought content does not include homicidal or suicidal plan.        Judgment: Judgment is impulsive and inappropriate.    Review of Systems  Constitutional:  Negative for chills and fever.  Cardiovascular:  Negative for chest pain and palpitations.  Neurological:  Negative for dizziness, tingling, tremors and headaches.  Psychiatric/Behavioral:  Negative for depression and suicidal ideas. The patient is nervous/anxious.   All other systems reviewed and are negative.  Blood pressure 100/72, pulse 74, temperature (!) 97.4 F (36.3 C), temperature source Oral, resp. rate 16, height 5\' 10"  (1.778 m), weight 79.8 kg, SpO2 97 %. Body mass index is  25.25 kg/m.   Treatment Plan Summary: Daily contact with patient to assess and evaluate symptoms and progress in treatment and Medication management   Diagnoses / Active Problems: Schizophrenia Cannabis use disorder Tobacco use disorder     PLAN: Safety and Monitoring:             -- VOLUNTARY admission to inpatient psychiatric unit for safety, stabilization and  treatment             -- Daily contact with patient to assess and evaluate symptoms and progress in treatment             -- Patient's case to be discussed in multi-disciplinary team meeting             -- Observation Level : q15 minute checks             -- Vital signs:  q12 hours             --  Precautions: suicide, elopement, and assault   2. Psychiatric Diagnoses and Treatment:  Continue:  Risperdal 1 mg Q12Hrs for paranoia and psychosis.  Recommend decreasing the oral dose of Risperdal over the weekend, after patient receives the Risperdal LAI Risperdal Uzedy 50 mg IM LAI (2 mg conversion to 1 month) for schizophrenia.  Patient decompensation in context of medication noncompliance and also substance use. Given 06/02/22.  Cogentin 0.5 mg BID for EPS ppx Plan is to taper down and d/c this medication during his hospitalization   -- The risks/benefits/side-effects/alternatives to this medication were discussed in detail with the patient and time was given for questions. The patient consents to medication trial.              -- Metabolic profile and EKG monitoring obtained while on an atypical antipsychotic  BMI: 25.25 kg/m TSH: 4.244 Lipid Panel: LDL 110; otherwise WNL HbgA1c: 5.5 QTc: 396             -- Encouraged patient to participate in unit milieu and in scheduled group therapies              -- Short Term Goals: Ability to identify changes in lifestyle to reduce recurrence of  condition will improve and Ability to verbalize feelings will improve             -- Long Term Goals: Improvement in symptoms so as  ready for discharge                3. Medical Issues Being Addressed:    Tobacco Use Disorder Nicotine patch 21mg /24 hours ordered Smoking cessation encouraged   4. Discharge Planning:              -- Social work and case management to assist with discharge planning and identification  of hospital follow-up needs prior to discharge             -- Estimated LOS: 3-4 more days              -- Discharge Concerns: Need to establish a safety plan; Medication compliance and  effectiveness             -- Discharge Goals: Return home with outpatient referrals for mental health follow-up  including medication management/psychotherapy    Inda Merlin, NP 06/02/2022, 4:39 PM   Psychiatrist Patient ID: Leia Alf, male   DOB: 04/04/1985, 37 y.o.   MRN: [id number]

## 2022-06-02 NOTE — [facility] Group Notes (Signed)
[facility] Group Notes:  (Nursing)  Date:  06/02/2022  Time:  400PM  Type of Therapy:  Nurse Education  Participation Level:  Did Not Attend   Gilbert Meyers 06/02/2022, 7:05 PM

## 2022-06-03 DIAGNOSIS — F203 Undifferentiated schizophrenia: Secondary | ICD-10-CM | POA: Diagnosis not present

## 2022-06-03 NOTE — Progress Notes (Signed)
D. Pt presents with a brighter affect and mood today- observed attending groups- Pt currently denies SI/HI and AVH and does not appear to be responding to internal stimuli. A. Labs and vitals monitored. Pt given and educated on medications. Pt supported emotionally and encouraged to express concerns and ask questions.   R. Pt remains safe with 15 minute checks. Will continue POC.

## 2022-06-03 NOTE — Progress Notes (Signed)
   06/02/22 2300  Psych Admission Type (Psych Patients Only)  Admission Status Voluntary  Psychosocial Assessment  Patient Complaints Anxiety  Eye Contact Fair  Facial Expression Animated  Affect Appropriate to circumstance  Speech Logical/coherent  Interaction Assertive  Motor Activity Rigidity  Appearance/Hygiene Unremarkable  Behavior Characteristics Cooperative;Appropriate to situation  Mood Pleasant  Thought Process  Coherency Circumstantial  Content WDL  Delusions None reported or observed  Perception WDL  Hallucination None reported or observed  Judgment Impaired  Confusion None  Danger to Self  Current suicidal ideation? Denies  Danger to Others  Danger to Others None reported or observed  Danger to Others Abnormal  Harmful Behavior to others No threats or harm toward other people  Destructive Behavior No threats or harm toward property

## 2022-06-03 NOTE — Progress Notes (Signed)
[facility] MD Progress Note  06/03/2022 11:40 PM Gilbert Meyers  MRN:  [id number] Subjective:    Gilbert Meyers is a 37 year old, Caucasian male with a past psychiatric history significant for schizophrenia who was voluntarily admitted to [facility] for worsening agitation, paranoia, and psychosis in the setting of medication nonadherence and cannabis use.  Patient was assessed by Ileene Musa, PA-C for reevaluation.  Patient is currently being managed on the following psychiatric medications:  Patient received Uzedy 50 mg long-acting injectable on 06/02/2022 Trazodone 50 mg at bedtime as needed Risperdal 1 mg every 12 hours Cogentin 0.5 mg 2 times daily as needed  Agitation protocol -Haloperidol 5 mg, p.o. or injection, 3 times daily as needed for agitation AND -Lorazepam 2 mg, p.o. or injection, 3 times daily as needed for agitation AND -Diphenhydramine 50 mg, p.o. or injection, 3 times daily as needed for agitation  Patient reports that he is doing a lot better since receiving his Uzedy injection.  Patient denies experiencing any adverse side effects from his current medication regimen.  Patient states that today is the best he has felt in a long time since waking up.  Patient continues to endorse some depression and rates his depression as 6 out of 10 with 10 being most severe.  Patient denies anxiety.  Patient denies any new stressors at this time.  Patient is alert and oriented x 4, calm, cooperative, and fully engaged in conversation during the encounter.  Patient exhibits good eye contact.  Patient's speech is clear, coherent, and with normal rate.  Patient's thought process is coherent.  Patient's thought content is logical.  Patient endorses some depression with congruent affect.  Patient denies suicidal or homicidal ideations.  He further denies auditory or visual hallucinations and does not appear to be responding to internal/external stimuli.  Patient denies paranoia or  delusional thoughts.  Patient endorses good sleep and receives on average 8 hours of sleep each night.  Patient endorses good appetite and eats on average 3 meals per day.  Principal Problem: Schizophrenia Diagnosis: Principal Problem:   Schizophrenia ([place]) Active Problems:   Cannabis use disorder   Tobacco use disorder  Total Time spent with patient: 15 minutes  Past Psychiatric History:  Schizophrenia Cannabis use disorder Tobacco use disorder  Past Medical History:  Past Medical History:  Diagnosis Date   Anxiety    Bipolar 1 disorder ([place])    Schizophrenia ([place])     Past Surgical History:  Procedure Laterality Date   CHOLECYSTECTOMY N/A 02/02/2019   Procedure: LAPAROSCOPIC CHOLECYSTECTOMY;  Surgeon: Virl Cagey, MD;  Location: [facility];  Service: General;  Laterality: N/A;   Family History:  Family History  Problem Relation Age of Onset   Prostate cancer Paternal Grandfather    Colon cancer Neg Hx    Family Psychiatric  History:  See H&P  Social History:  Social History   Substance and Sexual Activity  Alcohol Use No   Comment: former     Social History   Substance and Sexual Activity  Drug Use Yes   Types: Marijuana    Social History   Socioeconomic History   Marital status: Single    Spouse name: Not on file   Number of children: 0   Years of education: Not on file   Highest education level: Not on file  Occupational History   Occupation: Unemployed   Tobacco Use   Smoking status: Former    Packs/day: .1    Types: E-cigarettes,  Cigarettes   Smokeless tobacco: Never  Vaping Use   Vaping Use: Former  Substance and Sexual Activity   Alcohol use: No    Comment: former   Drug use: Yes    Types: Marijuana   Sexual activity: Not Currently    Birth control/protection: Condom  Other Topics Concern   Not on file  Social History Narrative   Not on file   Social Determinants of Health   Financial Resource Strain: Not on file  Food  Insecurity: No Food Insecurity (05/30/2022)   Hunger Vital Sign    Worried About Running Out of Food in the Last Year: Never true    Ran Out of Food in the Last Year: Never true  Transportation Needs: No Transportation Needs (05/30/2022)   PRAPARE - Hydrologist (Medical): No    Lack of Transportation (Non-Medical): No  Physical Activity: Not on file  Stress: Not on file  Social Connections: Not on file   Additional Social History:  See H&P  Sleep: Good  Appetite:  Good  Current Medications: Current Facility-Administered Medications  Medication Dose Route Frequency Provider Last Rate Last Admin   acetaminophen (TYLENOL) tablet 650 mg  650 mg Oral Q6H PRN Derrill Center, NP   650 mg at 06/03/22 1452   alum & mag hydroxide-simeth (MAALOX/MYLANTA) 200-200-20 MG/5ML suspension 30 mL  30 mL Oral Q4H PRN Derrill Center, NP       benztropine (COGENTIN) tablet 0.5 mg  0.5 mg Oral BID Derrill Center, NP   0.5 mg at 06/03/22 2053   haloperidol (HALDOL) tablet 5 mg  5 mg Oral TID PRN Janine Limbo, MD       And   LORazepam (ATIVAN) tablet 2 mg  2 mg Oral TID PRN Janine Limbo, MD       And   diphenhydrAMINE (BENADRYL) capsule 50 mg  50 mg Oral TID PRN Massengill, Ovid Curd, MD       haloperidol lactate (HALDOL) injection 5 mg  5 mg Intramuscular TID PRN Massengill, Ovid Curd, MD       And   LORazepam (ATIVAN) injection 2 mg  2 mg Intramuscular TID PRN Massengill, Ovid Curd, MD       And   diphenhydrAMINE (BENADRYL) injection 50 mg  50 mg Intramuscular TID PRN Massengill, Ovid Curd, MD       hydrOXYzine (ATARAX) tablet 50 mg  50 mg Oral TID PRN Derrill Center, NP   50 mg at 06/02/22 2108   magnesium hydroxide (MILK OF MAGNESIA) suspension 30 mL  30 mL Oral Daily PRN Derrill Center, NP       nicotine (NICODERM CQ - dosed in mg/24 hours) patch 21 mg  21 mg Transdermal Daily Massengill, Ovid Curd, MD   21 mg at 06/03/22 0806   risperiDONE (RISPERDAL) tablet 1 mg  1 mg  Oral Q12H Carrion-Carrero, Margely, MD   1 mg at 06/03/22 2053   traZODone (DESYREL) tablet 50 mg  50 mg Oral QHS PRN Derrill Center, NP   50 mg at 06/02/22 2108    Lab Results: No results found for this or any previous visit (from the past 59 hour(s)).  Blood Alcohol level:  Lab Results  Component Value Date   [facility] <10 05/29/2022   ETH <10 123XX123    Metabolic Disorder Labs: Lab Results  Component Value Date   HGBA1C 5.5 06/01/2022   MPG 111 06/01/2022   MPG 108.28 06/05/2017   Lab Results  Component Value Date   PROLACTIN 53.0 (H) 06/05/2017   PROLACTIN 22.9 (H) 03/25/2015   Lab Results  Component Value Date   CHOL 187 06/01/2022   TRIG 99 06/01/2022   HDL 57 06/01/2022   CHOLHDL 3.3 06/01/2022   VLDL 20 06/01/2022   LDLCALC 110 (H) 06/01/2022   LDLCALC 74 06/05/2017    Physical Findings: AIMS:  , ,  ,  ,    CIWA:    COWS:     Musculoskeletal: Strength & Muscle Tone: within normal limits Gait & Station: normal Patient leans: N/A  Psychiatric Specialty Exam:  Presentation  General Appearance:  Casual  Eye Contact: Good  Speech: Normal Rate; Clear and Coherent  Speech Volume: Normal  Handedness: Right   Mood and Affect  Mood: -- (Improving)  Affect: Congruent   Thought Process  Thought Processes: Coherent; Linear  Descriptions of Associations:Intact  Orientation:Full (Time, Place and Person)  Thought Content:Logical  History of Schizophrenia/Schizoaffective disorder:No  Duration of Psychotic Symptoms:No data recorded Hallucinations:Hallucinations: None  Ideas of Reference:None  Suicidal Thoughts:Suicidal Thoughts: No  Homicidal Thoughts:Homicidal Thoughts: No   Sensorium  Memory: Immediate Fair; Recent Fair  Judgment: Fair  Insight: Fair   Community education officer  Concentration: Fair  Attention Span: Fair  Recall: [place] of Knowledge: Fair  Language: Good   Psychomotor Activity  Psychomotor  Activity: Psychomotor Activity: Normal   Assets  Assets: Physical Health; Resilience; Social Support; Housing   Sleep  Sleep: Sleep: Good    Physical Exam: Physical Exam Constitutional:      Appearance: Normal appearance.  HENT:     Head: Normocephalic and atraumatic.     Nose: Nose normal.     Mouth/Throat:     Mouth: Mucous membranes are moist.  Eyes:     Extraocular Movements: Extraocular movements intact.  Cardiovascular:     Rate and Rhythm: Normal rate and regular rhythm.  Pulmonary:     Effort: Pulmonary effort is normal.  Abdominal:     General: Abdomen is flat.  Musculoskeletal:        General: Normal range of motion.     Cervical back: Normal range of motion and neck supple.  Skin:    General: Skin is warm and dry.  Neurological:     General: No focal deficit present.     Mental Status: He is alert and oriented to person, place, and time.  Psychiatric:        Attention and Perception: Attention and perception normal. He does not perceive auditory or visual hallucinations.        Mood and Affect: Mood is depressed.        Speech: Speech normal.        Behavior: Behavior normal. Behavior is cooperative.        Thought Content: Thought content normal. Thought content is not paranoid or delusional. Thought content does not include homicidal or suicidal ideation.        Cognition and Memory: Cognition and memory normal.        Judgment: Judgment normal.    Review of Systems  Constitutional: Negative.   HENT: Negative.    Eyes: Negative.   Respiratory: Negative.    Cardiovascular: Negative.   Gastrointestinal: Negative.   Skin: Negative.   Neurological: Negative.   Psychiatric/Behavioral:  Positive for depression. Negative for hallucinations, substance abuse and suicidal ideas. The patient is not nervous/anxious and does not have insomnia.    Blood pressure 128/69, pulse 76, temperature 97.7  F (36.5 C), temperature source Oral, resp. rate 18, height  5\' 10"  (1.778 m), weight 79.8 kg, SpO2 97 %. Body mass index is 25.25 kg/m.   Treatment Plan Summary:  Daily contact with patient to assess and evaluate symptoms and progress in treatment and Medication management  Plan:  Patient endorses improved mood since receiving his Uzedy LAI.  Patient denies experiencing any adverse side effects from his current medication regimen.  Although patient endorses improved mood he does endorse some depression.  Patient denies anxiety.  Patient endorses stability of his current medication regimen.  Patient to continue his medications as prescribed.  Safety and Monitoring: Voluntary admission to inpatient psychiatric unit for safety, stabilization and treatment Daily contact with patient to assess and evaluate symptoms and progress in treatment Patient's case to be discussed in multi-disciplinary team meeting Observation Level : q15 minute checks Vital signs: q12 hours Precautions: suicide, elopement, and assault   Diagnosis: Principal Problem:   Schizophrenia ([place]) Active Problems:   Cannabis use disorder   Tobacco use disorder  #Schizophrenia -Patient received Uzedy LAI on 06/02/2022 for psychosis and mood stability -Continue Risperdal 1 mg every 12 hours for paranoia and psychosis -Continue Cogentin 0.5 mg 2 times daily for extra pyramidal symptoms from antipsychotic use  #Tobacco use disorder -NicoDerm CQ 21 mg / 24 hours transdermal patch daily  Agitation protocol -Haloperidol 5 mg, p.o. or injection, 3 times daily as needed for agitation AND -Lorazepam 2 mg, p.o. or injection, 3 times daily as needed for agitation AND -Diphenhydramine 50 mg, p.o. or injection, 3 times daily as needed for agitation  As needed medications: -Patient to continue taking Tylenol 650 mg every 6 hours as needed for mild pain -Patient to continue taking Maalox/Mylanta 30 mL every 4 hours as needed for indigestion -Patient to continue taking Milk of Magnesia 30 mL  as needed for mild constipation -Hydroxyzine 50 mg 3 times daily as needed for anxiety -Trazodone 50 mg at bedtime as needed for insomnia   Discharge Planning: Social work and case management to assist with discharge planning and identification of hospital follow-up needs prior to discharge Estimated LOS: 5-7 days Discharge Concerns: Need to establish a safety plan; Medication compliance and effectiveness Discharge Goals: Return home with outpatient referrals for mental health follow-up including medication management/psychotherapy  I certify that inpatient services furnished can reasonably be expected to improve the patient's condition.   Malachy Mood, PA 06/03/2022, 11:40 PM

## 2022-06-03 NOTE — Progress Notes (Incomplete)
[facility] MD Progress Note  06/03/2022 11:40 PM Gilbert Meyers  MRN:  [id number] Subjective:    Gilbert Meyers is a 37 year old, Caucasian male with a past psychiatric history significant for schizophrenia who was voluntarily admitted to [facility] for worsening agitation, paranoia, and psychosis in the setting of medication nonadherence and cannabis use.  Patient was assessed by Ileene Musa, PA-C for reevaluation.  Patient is currently being managed on the following psychiatric medications:  Patient received Uzedy 50 mg long-acting injectable on 06/02/2022 Trazodone 50 mg at bedtime as needed Risperdal 1 mg every 12 hours Cogentin 0.5 mg 2 times daily as needed  Agitation protocol -Haloperidol 5 mg, p.o. or injection, 3 times daily as needed for agitation AND -Lorazepam 2 mg, p.o. or injection, 3 times daily as needed for agitation AND -Diphenhydramine 50 mg, p.o. or injection, 3 times daily as needed for agitation  Patient reports that he is doing a lot better since receiving his Uzedy injection.  Patient denies experiencing any adverse side effects from his current medication regimen.  Patient states that today is the best he has felt in a long time since waking up.  Patient continues to endorse some depression and rates his depression as 6 out of 10 with 10 being most severe.  Patient denies anxiety.  Patient denies any new stressors at this time.  Patient is alert and oriented x 4, calm, cooperative, and fully engaged in conversation during the encounter.  Patient exhibits good eye contact.  Patient's speech is clear, coherent, and with normal rate.  Patient's thought process is coherent.  Patient's thought content is logical.  Patient endorses some depression with congruent affect.  Patient denies suicidal or homicidal ideations.  He further denies auditory or visual hallucinations and does not appear to be responding to internal/external stimuli.  Patient denies paranoia or  delusional thoughts.  Patient endorses good sleep and receives on average 8 hours of sleep each night.  Patient endorses good appetite and eats on average 3 meals per day.  Principal Problem: Schizophrenia Diagnosis: Principal Problem:   Schizophrenia ([place]) Active Problems:   Cannabis use disorder   Tobacco use disorder  Total Time spent with patient: 15 minutes  Past Psychiatric History:  Schizophrenia Cannabis use disorder Tobacco use disorder  Past Medical History:  Past Medical History:  Diagnosis Date  . Anxiety   . Bipolar 1 disorder ([place])   . Schizophrenia [facility])     Past Surgical History:  Procedure Laterality Date  . CHOLECYSTECTOMY N/A 02/02/2019   Procedure: LAPAROSCOPIC CHOLECYSTECTOMY;  Surgeon: Virl Cagey, MD;  Location: [facility];  Service: General;  Laterality: N/A;   Family History:  Family History  Problem Relation Age of Onset  . Prostate cancer Paternal Grandfather   . Colon cancer Neg Hx    Family Psychiatric  History:  See H&P  Social History:  Social History   Substance and Sexual Activity  Alcohol Use No   Comment: former     Social History   Substance and Sexual Activity  Drug Use Yes  . Types: Marijuana    Social History   Socioeconomic History  . Marital status: Single    Spouse name: Not on file  . Number of children: 0  . Years of education: Not on file  . Highest education level: Not on file  Occupational History  . Occupation: Unemployed   Tobacco Use  . Smoking status: Former    Packs/day: .1    Types: E-cigarettes,  Cigarettes  . Smokeless tobacco: Never  Vaping Use  . Vaping Use: Former  Substance and Sexual Activity  . Alcohol use: No    Comment: former  . Drug use: Yes    Types: Marijuana  . Sexual activity: Not Currently    Birth control/protection: Condom  Other Topics Concern  . Not on file  Social History Narrative  . Not on file   Social Determinants of Health   Financial Resource Strain: Not  on file  Food Insecurity: No Food Insecurity (05/30/2022)   Hunger Vital Sign   . Worried About Charity fundraiser in the Last Year: Never true   . Ran Out of Food in the Last Year: Never true  Transportation Needs: No Transportation Needs (05/30/2022)   PRAPARE - Transportation   . Lack of Transportation (Medical): No   . Lack of Transportation (Non-Medical): No  Physical Activity: Not on file  Stress: Not on file  Social Connections: Not on file   Additional Social History:  See H&P  Sleep: Good  Appetite:  Good  Current Medications: Current Facility-Administered Medications  Medication Dose Route Frequency Provider Last Rate Last Admin  . acetaminophen (TYLENOL) tablet 650 mg  650 mg Oral Q6H PRN Derrill Center, NP   650 mg at 06/03/22 1452  . alum & mag hydroxide-simeth (MAALOX/MYLANTA) 200-200-20 MG/5ML suspension 30 mL  30 mL Oral Q4H PRN Derrill Center, NP      . benztropine (COGENTIN) tablet 0.5 mg  0.5 mg Oral BID Derrill Center, NP   0.5 mg at 06/03/22 2053  . haloperidol (HALDOL) tablet 5 mg  5 mg Oral TID PRN Janine Limbo, MD       And  . LORazepam (ATIVAN) tablet 2 mg  2 mg Oral TID PRN Massengill, Ovid Curd, MD       And  . diphenhydrAMINE (BENADRYL) capsule 50 mg  50 mg Oral TID PRN Massengill, Ovid Curd, MD      . haloperidol lactate (HALDOL) injection 5 mg  5 mg Intramuscular TID PRN Janine Limbo, MD       And  . LORazepam (ATIVAN) injection 2 mg  2 mg Intramuscular TID PRN Massengill, Ovid Curd, MD       And  . diphenhydrAMINE (BENADRYL) injection 50 mg  50 mg Intramuscular TID PRN Massengill, Ovid Curd, MD      . hydrOXYzine (ATARAX) tablet 50 mg  50 mg Oral TID PRN Derrill Center, NP   50 mg at 06/02/22 2108  . magnesium hydroxide (MILK OF MAGNESIA) suspension 30 mL  30 mL Oral Daily PRN Derrill Center, NP      . nicotine (NICODERM CQ - dosed in mg/24 hours) patch 21 mg  21 mg Transdermal Daily Massengill, Ovid Curd, MD   21 mg at 06/03/22 0806  .  risperiDONE (RISPERDAL) tablet 1 mg  1 mg Oral Q12H Carrion-Carrero, Margely, MD   1 mg at 06/03/22 2053  . traZODone (DESYREL) tablet 50 mg  50 mg Oral QHS PRN Derrill Center, NP   50 mg at 06/02/22 2108    Lab Results: No results found for this or any previous visit (from the past 48 hour(s)).  Blood Alcohol level:  Lab Results  Component Value Date   [facility] <10 05/29/2022   ETH <10 123XX123    Metabolic Disorder Labs: Lab Results  Component Value Date   HGBA1C 5.5 06/01/2022   MPG 111 06/01/2022   MPG 108.28 06/05/2017   Lab Results  Component Value Date   PROLACTIN 53.0 (H) 06/05/2017   PROLACTIN 22.9 (H) 03/25/2015   Lab Results  Component Value Date   CHOL 187 06/01/2022   TRIG 99 06/01/2022   HDL 57 06/01/2022   CHOLHDL 3.3 06/01/2022   VLDL 20 06/01/2022   LDLCALC 110 (H) 06/01/2022   LDLCALC 74 06/05/2017    Physical Findings: AIMS:  , ,  ,  ,    CIWA:    COWS:     Musculoskeletal: Strength & Muscle Tone: within normal limits Gait & Station: normal Patient leans: N/A  Psychiatric Specialty Exam:  Presentation  General Appearance:  Casual  Eye Contact: Good  Speech: Normal Rate; Clear and Coherent  Speech Volume: Normal  Handedness: Right   Mood and Affect  Mood: -- (Improving)  Affect: Congruent   Thought Process  Thought Processes: Coherent; Linear  Descriptions of Associations:Intact  Orientation:Full (Time, Place and Person)  Thought Content:Logical  History of Schizophrenia/Schizoaffective disorder:No  Duration of Psychotic Symptoms:No data recorded Hallucinations:Hallucinations: None  Ideas of Reference:None  Suicidal Thoughts:Suicidal Thoughts: No  Homicidal Thoughts:Homicidal Thoughts: No   Sensorium  Memory: Immediate Fair; Recent Fair  Judgment: Fair  Insight: Fair   Community education officer  Concentration: Fair  Attention Span: Fair  Recall: [place] of  Knowledge: Fair  Language: Good   Psychomotor Activity  Psychomotor Activity: Psychomotor Activity: Normal   Assets  Assets: Physical Health; Resilience; Social Support; Housing   Sleep  Sleep: Sleep: Good    Physical Exam: Physical Exam Constitutional:      Appearance: Normal appearance.  HENT:     Head: Normocephalic and atraumatic.     Nose: Nose normal.     Mouth/Throat:     Mouth: Mucous membranes are moist.  Eyes:     Extraocular Movements: Extraocular movements intact.  Cardiovascular:     Rate and Rhythm: Normal rate and regular rhythm.  Pulmonary:     Effort: Pulmonary effort is normal.  Abdominal:     General: Abdomen is flat.  Musculoskeletal:        General: Normal range of motion.     Cervical back: Normal range of motion and neck supple.  Skin:    General: Skin is warm and dry.  Neurological:     General: No focal deficit present.     Mental Status: He is alert and oriented to person, place, and time.  Psychiatric:        Attention and Perception: Attention and perception normal. He does not perceive auditory or visual hallucinations.        Mood and Affect: Mood is depressed.        Speech: Speech normal.        Behavior: Behavior normal. Behavior is cooperative.        Thought Content: Thought content normal. Thought content is not paranoid or delusional. Thought content does not include homicidal or suicidal ideation.        Cognition and Memory: Cognition and memory normal.        Judgment: Judgment normal.    Review of Systems  Constitutional: Negative.   HENT: Negative.    Eyes: Negative.   Respiratory: Negative.    Cardiovascular: Negative.   Gastrointestinal: Negative.   Skin: Negative.   Neurological: Negative.   Psychiatric/Behavioral:  Positive for depression. Negative for hallucinations, substance abuse and suicidal ideas. The patient is not nervous/anxious and does not have insomnia.    Blood pressure 128/69, pulse 76,  temperature  97.7 F (36.5 C), temperature source Oral, resp. rate 18, height 5\' 10"  (1.778 m), weight 79.8 kg, SpO2 97 %. Body mass index is 25.25 kg/m.   Treatment Plan Summary: *** Daily contact with patient to assess and evaluate symptoms and progress in treatment and Medication management  Plan:  Patient endorses improved mood since receiving his Uzedy LAI.  Patient denies experiencing any adverse side effects from his current medication regimen.  Although patient endorses improved mood he does endorse some depression.  Patient denies anxiety.  Patient endorses stability of his current medication regimen.  Patient to continue his medications as prescribed.  Safety and Monitoring: Voluntary admission to inpatient psychiatric unit for safety, stabilization and treatment Daily contact with patient to assess and evaluate symptoms and progress in treatment Patient's case to be discussed in multi-disciplinary team meeting Observation Level : q15 minute checks Vital signs: q12 hours Precautions: suicide, elopement, and assault   Diagnosis: Principal Problem:   Schizophrenia ([place]) Active Problems:   Cannabis use disorder   Tobacco use disorder  #Schizophrenia -Patient received Uzedy LAI on 06/02/2022 for psychosis and mood stability -Continue Risperdal 1 mg every 12 hours for paranoia and psychosis -Continue Cogentin 0.5 mg 2 times daily for extra pyramidal symptoms from antipsychotic use  #Tobacco use disorder -  Agitation protocol -Haloperidol 5 mg, p.o. or injection, 3 times daily as needed for agitation AND -Lorazepam 2 mg, p.o. or injection, 3 times daily as needed for agitation AND -Diphenhydramine 50 mg, p.o. or injection, 3 times daily as needed for agitation  Malachy Mood, PA 06/03/2022, 11:40 PM

## 2022-06-03 NOTE — Progress Notes (Signed)
Adult Psychoeducational Group Note  Date:  06/03/2022 Time:  9:10 PM  Group Topic/Focus:  Wrap-Up Group:   The focus of this group is to help patients review their daily goal of treatment and discuss progress on daily workbooks.  Participation Level:  Active  Participation Quality:  Appropriate  Affect:  Appropriate  Cognitive:  Appropriate  Insight: Improving  Engagement in Group:  Engaged  Modes of Intervention:  Education  Additional Comments:  Pt attended the evening wrap-up group. Tech introduced the staff for the evening, reminded group of the evening schedule and reminded them to ask for anything they need. PT introduced themself to the group and shared a coping skill they use or have learned.  Amie Critchley 06/03/2022, 9:10 PM

## 2022-06-04 DIAGNOSIS — F209 Schizophrenia, unspecified: Secondary | ICD-10-CM | POA: Diagnosis not present

## 2022-06-04 MED ORDER — TRAZODONE HCL 50 MG PO TABS: 50.0000 mg | ORAL_TABLET | Freq: Every evening | ORAL | 0 refills | Status: AC | PRN

## 2022-06-04 MED ORDER — RISPERIDONE 1 MG PO TABS: 1.0000 mg | ORAL_TABLET | ORAL | 0 refills | Status: AC

## 2022-06-04 MED ORDER — HYDROXYZINE HCL 50 MG PO TABS: 50.0000 mg | ORAL_TABLET | Freq: Three times a day (TID) | ORAL | 0 refills | Status: AC | PRN

## 2022-06-04 MED ORDER — NICOTINE 21 MG/24HR TD PT24: 21.0000 mg | MEDICATED_PATCH | Freq: Every day | TRANSDERMAL | 0 refills | Status: AC

## 2022-06-04 MED ORDER — BENZTROPINE MESYLATE 0.5 MG PO TABS: 0.5000 mg | ORAL_TABLET | Freq: Two times a day (BID) | ORAL | 0 refills | Status: AC

## 2022-06-04 NOTE — Progress Notes (Signed)
   06/04/22 0615  15 Minute Checks  Location Bedroom  Visual Appearance Calm  Behavior Sleeping  Sleep (Behavioral Health Patients Only)  Calculate sleep? (Click Yes once per 24 hr at 0600 safety check) Yes  Documented sleep last 24 hours 7.75

## 2022-06-04 NOTE — Discharge Summary (Addendum)
Physician Discharge Summary Note  Patient:  Francis Krygowski is an 37 y.o., male MRN:  [id number] DOB:  24-Aug-1985 Patient phone:  [phone] (home)  Patient address:   [place] [address],  Total Time spent with patient: 15 minutes  Date of Admission:  05/30/2022 Date of Discharge: 06-04-2022  Reason for Admission:    Stran Becton is a 37 yo male with a PPHx of schizophrenia,GAD, and cannabis use disorder with 4 prior inpatient hospitalizations, presenting voluntarily to [facility] for worsening agitation, paranoia, and psychosis in the setting of medication nonadherence and cannabis use.   Principal Problem: Schizophrenia Discharge Diagnoses: Principal Problem:   Schizophrenia Active Problems:   Cannabis use disorder   Tobacco use disorder   Past Psychiatric History:  Current Psychiatrist: DR. Hoyle Barr at [facility], last visit 1 month ago Current Therapist: Denies Previous Psych Diagnoses: Schizophrenia, GAD Current psychiatric medications: Risperdal 1 mg every 12 hours, Cogentin 0.5 mg twice daily Psychiatric medication history: Trazodone-reports currently not taking for sleep Cymbalta-reports it has made him "violently suicidal" in the past Seroquel-reports it is too sedating Prior inpatient treatment: 4 prior hospitalizations Found on chart review: 03/24/2015-03/29/2015 (5 days) at [facility] Admitted for worsening psychosis in the context of medication noncompliance Started on Risperdal with plan to bridge to LAI 06/03/2017- 06/10/2017 (7 days) at [facility] "Worsening symptoms of psychosis including AH, poor intake of food and water for the past 5 weeks, anxiety, and somatic delusions regarding his jaw, throat and arm. " "3 previous inpatient stays with last being in 2017 to [place]" History of suicide: as described in the HPI History of homicide or aggression: as described in the HPI Psychiatric medication compliance history: As described in  HPI  Past Medical History:  Past Medical History:  Diagnosis Date   Anxiety    Bipolar 1 disorder ([place])    Schizophrenia ([place])     Past Surgical History:  Procedure Laterality Date   CHOLECYSTECTOMY N/A 02/02/2019   Procedure: LAPAROSCOPIC CHOLECYSTECTOMY;  Surgeon: Virl Cagey, MD;  Location: [facility];  Service: General;  Laterality: N/A;   Family History:  Family History  Problem Relation Age of Onset   Prostate cancer Paternal Grandfather    Colon cancer Neg Hx    Family Psychiatric  History:  Paternal grandfather- prostate cancer "3 maternal cousins have bipolar disorder, maternal cousin has history of overdose of methamphetamine, and maternal grandmother has history of unknown mental illness. "  Social History:  Social History   Substance and Sexual Activity  Alcohol Use No   Comment: former     Social History   Substance and Sexual Activity  Drug Use Yes   Types: Marijuana    Social History   Socioeconomic History   Marital status: Single    Spouse name: Not on file   Number of children: 0   Years of education: Not on file   Highest education level: Not on file  Occupational History   Occupation: Unemployed   Tobacco Use   Smoking status: Former    Packs/day: .1    Types: E-cigarettes, Cigarettes   Smokeless tobacco: Never  Vaping Use   Vaping Use: Former  Substance and Sexual Activity   Alcohol use: No    Comment: former   Drug use: Yes    Types: Marijuana   Sexual activity: Not Currently    Birth control/protection: Condom  Other Topics Concern   Not on file  Social History Narrative   Not on file  Social Determinants of Health   Financial Resource Strain: Not on file  Food Insecurity: No Food Insecurity (05/30/2022)   Hunger Vital Sign    Worried About Running Out of Food in the Last Year: Never true    Ran Out of Food in the Last Year: Never true  Transportation Needs: No Transportation Needs (05/30/2022)   PRAPARE -  Hydrologist (Medical): No    Lack of Transportation (Non-Medical): No  Physical Activity: Not on file  Stress: Not on file  Social Connections: Not on file    Hospital Course:   During the patient's hospitalization, patient had extensive initial psychiatric evaluation, and follow-up psychiatric evaluations every day.  Psychiatric diagnoses provided upon initial assessment: Schizophrenia Cannabis use disorder Tobacco use disorder  Patient's psychiatric medications were adjusted on admission:  Restart home Risperdal 1 mg Q12Hrs for paranoia and psychosis Ideally, transitioning to [place] would be appropriate given patient's hx of noncompliance. Restart home Cogentin 0.5 mg BID for EPS ppx  During the hospitalization, other adjustments were made to the patient's psychiatric medication regimen:  -Risperdal oral was incr to 1 mg bid -Risperdal Uzedy 50 mg LAI was given on 06-02-2022   Patient's care was discussed during the interdisciplinary team meeting every day during the hospitalization.  The patient denied having side effects to prescribed psychiatric medication. Aims score zero on my exam. No eps on my exam.  Gradually, patient started adjusting to milieu. The patient was evaluated each day by a clinical provider to ascertain response to treatment. Improvement was noted by the patient's report of decreasing symptoms, improved sleep and appetite, affect, medication tolerance, behavior, and participation in unit programming.  Patient was asked each day to complete a self inventory noting mood, mental status, pain, new symptoms, anxiety and concerns.    Symptoms were reported as significantly decreased or resolved completely by discharge.   On day of discharge, the patient reports that their mood is stable. The patient denied having suicidal thoughts for more than 48 hours prior to discharge.  Patient denies having homicidal thoughts.  Patient denies having  auditory hallucinations.  Patient denies any visual hallucinations or other symptoms of psychosis. The patient was motivated to continue taking medication with a goal of continued improvement in mental health.   The patient reports their target psychiatric symptoms of psychosis, paranoia, responded well to the psychiatric medications, and the patient reports overall benefit other psychiatric hospitalization. Supportive psychotherapy was provided to the patient. The patient also participated in regular group therapy while hospitalized. Coping skills, problem solving as well as relaxation therapies were also part of the unit programming.  Labs were reviewed with the patient, and abnormal results were discussed with the patient.  The patient is able to verbalize their individual safety plan to this provider.  # It is recommended to the patient to continue psychiatric medications as prescribed, after discharge from the hospital.    # It is recommended to the patient to follow up with your outpatient psychiatric provider and PCP.  # It was discussed with the patient, the impact of alcohol, drugs, tobacco have been there overall psychiatric and medical wellbeing, and total abstinence from substance use was recommended the patient.ed.  # Prescriptions provided or sent directly to preferred pharmacy at discharge. Patient agreeable to plan. Given opportunity to ask questions. Appears to feel comfortable with discharge.    # In the event of worsening symptoms, the patient is instructed to call the crisis hotline,  911 and or go to the nearest ED for appropriate evaluation and treatment of symptoms. To follow-up with primary care provider for other medical issues, concerns and or health care needs  # Patient was discharged to home with parents, with a plan to follow up as noted below.   Physical Findings: AIMS:  , ,  ,  ,    CIWA:    COWS:     Aims score zero on my exam. No eps on my  exam.   Musculoskeletal: Strength & Muscle Tone: within normal limits Gait & Station: normal Patient leans: N/A   Psychiatric Specialty Exam:  Presentation  General Appearance:  Appropriate for Environment; Casual; Fairly Groomed  Eye Contact: Good  Speech: Normal Rate; Clear and Coherent  Speech Volume: Normal  Handedness: Right   Mood and Affect  Mood: Euthymic  Affect: Appropriate; Congruent; Full Range   Thought Process  Thought Processes: Linear  Descriptions of Associations:Intact  Orientation:Full (Time, Place and Person)  Thought Content:Logical  History of Schizophrenia/Schizoaffective disorder:Yes  Duration of Psychotic Symptoms:No data recorded Hallucinations:Hallucinations: None  Ideas of Reference:None  Suicidal Thoughts:Suicidal Thoughts: No  Homicidal Thoughts:Homicidal Thoughts: No   Sensorium  Memory: Immediate Good; Recent Good; Remote Good  Judgment: Fair  Insight: Fair   Community education officer  Concentration: Fair  Attention Span: Fair  Recall: Good  Fund of Knowledge: Good  Language: Good   Psychomotor Activity  Psychomotor Activity: Psychomotor Activity: Normal   Assets  Assets: Physical Health; Resilience; Social Support; Housing   Sleep  Sleep: Sleep: Fair    Physical Exam: Physical Exam Vitals reviewed.  Constitutional:      General: He is not in acute distress.    Appearance: He is normal weight. He is not toxic-appearing.  Pulmonary:     Effort: Pulmonary effort is normal. No respiratory distress.  Neurological:     Mental Status: He is alert.     Motor: No weakness.     Gait: Gait normal.  Psychiatric:        Mood and Affect: Mood normal.        Behavior: Behavior normal.        Thought Content: Thought content normal.        Judgment: Judgment normal.    Review of Systems  Constitutional:  Negative for chills and fever.  Cardiovascular:  Negative for chest pain and  palpitations.  Neurological:  Negative for dizziness, tingling, tremors and headaches.  Psychiatric/Behavioral:  Negative for depression, hallucinations, memory loss, substance abuse and suicidal ideas. The patient is not nervous/anxious and does not have insomnia.   All other systems reviewed and are negative.  Blood pressure 104/73, pulse 90, temperature (!) 97.5 F (36.4 C), temperature source Oral, resp. rate 18, height 5\' 10"  (1.778 m), weight 79.8 kg, SpO2 97 %. Body mass index is 25.25 kg/m.   Social History   Tobacco Use  Smoking Status Former   Packs/day: .1   Types: E-cigarettes, Cigarettes  Smokeless Tobacco Never   Tobacco Cessation:  A prescription for an FDA-approved tobacco cessation medication provided at discharge   Blood Alcohol level:  Lab Results  Component Value Date   [facility] <10 05/29/2022   ETH <10 [id number]    Metabolic Disorder Labs:  Lab Results  Component Value Date   HGBA1C 5.5 06/01/2022   MPG 111 06/01/2022   MPG 108.28 06/05/2017   Lab Results  Component Value Date   PROLACTIN 53.0 (H) 06/05/2017   PROLACTIN 22.9 (H) 03/25/2015  Lab Results  Component Value Date   CHOL 187 06/01/2022   TRIG 99 06/01/2022   HDL 57 06/01/2022   CHOLHDL 3.3 06/01/2022   VLDL 20 06/01/2022   LDLCALC 110 (H) 06/01/2022   LDLCALC 74 06/05/2017    See Psychiatric Specialty Exam and Suicide Risk Assessment completed by Attending Physician prior to discharge.  Discharge destination:  Home  Is patient on multiple antipsychotic therapies at discharge:  No   Has Patient had three or more failed trials of antipsychotic monotherapy by history:  No  Recommended Plan for Multiple Antipsychotic Therapies: NA  Discharge Instructions     Diet - low sodium heart healthy   Complete by: As directed    Increase activity slowly   Complete by: As directed       Allergies as of 06/04/2022       Reactions   Cymbalta [duloxetine Hcl]    "makes him feel awful"         Medication List     STOP taking these medications    ibuprofen 800 MG tablet Commonly known as: ADVIL       TAKE these medications      Indication  benztropine 0.5 MG tablet Commonly known as: COGENTIN Take 1 tablet (0.5 mg total) by mouth 2 (two) times daily. What changed: additional instructions  Indication: Extrapyramidal Reaction caused by Medications   hydrOXYzine 50 MG tablet Commonly known as: ATARAX Take 1 tablet (50 mg total) by mouth 3 (three) times daily as needed for anxiety.  Indication: Feeling Anxious   nicotine 21 mg/24hr patch Commonly known as: NICODERM CQ - dosed in mg/24 hours Place 1 patch (21 mg total) onto the skin daily. Start taking on: June 05, 2022  Indication: Nicotine Addiction   risperiDONE 1 MG tablet Commonly known as: RISPERDAL Take 1 tablet (1 mg total) by mouth as directed for 4 doses. Your risperdal (oral) taper is as follows: Take riserpdal 1 mg at bedtime for 3 doses. Then decrease risperdal to 0.5 mg (which is 1/2 tablet) at bedtime for 2 doses. Then stop the risperdal. What changed:  when to take this additional instructions  Indication: Schizophrenia   traZODone 50 MG tablet Commonly known as: DESYREL Take 1 tablet (50 mg total) by mouth at bedtime as needed for sleep.  Indication: [place] Follow up.   Contact information: [place] [address] [place] at [facility] Follow up.   Specialty: [facility] information: [place] [phone]                Follow-up recommendations:    Activity: as tolerated  Diet: heart healthy  Other: -Follow-up with your outpatient psychiatric provider -instructions on appointment date, time, and address (location) are provided to you in  discharge paperwork.  -Take your psychiatric medications as prescribed at discharge - instructions are provided to you in the discharge paperwork  Your risperdal (oral) taper is as follows:  Take riserpdal 1 mg at bedtime for 3 doses Then decrease risperdal to 0.5 mg (which is 1/2 tablet) at bedtime for 2 doses Then stop the risperdal.   Your next dose of Risperdal Uzedy 50 mg LAI is due on 07-03-2022.  -Follow-up with outpatient primary care doctor and  other specialists -for management of preventative medicine and chronic medical disease.  -Recommend abstinence from alcohol, tobacco, and other illicit drug use at discharge.   -If your psychiatric symptoms recur, worsen, or if you have side effects to your psychiatric medications, call your outpatient psychiatric provider, 911, [phone] or go to the nearest emergency department.  -If suicidal thoughts recur, call your outpatient psychiatric provider, 911, [phone] or go to the nearest emergency department.   Signed: Christoper Allegra, MD 06/04/2022, 9:02 AM  Total Time Spent in Direct Patient Care:  I personally spent 35 minutes on the unit in direct patient care. The direct patient care time included face-to-face time with the patient, reviewing the patient's chart, communicating with other professionals, and coordinating care. Greater than 50% of this time was spent in counseling or coordinating care with the patient regarding goals of hospitalization, psycho-education, and discharge planning needs.   Janine Limbo, MD Psychiatrist

## 2022-06-04 NOTE — Plan of Care (Signed)
  Problem: Education: Goal: Knowledge of General Education information will improve Description: Including pain rating scale, medication(s)/side effects and non-pharmacologic comfort measures Outcome: Progressing   Problem: Clinical Measurements: Goal: Ability to maintain clinical measurements within normal limits will improve Outcome: Progressing Goal: Will remain free from infection Outcome: Progressing   Problem: Coping: Goal: Level of anxiety will decrease Outcome: Progressing   Problem: Pain Managment: Goal: General experience of comfort will improve Outcome: Progressing   Problem: Education: Goal: Knowledge of the prescribed therapeutic regimen will improve Outcome: Progressing   Problem: Coping: Goal: Coping ability will improve Outcome: Progressing   Problem: Health Behavior/Discharge Planning: Goal: Ability to make decisions will improve Outcome: Progressing

## 2022-06-04 NOTE — Progress Notes (Signed)
  [facility] Adult Case Management Discharge Plan :  Will you be returning to the same living situation after discharge:  Yes,  home  At discharge, do you have transportation home?: Yes,  mother will be picking patient up Do you have the ability to pay for your medications: Yes,  insurance   Release of information consent forms completed and in the chart;  Patient's signature needed at discharge.  Patient to Follow up at:  Follow-up Information     [facility] Follow up.   Why: We tried to get an appointment with this provider for psychiatry.  We were unable to connect to get an appointment.  Please call to get connected for another option to get connected to psychiatry. Contact information: [place] [phone]         [facility] Call.   Why: Discharge paperwork and referral placed on our behalf.  Please call to schedule an assessment and get connected to services.  Please follow recommendations by provider for mental health needs. Contact information: (ph) [phone] ([phone] [place], [place] [address]        [facility] Follow up.   Why: You have an assessment to get connected to Psychiatry and therapy services.  Your appointment is virtual at this time.  It is on April 8th at 11:30am.  They will send you a link via text message to get connected.  Please call if you need an in person appointment. Contact information: [place] [address] [phone]                 Next level of care provider has access to [place] and Suicide Prevention discussed: Yes,  Mother, Butch Penny     Has patient been referred to the Quitline?: Patient refused referral  Patient has been referred for addiction treatment: [place], LCSW 06/04/2022, 10:16 AM

## 2022-06-04 NOTE — Progress Notes (Signed)
   06/03/22 2130  Psych Admission Type (Psych Patients Only)  Admission Status Voluntary  Psychosocial Assessment  Patient Complaints None  Eye Contact Fair  Facial Expression Animated  Affect Appropriate to circumstance  Speech Logical/coherent  Interaction Assertive  Motor Activity Other (Comment)  Appearance/Hygiene Unremarkable  Behavior Characteristics Cooperative;Appropriate to situation  Mood Pleasant  Aggressive Behavior  Effect No apparent injury  Thought Process  Coherency WDL  Content WDL  Delusions None reported or observed  Perception WDL  Hallucination None reported or observed  Judgment Impaired  Confusion None  Danger to Self  Current suicidal ideation? Denies  Danger to Others  Danger to Others None reported or observed  Danger to Others Abnormal  Harmful Behavior to others No threats or harm toward other people  Destructive Behavior No threats or harm toward property

## 2022-06-04 NOTE — [facility] Suicide Risk Assessment (Signed)
[facility] Discharge Suicide Risk Assessment   Principal Problem: Schizophrenia Discharge Diagnoses: Principal Problem:   Schizophrenia Active Problems:   Cannabis use disorder   Tobacco use disorder   Total Time spent with patient: 15 minutes  Gilbert Meyers is a 37 yo male with a PPHx of schizophrenia,GAD, and cannabis use disorder with 4 prior inpatient hospitalizations, presenting voluntarily to [facility] for worsening agitation, paranoia, and psychosis in the setting of medication nonadherence and cannabis use.   During the patient's hospitalization, patient had extensive initial psychiatric evaluation, and follow-up psychiatric evaluations every day.   Psychiatric diagnoses provided upon initial assessment: Schizophrenia Cannabis use disorder Tobacco use disorder   Patient's psychiatric medications were adjusted on admission:  Restart home Risperdal 1 mg Q12Hrs for paranoia and psychosis Ideally, transitioning to [place] would be appropriate given patient's hx of noncompliance. Restart home Cogentin 0.5 mg BID for EPS ppx   During the hospitalization, other adjustments were made to the patient's psychiatric medication regimen:  -Risperdal oral was incr to 1 mg bid -Risperdal Uzedy 50 mg LAI was given on 06-02-2022    Psychiatric Specialty Exam  Presentation  General Appearance:  Appropriate for Environment; Casual; Fairly Groomed  Eye Contact: Good  Speech: Normal Rate; Clear and Coherent  Speech Volume: Normal  Handedness: Right   Mood and Affect  Mood: Euthymic  Duration of Depression Symptoms: No data recorded Affect: Appropriate; Congruent; Full Range   Thought Process  Thought Processes: Linear  Descriptions of Associations:Intact  Orientation:Full (Time, Place and Person)  Thought Content:Logical  History of Schizophrenia/Schizoaffective disorder:Yes  Duration of Psychotic Symptoms:No data recorded Hallucinations:Hallucinations: None  Ideas of  Reference:None  Suicidal Thoughts:Suicidal Thoughts: No  Homicidal Thoughts:Homicidal Thoughts: No   Sensorium  Memory: Immediate Good; Recent Good; Remote Good  Judgment: Fair  Insight: Fair   Community education officer  Concentration: Fair  Attention Span: Fair  Recall: Good  Fund of Knowledge: Good  Language: Good   Psychomotor Activity  Psychomotor Activity: Psychomotor Activity: Normal   Assets  Assets: Physical Health; Resilience; Social Support; Housing   Sleep  Sleep: Sleep: Fair   Physical Exam: Physical Exam See discharge summary  ROS See discharge summary  Blood pressure 104/73, pulse 90, temperature (!) 97.5 F (36.4 C), temperature source Oral, resp. rate 18, height 5\' 10"  (1.778 m), weight 79.8 kg, SpO2 97 %. Body mass index is 25.25 kg/m.  Mental Status Per Nursing Assessment::   On Admission:  NA  Demographic factors:  Male, Caucasian Loss Factors:  Financial problems / change in socioeconomic status Historical Factors:  NA Risk Reduction Factors:  Living with another person, especially a relative, Positive social support  Continued Clinical Symptoms:  Schizophrenia - psychosis is stable  Cognitive Features That Contribute To Risk:  None    Suicide Risk:  Mild:  There are no identifiable suicide plans, no associated intent, mild dysphoria and related symptoms, good self-control (both objective and subjective assessment), few other risk factors, and identifiable protective factors, including available and accessible social support.    Follow-up Information     [facility] Follow up.   Contact information: [place] [id number] [address] at [facility] Follow up.   Specialty: Behavioral Health Contact information: [place]  Care/Follow-up recommendations:   Activity: as tolerated   Diet: heart healthy   Other: -Follow-up with your outpatient psychiatric provider -instructions on appointment date, time, and address (location) are provided to you in discharge paperwork.   -Take your psychiatric medications as prescribed at discharge - instructions are provided to you in the discharge paperwork   Your risperdal (oral) taper is as follows:  Take riserpdal 1 mg at bedtime for 3 doses Then decrease risperdal to 0.5 mg (which is 1/2 tablet) at bedtime for 2 doses Then stop the risperdal.  Your next dose of Risperdal Uzedy 50 mg LAI is due on 07-03-2022. If psychosis recurs, contact your outpatient psychiatrist.  -Follow-up with outpatient primary care doctor and other specialists -for management of preventative medicine and chronic medical disease.   -Recommend abstinence from alcohol, tobacco, and other illicit drug use at discharge.    -If your psychiatric symptoms recur, worsen, or if you have side effects to your psychiatric medications, call your outpatient psychiatric provider, 911, [phone] or go to the nearest emergency department.   -If suicidal thoughts recur, call your outpatient psychiatric provider, 911, [phone] or go to the nearest emergency department.    Christoper Allegra, MD 06/04/2022, 9:13 AM

## 2022-06-04 NOTE — Discharge Instructions (Addendum)
-  Follow-up with your outpatient psychiatric provider -instructions on appointment date, time, and address (location) are provided to you in discharge paperwork.  -Take your psychiatric medications as prescribed at discharge - instructions are provided to you in the discharge paperwork  Your risperdal (oral) taper is as follows:  Take riserpdal 1 mg at bedtime for 3 doses Then decrease risperdal to 0.5 mg (which is 1/2 tablet) at bedtime for 2 doses Then stop the risperdal.   Your next dose of Risperdal Uzedy 50 mg LAI is due on 07-03-2022.   If psychosis recurs, contact your outpatient psychiatrist.  -Follow-up with outpatient primary care doctor and other specialists -for management of preventative medicine and any chronic medical disease.  -Recommend abstinence from alcohol, tobacco, and other illicit drug use at discharge.   -If your psychiatric symptoms recur, worsen, or if you have side effects to your psychiatric medications, call your outpatient psychiatric provider, 911, [phone] or go to the nearest emergency department.  -If suicidal thoughts occur, call your outpatient psychiatric provider, 911, 988 or go to the nearest emergency department.  Naloxone (Narcan) can help reverse an overdose when given to the victim quickly.  [facility] offers free naloxone kits and instructions/training on its use.  Add naloxone to your first aid kit and you can help save a life.   Pick up your free kit at the following locations:   [place]:  [place], [place] [address] [phone]) [facility] [place] [id number] [phone]) [facility] [place]  [place]: [place] [id number] [place] [id number] [phone]) [facility] [place] [address] [phone])

## 2022-06-04 NOTE — Progress Notes (Signed)
Discharge note: RN met with pt and reviewed pt's discharge instructions. Pt verbalized understanding of discharge instructions and pt did not have any questions. RN reviewed and provided pt with a copy of Suicide Safety Plan, SRA, AVS and Transition Record. RN returned pt's belongings to pt. Pt denied SI/HI/AVH and voiced no concerns. Pt was appreciative of the care pt received at [facility]. Patient discharged to the lobby without incident. 

## 2022-06-04 NOTE — Group Note (Signed)
Date:  06/04/2022 Time:  10:58 AM  Group Topic/Focus:  Goals Group:   The focus of this group is to help patients establish daily goals to achieve during treatment and discuss how the patient can incorporate goal setting into their daily lives to aide in recovery.    Participation Level:  Active  Participation Quality:  Appropriate  Affect:  Appropriate  Cognitive:  Appropriate  Insight: Appropriate  Engagement in Group:  Engaged  Modes of Intervention:  Education  Additional Comments:     Jerrye Beavers 06/04/2022, 10:58 AM

## 2023-12-24 ENCOUNTER — Other Ambulatory Visit (HOSPITAL_COMMUNITY): Payer: Self-pay

## 2023-12-24 ENCOUNTER — Other Ambulatory Visit: Payer: Self-pay

## 2023-12-24 ENCOUNTER — Other Ambulatory Visit (HOSPITAL_BASED_OUTPATIENT_CLINIC_OR_DEPARTMENT_OTHER): Payer: Self-pay

## 2023-12-24 MED ORDER — RISPERIDONE ER 100 MG/0.28ML ~~LOC~~ SUSY
100.0000 mg | PREFILLED_SYRINGE | Freq: Once | SUBCUTANEOUS | 0 refills | Status: AC
  Filled 2023-12-24: qty 0.28, 30d supply, fill #0
